# Patient Record
Sex: Male | Born: 1940
Health system: Southern US, Community
[De-identification: ages and names within clinical notes are randomized; demographics above are authoritative.]

## PROBLEM LIST (undated history)

## (undated) DIAGNOSIS — E039 Hypothyroidism, unspecified: Secondary | ICD-10-CM

## (undated) DIAGNOSIS — F319 Bipolar disorder, unspecified: Secondary | ICD-10-CM

## (undated) DIAGNOSIS — C801 Malignant (primary) neoplasm, unspecified: Secondary | ICD-10-CM

## (undated) DIAGNOSIS — G43909 Migraine, unspecified, not intractable, without status migrainosus: Secondary | ICD-10-CM

## (undated) DIAGNOSIS — I10 Essential (primary) hypertension: Secondary | ICD-10-CM

## (undated) DIAGNOSIS — R011 Cardiac murmur, unspecified: Secondary | ICD-10-CM

## (undated) DIAGNOSIS — L409 Psoriasis, unspecified: Secondary | ICD-10-CM

## (undated) DIAGNOSIS — M109 Gout, unspecified: Secondary | ICD-10-CM

## (undated) DIAGNOSIS — D693 Immune thrombocytopenic purpura: Secondary | ICD-10-CM

## (undated) HISTORY — DX: Cardiac murmur, unspecified: R01.1

## (undated) HISTORY — PX: CARDIAC SURGERY: SHX584

## (undated) HISTORY — DX: Essential (primary) hypertension: I10

## (undated) HISTORY — PX: CHOLECYSTECTOMY: SHX55

## (undated) HISTORY — DX: Psoriasis, unspecified: L40.9

## (undated) HISTORY — DX: Gout, unspecified: M10.9

## (undated) HISTORY — DX: Bipolar disorder, unspecified: F31.9

## (undated) HISTORY — DX: Immune thrombocytopenic purpura: D69.3

## (undated) HISTORY — PX: LITHOTRIPSY: SUR834

## (undated) HISTORY — DX: Migraine, unspecified, not intractable, without status migrainosus: G43.909

## (undated) HISTORY — DX: Hypothyroidism, unspecified: E03.9

## (undated) HISTORY — DX: Malignant (primary) neoplasm, unspecified: C80.1

---

## 1996-04-09 HISTORY — PX: MELANOMA EXCISION: SHX5266

## 1999-04-10 HISTORY — PX: HEMORRHOID SURGERY: SHX153

## 2006-06-10 ENCOUNTER — Ambulatory Visit: Payer: Self-pay | Admitting: Specialist

## 2007-09-12 ENCOUNTER — Ambulatory Visit: Payer: Self-pay | Admitting: General Surgery

## 2007-09-12 HISTORY — PX: COLONOSCOPY W/ POLYPECTOMY: SHX1380

## 2009-08-05 ENCOUNTER — Ambulatory Visit: Payer: Self-pay | Admitting: Gastroenterology

## 2010-05-18 ENCOUNTER — Ambulatory Visit: Payer: Self-pay | Admitting: Family Medicine

## 2012-09-16 ENCOUNTER — Ambulatory Visit: Payer: Self-pay | Admitting: General Surgery

## 2013-04-29 DIAGNOSIS — E039 Hypothyroidism, unspecified: Secondary | ICD-10-CM | POA: Diagnosis not present

## 2013-04-29 DIAGNOSIS — I10 Essential (primary) hypertension: Secondary | ICD-10-CM | POA: Diagnosis not present

## 2013-04-29 DIAGNOSIS — E785 Hyperlipidemia, unspecified: Secondary | ICD-10-CM | POA: Diagnosis not present

## 2013-06-29 DIAGNOSIS — E785 Hyperlipidemia, unspecified: Secondary | ICD-10-CM | POA: Diagnosis not present

## 2013-06-29 DIAGNOSIS — E039 Hypothyroidism, unspecified: Secondary | ICD-10-CM | POA: Diagnosis not present

## 2013-06-29 DIAGNOSIS — M109 Gout, unspecified: Secondary | ICD-10-CM | POA: Diagnosis not present

## 2013-06-29 DIAGNOSIS — I10 Essential (primary) hypertension: Secondary | ICD-10-CM | POA: Diagnosis not present

## 2013-07-24 DIAGNOSIS — I359 Nonrheumatic aortic valve disorder, unspecified: Secondary | ICD-10-CM | POA: Diagnosis not present

## 2013-07-28 DIAGNOSIS — I359 Nonrheumatic aortic valve disorder, unspecified: Secondary | ICD-10-CM | POA: Diagnosis not present

## 2013-12-10 DIAGNOSIS — E785 Hyperlipidemia, unspecified: Secondary | ICD-10-CM | POA: Diagnosis not present

## 2013-12-10 DIAGNOSIS — M109 Gout, unspecified: Secondary | ICD-10-CM | POA: Diagnosis not present

## 2013-12-10 DIAGNOSIS — I10 Essential (primary) hypertension: Secondary | ICD-10-CM | POA: Diagnosis not present

## 2013-12-10 DIAGNOSIS — E039 Hypothyroidism, unspecified: Secondary | ICD-10-CM | POA: Diagnosis not present

## 2014-01-05 DIAGNOSIS — G589 Mononeuropathy, unspecified: Secondary | ICD-10-CM | POA: Diagnosis not present

## 2014-01-05 DIAGNOSIS — E039 Hypothyroidism, unspecified: Secondary | ICD-10-CM | POA: Diagnosis not present

## 2014-01-05 DIAGNOSIS — I10 Essential (primary) hypertension: Secondary | ICD-10-CM | POA: Diagnosis not present

## 2014-01-05 DIAGNOSIS — M109 Gout, unspecified: Secondary | ICD-10-CM | POA: Diagnosis not present

## 2014-04-09 LAB — HM COLONOSCOPY

## 2014-07-15 DIAGNOSIS — I10 Essential (primary) hypertension: Secondary | ICD-10-CM | POA: Diagnosis not present

## 2014-07-15 DIAGNOSIS — E039 Hypothyroidism, unspecified: Secondary | ICD-10-CM | POA: Diagnosis not present

## 2014-07-15 DIAGNOSIS — M109 Gout, unspecified: Secondary | ICD-10-CM | POA: Diagnosis not present

## 2014-07-19 DIAGNOSIS — I10 Essential (primary) hypertension: Secondary | ICD-10-CM | POA: Diagnosis not present

## 2014-07-19 DIAGNOSIS — N189 Chronic kidney disease, unspecified: Secondary | ICD-10-CM | POA: Diagnosis not present

## 2014-07-19 DIAGNOSIS — N200 Calculus of kidney: Secondary | ICD-10-CM | POA: Diagnosis not present

## 2014-07-20 DIAGNOSIS — N200 Calculus of kidney: Secondary | ICD-10-CM | POA: Diagnosis not present

## 2014-07-26 DIAGNOSIS — M791 Myalgia: Secondary | ICD-10-CM | POA: Diagnosis not present

## 2014-07-26 DIAGNOSIS — E039 Hypothyroidism, unspecified: Secondary | ICD-10-CM | POA: Diagnosis not present

## 2014-07-26 DIAGNOSIS — N183 Chronic kidney disease, stage 3 (moderate): Secondary | ICD-10-CM | POA: Diagnosis not present

## 2014-07-30 DIAGNOSIS — N189 Chronic kidney disease, unspecified: Secondary | ICD-10-CM | POA: Diagnosis not present

## 2014-07-30 DIAGNOSIS — I10 Essential (primary) hypertension: Secondary | ICD-10-CM | POA: Diagnosis not present

## 2014-08-09 ENCOUNTER — Encounter: Payer: Self-pay | Admitting: *Deleted

## 2014-08-16 ENCOUNTER — Other Ambulatory Visit: Payer: Self-pay

## 2014-08-16 DIAGNOSIS — Z1211 Encounter for screening for malignant neoplasm of colon: Secondary | ICD-10-CM

## 2014-08-16 MED ORDER — POLYETHYLENE GLYCOL 3350 17 GM/SCOOP PO POWD: 1.0000 | Freq: Once | ORAL | Status: DC

## 2014-08-16 NOTE — Progress Notes (Signed)
Spoke with patient's wife, Madox Corkins, and reviewed all patient's medical history, allergies, and medications. Patient is scheduled for a Colonoscopy at [facility] on 08/31/14. He will be seen here in the office by Dr Bary Castilla on 08/30/14 at 9:30 am. Patient is aware to pre register with the hospital at least 2 days prior to colonoscopy. He will only take his Benazepril and Metoprolol the morning of. Miralax prescription has been sent into his pharmacy. Colonoscopy instructions have been mailed to the patient. Patient is aware of dates, time, and instructions.

## 2014-08-26 ENCOUNTER — Encounter: Payer: Self-pay | Admitting: General Surgery

## 2014-08-30 ENCOUNTER — Ambulatory Visit (INDEPENDENT_AMBULATORY_CARE_PROVIDER_SITE_OTHER): Payer: Medicare Other | Admitting: General Surgery

## 2014-08-30 ENCOUNTER — Other Ambulatory Visit: Payer: Self-pay | Admitting: General Surgery

## 2014-08-30 ENCOUNTER — Encounter: Payer: Self-pay | Admitting: General Surgery

## 2014-08-30 VITALS — BP 124/70 | HR 76 | Resp 12 | Ht 70.0 in | Wt 192.0 lb

## 2014-08-30 DIAGNOSIS — Z86010 Personal history of colonic polyps: Secondary | ICD-10-CM

## 2014-08-30 NOTE — H&P (Signed)
Patient ID: JOESEPH VERVILLE, male DOB: 12/08/1940, 74 y.o. MRN: [id number]  Chief Complaint  Patient presents with  . Other    Colonoscopy    HPI Dalton Roy is a 74 y.o. male here for follow up colonoscopy last one done in 2009. Patient states no new GI problems at this time. Bowels move daily. Patient has been working in [place], [place] since 2012.   HPI  Past Medical History  Diagnosis Date  . Hypertension   . Heart murmur   . Gout   . Migraines   . Thyroid disease     Past Surgical History  Procedure Laterality Date  . Melanoma excision  1998  . Hemorrhoid surgery  2001  . Lithotripsy    . Colonoscopy w/ polypectomy  09/12/2007    Tubular adenoma from the proximal transverse colon without atypia.    Family History  Problem Relation Age of Onset  . Colon cancer Brother   . Cancer Father     bladder cancer    Social History History  Substance Use Topics  . Smoking status: Former Smoker    Types: Cigarettes    Quit date: 04/09/1966  . Smokeless tobacco: Not on file  . Alcohol Use: Yes    No Known Allergies  Current Outpatient Prescriptions  Medication Sig Dispense Refill  . allopurinol (ZYLOPRIM) 300 MG tablet Take 300 mg by mouth daily.    Marland Kitchen ascorbic acid (VITAMIN C) 1000 MG tablet Take 1,000 mg by mouth daily.    Marland Kitchen aspirin 81 MG tablet Take 81 mg by mouth daily.    . benazepril (LOTENSIN) 40 MG tablet Take 40 mg by mouth 2 (two) times daily.    . cholecalciferol (VITAMIN D) 1000 UNITS tablet Take 1,000 Units by mouth daily.    . divalproex (DEPAKOTE) 500 MG DR tablet Take 500 mg by mouth 2 (two) times daily.     . ergotamine-caffeine (CAFERGOT) 1-100 MG per tablet Take 1 tablet by mouth every hour as needed for headache or migraine. Two tablets at onset of attack; then 1 tablet every 30 minutes as needed; maximum: 6 tablets per  attack; do not exceed 10 tablets/week    . levothyroxine (SYNTHROID, LEVOTHROID) 75 MCG tablet Take 32.5 mcg by mouth daily before breakfast.    . metoprolol succinate (TOPROL-XL) 50 MG 24 hr tablet Take 50 mg by mouth daily. Take with or immediately following a meal.    . Multiple Vitamin (MULTI VITAMIN DAILY PO) Take by mouth.    . Multiple Vitamins-Minerals (HEALTHY EYES PO) Take by mouth.    . Omega-3 Fatty Acids (FISH OIL) 1000 MG CAPS Take by mouth.    Marland Kitchen omeprazole (PRILOSEC) 20 MG capsule Take 20 mg by mouth 2 (two) times daily before a meal.    . polyethylene glycol powder (GLYCOLAX/MIRALAX) powder Take 255 g by mouth once. 255 g 0  . simvastatin (ZOCOR) 20 MG tablet Take 20 mg by mouth daily.    . verapamil (VERELAN PM) 240 MG 24 hr capsule Take 240 mg by mouth at bedtime.     No current facility-administered medications for this visit.    Review of Systems Review of Systems  Constitutional: Negative.  Respiratory: Negative.  Cardiovascular: Negative.  Gastrointestinal: Negative.    Blood pressure 124/70, pulse 76, resp. rate 12, height 5\' 10"  (1.778 m), weight 192 lb (87.091 kg).  Physical Exam Physical Exam  Constitutional: He is oriented to person, place, and time. He appears well-developed and  well-nourished.  Eyes: Pupils are equal, round, and reactive to light.  Cardiovascular: Normal rate.  Murmur heard. Pulmonary/Chest: Effort normal and breath sounds normal.  Neurological: He is alert and oriented to person, place, and time.  Skin: Skin is warm and dry.    Data Reviewed 2009 Colonoscopy and Pathology notes.  Assessment    Previous colonic polyp. No recent GI symptoms.    Plan    Colonoscopy with possible biopsy/polypectomy prn: Information regarding the procedure, including its potential risks and complications (including but not limited to perforation of the bowel, which may require emergency  surgery to repair, and bleeding) was verbally given to the patient. Educational information regarding lower instestinal endoscopy was given to the patient. Written instructions for how to complete the bowel prep using Miralax were provided. The importance of drinking ample fluids to avoid dehydration as a result of the prep emphasized.      Patient has been scheduled for a colonoscopy on 08-31-14 at [facility].  KLT:YVDPBA Golden Pop, MD

## 2014-08-30 NOTE — Progress Notes (Signed)
Patient ID: Dalton Roy, male   DOB: 1941-04-08, 74 y.o.   MRN: [id number]  Chief Complaint  Patient presents with  . Other    Colonoscopy    HPI Dalton Roy is a 74 y.o. male here for follow up colonoscopy last one done in 2009. Patient states no new GI problems at this time. Bowels move daily. Patient has been working in [place], [place] since 2012.   HPI  Past Medical History  Diagnosis Date  . Hypertension   . Heart murmur   . Gout   . Migraines   . Thyroid disease     Past Surgical History  Procedure Laterality Date  . Melanoma excision  1998  . Hemorrhoid surgery  2001  . Lithotripsy    . Colonoscopy w/ polypectomy  09/12/2007    Tubular adenoma from the proximal transverse colon without atypia.    Family History  Problem Relation Age of Onset  . Colon cancer Brother   . Cancer Father     bladder cancer    Social History History  Substance Use Topics  . Smoking status: Former Smoker    Types: Cigarettes    Quit date: 04/09/1966  . Smokeless tobacco: Not on file  . Alcohol Use: Yes    No Known Allergies  Current Outpatient Prescriptions  Medication Sig Dispense Refill  . allopurinol (ZYLOPRIM) 300 MG tablet Take 300 mg by mouth daily.    Marland Kitchen ascorbic acid (VITAMIN C) 1000 MG tablet Take 1,000 mg by mouth daily.    Marland Kitchen aspirin 81 MG tablet Take 81 mg by mouth daily.    . benazepril (LOTENSIN) 40 MG tablet Take 40 mg by mouth 2 (two) times daily.    . cholecalciferol (VITAMIN D) 1000 UNITS tablet Take 1,000 Units by mouth daily.    . divalproex (DEPAKOTE) 500 MG DR tablet Take 500 mg by mouth 2 (two) times daily.     . ergotamine-caffeine (CAFERGOT) 1-100 MG per tablet Take 1 tablet by mouth every hour as needed for headache or migraine. Two tablets at onset of attack; then 1 tablet every 30 minutes as needed; maximum: 6 tablets per attack; do not exceed 10 tablets/week    . levothyroxine (SYNTHROID, LEVOTHROID) 75 MCG tablet Take 32.5 mcg by mouth daily  before breakfast.    . metoprolol succinate (TOPROL-XL) 50 MG 24 hr tablet Take 50 mg by mouth daily. Take with or immediately following a meal.    . Multiple Vitamin (MULTI VITAMIN DAILY PO) Take by mouth.    . Multiple Vitamins-Minerals (HEALTHY EYES PO) Take by mouth.    . Omega-3 Fatty Acids (FISH OIL) 1000 MG CAPS Take by mouth.    Marland Kitchen omeprazole (PRILOSEC) 20 MG capsule Take 20 mg by mouth 2 (two) times daily before a meal.    . polyethylene glycol powder (GLYCOLAX/MIRALAX) powder Take 255 g by mouth once. 255 g 0  . simvastatin (ZOCOR) 20 MG tablet Take 20 mg by mouth daily.    . verapamil (VERELAN PM) 240 MG 24 hr capsule Take 240 mg by mouth at bedtime.     No current facility-administered medications for this visit.    Review of Systems Review of Systems  Constitutional: Negative.   Respiratory: Negative.   Cardiovascular: Negative.   Gastrointestinal: Negative.     Blood pressure 124/70, pulse 76, resp. rate 12, height $RemoveBe'5\' 10"'yMaAVUpbZ$  (1.778 m), weight 192 lb (87.091 kg).  Physical Exam Physical Exam  Constitutional: He is oriented to  person, place, and time. He appears well-developed and well-nourished.  Eyes: Pupils are equal, round, and reactive to light.  Cardiovascular: Normal rate.   Murmur heard. Pulmonary/Chest: Effort normal and breath sounds normal.  Neurological: He is alert and oriented to person, place, and time.  Skin: Skin is warm and dry.    Data Reviewed 2009 Colonoscopy and Pathology notes.  Assessment    Previous colonic polyp. No recent GI symptoms.    Plan    Colonoscopy with possible biopsy/polypectomy prn: Information regarding the procedure, including its potential risks and complications (including but not limited to perforation of the bowel, which may require emergency surgery to repair, and bleeding) was verbally given to the patient. Educational information regarding lower instestinal endoscopy was given to the patient. Written instructions for  how to complete the bowel prep using Miralax were provided. The importance of drinking ample fluids to avoid dehydration as a result of the prep emphasized.      Patient has been scheduled for a colonoscopy on 08-31-14 at [facility].  YQI:HKVQQV Golden Pop, MD  Robert Bellow 08/30/2014, 2:55 PM

## 2014-08-30 NOTE — Patient Instructions (Addendum)
Colonoscopy A colonoscopy is an exam to look at the entire large intestine (colon). This exam can help find problems such as tumors, polyps, inflammation, and areas of bleeding. The exam takes about 1 hour.  LET [facility] CARE PROVIDER KNOW ABOUT:   Any allergies you have.  All medicines you are taking, including vitamins, herbs, eye drops, creams, and over-the-counter medicines.  Previous problems you or members of your family have had with the use of anesthetics.  Any blood disorders you have.  Previous surgeries you have had.  Medical conditions you have. RISKS AND COMPLICATIONS  Generally, this is a safe procedure. However, as with any procedure, complications can occur. Possible complications include:  Bleeding.  Tearing or rupture of the colon wall.  Reaction to medicines given during the exam.  Infection (rare). BEFORE THE PROCEDURE   Ask your health care provider about changing or stopping your regular medicines.  You may be prescribed an oral bowel prep. This involves drinking a large amount of medicated liquid, starting the day before your procedure. The liquid will cause you to have multiple loose stools until your stool is almost clear or light green. This cleans out your colon in preparation for the procedure.  Do not eat or drink anything else once you have started the bowel prep, unless your health care provider tells you it is safe to do so.  Arrange for someone to drive you home after the procedure. PROCEDURE   You will be given medicine to help you relax (sedative).  You will lie on your side with your knees bent.  A long, flexible tube with a light and camera on the end (colonoscope) will be inserted through the rectum and into the colon. The camera sends video back to a computer screen as it moves through the colon. The colonoscope also releases carbon dioxide gas to inflate the colon. This helps your health care provider see the area better.  During  the exam, your health care provider may take a small tissue sample (biopsy) to be examined under a microscope if any abnormalities are found.  The exam is finished when the entire colon has been viewed. AFTER THE PROCEDURE   Do not drive for 24 hours after the exam.  You may have a small amount of blood in your stool.  You may pass moderate amounts of gas and have mild abdominal cramping or bloating. This is caused by the gas used to inflate your colon during the exam.  Ask when your test results will be ready and how you will get your results. Make sure you get your test results. Document Released: 03/23/2000 Document Revised: 01/14/2013 Document Reviewed: 12/01/2012 [facility] Patient Information 2015 [place], [place]. This information is not intended to replace advice given to you by your health care provider. Make sure you discuss any questions you have with your health care provider.  Patient has been scheduled for a colonoscopy on 08-31-14 at [facility].

## 2014-08-31 ENCOUNTER — Ambulatory Visit: Payer: Medicare Other | Admitting: Anesthesiology

## 2014-08-31 ENCOUNTER — Encounter
Admission: RE | Disposition: A | Discharge status: Home / Self Care | Payer: Self-pay | Source: Ambulatory Visit | Attending: General Surgery

## 2014-08-31 ENCOUNTER — Ambulatory Visit
Admission: RE | Admit: 2014-08-31 | Discharge: 2014-08-31 | Disposition: A | Discharge status: Home / Self Care | Payer: Medicare Other | Source: Ambulatory Visit | Attending: General Surgery | Admitting: General Surgery

## 2014-08-31 DIAGNOSIS — I10 Essential (primary) hypertension: Secondary | ICD-10-CM | POA: Insufficient documentation

## 2014-08-31 DIAGNOSIS — Z86010 Personal history of colonic polyps: Secondary | ICD-10-CM | POA: Diagnosis not present

## 2014-08-31 DIAGNOSIS — G43909 Migraine, unspecified, not intractable, without status migrainosus: Secondary | ICD-10-CM | POA: Insufficient documentation

## 2014-08-31 DIAGNOSIS — Z9889 Other specified postprocedural states: Secondary | ICD-10-CM | POA: Diagnosis not present

## 2014-08-31 DIAGNOSIS — Z7982 Long term (current) use of aspirin: Secondary | ICD-10-CM | POA: Insufficient documentation

## 2014-08-31 DIAGNOSIS — M109 Gout, unspecified: Secondary | ICD-10-CM | POA: Insufficient documentation

## 2014-08-31 DIAGNOSIS — Z800 Family history of malignant neoplasm of digestive organs: Secondary | ICD-10-CM | POA: Insufficient documentation

## 2014-08-31 DIAGNOSIS — E079 Disorder of thyroid, unspecified: Secondary | ICD-10-CM | POA: Insufficient documentation

## 2014-08-31 DIAGNOSIS — Z87891 Personal history of nicotine dependence: Secondary | ICD-10-CM | POA: Insufficient documentation

## 2014-08-31 DIAGNOSIS — Z1211 Encounter for screening for malignant neoplasm of colon: Secondary | ICD-10-CM | POA: Diagnosis not present

## 2014-08-31 DIAGNOSIS — Z79899 Other long term (current) drug therapy: Secondary | ICD-10-CM | POA: Insufficient documentation

## 2014-08-31 HISTORY — PX: COLONOSCOPY: SHX5424

## 2014-08-31 SURGERY — COLONOSCOPY
Anesthesia: General

## 2014-08-31 MED ORDER — MIDAZOLAM HCL 2 MG/2ML IJ SOLN
INTRAMUSCULAR | Status: DC | PRN
  Administered 2014-08-31: 1 mg via INTRAVENOUS

## 2014-08-31 MED ORDER — FENTANYL CITRATE (PF) 100 MCG/2ML IJ SOLN: INTRAMUSCULAR | Status: DC | PRN

## 2014-08-31 MED ORDER — PROPOFOL 10 MG/ML IV BOLUS
INTRAVENOUS | Status: DC | PRN
  Administered 2014-08-31: 80 mg via INTRAVENOUS

## 2014-08-31 MED ORDER — LIDOCAINE HCL (CARDIAC) 20 MG/ML IV SOLN
INTRAVENOUS | Status: DC | PRN
  Administered 2014-08-31: 60 mg via INTRAVENOUS

## 2014-08-31 MED ORDER — PROPOFOL INFUSION 10 MG/ML OPTIME
INTRAVENOUS | Status: DC | PRN
  Administered 2014-08-31: 160 ug/kg/min via INTRAVENOUS

## 2014-08-31 MED ORDER — SODIUM CHLORIDE 0.9 % IV SOLN
INTRAVENOUS | Status: DC
Start: 2014-08-31 — End: 2014-08-31
  Administered 2014-08-31: 1000 mL via INTRAVENOUS

## 2014-08-31 NOTE — Transfer of Care (Signed)
Immediate Anesthesia Transfer of Care Note  Patient: Dalton Roy  Procedure(s) Performed: Procedure(s): COLONOSCOPY (N/A)  Patient Location: PACU  Anesthesia Type:General  Level of Consciousness: sedated  Airway & Oxygen Therapy: Patient Spontanous Breathing and Patient connected to nasal cannula oxygen  Post-op Assessment: Report given to RN and Post -op Vital signs reviewed and stable  Post vital signs: Reviewed and stable  Last Vitals:  Filed Vitals:   08/31/14 1523  BP: 119/82  Pulse: 51  Temp: 97.0  Resp: 15    Complications: No apparent anesthesia complications

## 2014-08-31 NOTE — Anesthesia Preprocedure Evaluation (Addendum)
Anesthesia Evaluation  Patient identified by MRN, date of birth, ID band Patient awake    Reviewed: Allergy & Precautions, NPO status , Patient's Chart, lab work & pertinent test results  Airway Mallampati: II  TM Distance: >3 FB Neck ROM: Full    Dental   Pulmonary former smoker (quit 48 yrs),          Cardiovascular hypertension, Pt. on medications and Pt. on home beta blockers + Valvular Problems/Murmurs (no therapy)     Neuro/Psych  Headaches,    GI/Hepatic   Endo/Other  Hyperthyroidism   Renal/GU      Musculoskeletal   Abdominal   Peds  Hematology   Anesthesia Other Findings   Reproductive/Obstetrics                            Anesthesia Physical Anesthesia Plan  ASA: II  Anesthesia Plan: General   Post-op Pain Management:    Induction: Intravenous  Airway Management Planned: Nasal Cannula  Additional Equipment:   Intra-op Plan:   Post-operative Plan:   Informed Consent: I have reviewed the patients History and Physical, chart, labs and discussed the procedure including the risks, benefits and alternatives for the proposed anesthesia with the patient or authorized representative who has indicated his/her understanding and acceptance.     Plan Discussed with:   Anesthesia Plan Comments:         Anesthesia Quick Evaluation

## 2014-08-31 NOTE — H&P (Signed)
No change from yesterday's exam.. For colonoscopy today.

## 2014-08-31 NOTE — Op Note (Signed)
[facility] Patient Name: Dalton Roy Procedure Date: 08/31/2014 2:46 PM MRN: [id number] Account #: [id number] Date of Birth: [birth year] Admit Type: Outpatient Age: 74 Room: [facility] ENDO ROOM 1 Gender: Male Note Status: Finalized Procedure:         Colonoscopy Indications:       Screening for colorectal malignant neoplasm Providers:         Robert Bellow, MD Medicines:         Monitored Anesthesia Care Complications:     No immediate complications. Procedure:         Pre-Anesthesia Assessment:                    - Prior to the procedure, a History and Physical was                     performed, and patient medications, allergies and                     sensitivities were reviewed. The patient's tolerance of                     previous anesthesia was reviewed.                    - The risks and benefits of the procedure and the sedation                     options and risks were discussed with the patient. All                     questions were answered and informed consent was obtained.                    After obtaining informed consent, the colonoscope was                     passed under direct vision. Throughout the procedure, the                     patient's blood pressure, pulse, and oxygen saturations                     were monitored continuously. The Colonoscope was                     introduced through the anus and advanced to the the cecum,                     identified by the appendiceal orifice, ileocecal valve and                     palpation. The colonoscopy was performed without                     difficulty. The patient tolerated the procedure well. The                     quality of the bowel preparation was excellent. Findings:      The entire examined colon appeared normal on direct and retroflexion       views. Impression:        - The entire examined colon is normal on direct and  retroflexion  views.                    - No specimens collected. Recommendation:    - Repeat colonoscopy in 10 years for screening purposes. Procedure Code(s): --- Professional ---                    [phone], Colonoscopy, flexible; diagnostic, including                     collection of specimen(s) by brushing or washing, when                     performed (separate procedure) Diagnosis Code(s): --- Professional ---                    Z12.11, Encounter for screening for malignant neoplasm of                     colon CPT copyright 2014 American Medical Association. All rights reserved. The codes documented in this report are preliminary and upon coder review may  be revised to meet current compliance requirements. Robert Bellow, MD 08/31/2014 3:18:24 PM This report has been signed electronically. Number of Addenda: 0 Note Initiated On: 08/31/2014 2:46 PM Scope Withdrawal Time: 0 hours 6 minutes 0 seconds  Total Procedure Duration: 0 hours 11 minutes 48 seconds       [facility]

## 2014-08-31 NOTE — Anesthesia Postprocedure Evaluation (Signed)
  Anesthesia Post-op Note  Patient: Dalton Roy  Procedure(s) Performed: Procedure(s): COLONOSCOPY (N/A)  Anesthesia type:General  Patient location: PACU  Post pain: Pain level controlled  Post assessment: Post-op Vital signs reviewed, Patient's Cardiovascular Status Stable, Respiratory Function Stable, Patent Airway and No signs of Nausea or vomiting  Post vital signs: Reviewed and stable  Last Vitals:  Filed Vitals:   08/31/14 1600  BP: 147/86  Pulse: 50  Temp:   Resp: 18    Level of consciousness: awake, alert  and patient cooperative  Complications: No apparent anesthesia complications

## 2014-09-02 ENCOUNTER — Encounter: Payer: Self-pay | Admitting: General Surgery

## 2014-09-02 DIAGNOSIS — D485 Neoplasm of uncertain behavior of skin: Secondary | ICD-10-CM | POA: Diagnosis not present

## 2014-09-02 DIAGNOSIS — L2089 Other atopic dermatitis: Secondary | ICD-10-CM | POA: Diagnosis not present

## 2014-09-02 DIAGNOSIS — X32XXXA Exposure to sunlight, initial encounter: Secondary | ICD-10-CM | POA: Diagnosis not present

## 2014-09-02 DIAGNOSIS — Z85828 Personal history of other malignant neoplasm of skin: Secondary | ICD-10-CM | POA: Diagnosis not present

## 2014-09-02 DIAGNOSIS — L821 Other seborrheic keratosis: Secondary | ICD-10-CM | POA: Diagnosis not present

## 2014-09-02 DIAGNOSIS — L820 Inflamed seborrheic keratosis: Secondary | ICD-10-CM | POA: Diagnosis not present

## 2014-09-02 DIAGNOSIS — Z85820 Personal history of malignant melanoma of skin: Secondary | ICD-10-CM | POA: Diagnosis not present

## 2014-09-02 DIAGNOSIS — L570 Actinic keratosis: Secondary | ICD-10-CM | POA: Diagnosis not present

## 2014-09-24 DIAGNOSIS — I514 Myocarditis, unspecified: Secondary | ICD-10-CM | POA: Diagnosis not present

## 2014-09-24 DIAGNOSIS — K649 Unspecified hemorrhoids: Secondary | ICD-10-CM | POA: Diagnosis not present

## 2014-09-24 DIAGNOSIS — M109 Gout, unspecified: Secondary | ICD-10-CM | POA: Diagnosis not present

## 2014-09-24 DIAGNOSIS — D693 Immune thrombocytopenic purpura: Secondary | ICD-10-CM | POA: Diagnosis not present

## 2015-01-08 DIAGNOSIS — Z23 Encounter for immunization: Secondary | ICD-10-CM | POA: Diagnosis not present

## 2015-01-27 ENCOUNTER — Ambulatory Visit (INDEPENDENT_AMBULATORY_CARE_PROVIDER_SITE_OTHER): Payer: Medicare Other | Admitting: Primary Care

## 2015-01-27 ENCOUNTER — Encounter: Payer: Self-pay | Admitting: Primary Care

## 2015-01-27 VITALS — BP 128/88 | HR 74 | Temp 97.8°F | Ht 70.0 in | Wt 193.8 lb

## 2015-01-27 DIAGNOSIS — E039 Hypothyroidism, unspecified: Secondary | ICD-10-CM | POA: Diagnosis not present

## 2015-01-27 DIAGNOSIS — M109 Gout, unspecified: Secondary | ICD-10-CM | POA: Diagnosis not present

## 2015-01-27 DIAGNOSIS — I10 Essential (primary) hypertension: Secondary | ICD-10-CM | POA: Diagnosis not present

## 2015-01-27 DIAGNOSIS — R51 Headache: Secondary | ICD-10-CM | POA: Diagnosis not present

## 2015-01-27 DIAGNOSIS — R519 Headache, unspecified: Secondary | ICD-10-CM

## 2015-01-27 MED ORDER — METOPROLOL SUCCINATE ER 50 MG PO TB24: 50.0000 mg | ORAL_TABLET | Freq: Every day | ORAL | Status: DC

## 2015-01-27 NOTE — Progress Notes (Signed)
Subjective:    Patient ID: Dalton Roy, male    DOB: 1940-12-16, 74 y.o.   MRN: [id number]  HPI  Dalton Roy is a 75 year old male who presents today to establish care and discuss the problems mentioned below. Will obtain old records.  1) Essential Hypertension: Diagnosed 13-14 years. Currently managed on benazepril 40 mg and metoprolol succinate 50 mg. He checks his blood pressure at home and will get readings of 120's-130's/70-80's. Denies chest pain and shortness of breath.  BP Readings from Last 3 Encounters:  01/27/15 128/88  08/31/14 147/86  08/30/14 124/70     2) Hypothyroidism: Currently managed on levothyroxine 75 mcg. His last TSH was about 6 months ago and reports normal levels.   3) Migraines: Currently managed on ergotamine-caffeine 1-100 mg as needed and depakote 500 mg daily. He's currently taking tylenol as well for increased headaches. He has an appointment with an optometrist next week as he's noticed changes in his vision. Typically will get migraines about once every other week and will develop photophobia and phonophobia.   4) Gout: Diagnosed about 7 years. Currently managed on allopurinol 300 mg. Endorses normal uric acid 6 months ago. No recent flares.  Review of Systems  Constitutional: Negative for unexpected weight change.  HENT: Negative for rhinorrhea.   Respiratory: Negative for cough and shortness of breath.   Cardiovascular: Negative for chest pain.  Gastrointestinal: Negative for diarrhea and constipation.  Genitourinary: Negative for difficulty urinating.  Musculoskeletal: Negative for myalgias and arthralgias.       Chronic neck pain  Skin: Negative for rash.  Allergic/Immunologic: Positive for environmental allergies.  Neurological: Positive for dizziness and headaches.       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        Past Medical History    Diagnosis Date  . Hypertension   . Heart murmur   . Gout   . Migraines   . Thyroid disease   . Cancer ([place]) 20 years    skin  . Psoriasis     Social History   Social History  . Marital Status: Married    Spouse Name: N/A  . Number of Children: N/A  . Years of Education: N/A   Occupational History  . Not on file.   Social History Main Topics  . Smoking status: Former Smoker    Types: Cigarettes    Quit date: 04/09/1966  . Smokeless tobacco: Not on file  . Alcohol Use: 0.0 oz/week    0 Standard drinks or equivalent per week  . Drug Use: No  . Sexual Activity: Not on file   Other Topics Concern  . Not on file   Social History Narrative   Married.   [place] veteran.   Moved back to [place] from [place].        Past Surgical History  Procedure Laterality Date  . Melanoma excision  1998  . Hemorrhoid surgery  2001  . Lithotripsy    . Colonoscopy w/ polypectomy  09/12/2007    Tubular adenoma from the proximal transverse colon without atypia.  . Colonoscopy N/A 08/31/2014    Procedure: COLONOSCOPY;  Surgeon: Robert Bellow, MD;  Location: [facility] ENDOSCOPY;  Service: Endoscopy;  Laterality: N/A;    Family History  Problem Relation Age of Onset  . Colon cancer Brother     Half brother  . Cancer Father  bladder cancer    No Known Allergies  Current Outpatient Prescriptions on File Prior to Visit  Medication Sig Dispense Refill  . allopurinol (ZYLOPRIM) 300 MG tablet Take 300 mg by mouth daily.    . benazepril (LOTENSIN) 40 MG tablet Take 40 mg by mouth 2 (two) times daily.    . divalproex (DEPAKOTE) 500 MG DR tablet Take 500 mg by mouth 2 (two) times daily.     Marland Kitchen levothyroxine (SYNTHROID, LEVOTHROID) 75 MCG tablet Take 32.5 mcg by mouth daily before breakfast.     No current facility-administered medications on file prior to visit.    BP 128/88 mmHg  Pulse 74  Temp(Src) 97.8 F (36.6 C) (Oral)  Ht $R'5\' 10"'vN$  (1.778 m)  Wt 193 lb 12.8 oz (87.907 kg)   BMI 27.81 kg/m2  SpO2 96%    Objective:   Physical Exam  Constitutional: He is oriented to person, place, and time. He appears well-nourished.  Cardiovascular: Normal rate and regular rhythm.   Pulmonary/Chest: Effort normal and breath sounds normal.  Neurological: He is alert and oriented to person, place, and time.  Skin: Skin is warm and dry.  Psychiatric: He has a normal mood and affect.          Assessment & Plan:

## 2015-01-27 NOTE — Assessment & Plan Note (Signed)
Managed on Allopurinol 300 mg daily. No recent flares. Last uric acid level was normal per patient, 6 months ago. Continue same.

## 2015-01-27 NOTE — Assessment & Plan Note (Signed)
Longstanding history of. Currently managed on Depakote 500 mg and ergotamine-caffeine 1-100 mg PRN. Going for evaluation of increased headaches and some vision changes. Will continue to monitor.

## 2015-01-27 NOTE — Patient Instructions (Signed)
Follow up in 6 months for Medicare Wellness Visit.  It was a pleasure to meet you today! Please don't hesitate to call me with any questions. Welcome to [facility]!

## 2015-01-27 NOTE — Assessment & Plan Note (Signed)
Managed on levothyroxine 75 mcg. Last TSH normal per patient, 6 months ago. Will obtain old records.

## 2015-01-27 NOTE — Assessment & Plan Note (Signed)
Stable today in clinic today after recheck. Managed on Benazepril 40 mg and Toprol XL 50 mg from prior PCP. Long standing history of dizziness per patient, no change with Toprol XL. Will continue to monitor.

## 2015-01-27 NOTE — Progress Notes (Signed)
Pre visit review using our clinic review tool, if applicable. No additional management support is needed unless otherwise documented below in the visit note. 

## 2015-01-28 ENCOUNTER — Encounter: Payer: Self-pay | Admitting: Primary Care

## 2015-02-10 ENCOUNTER — Other Ambulatory Visit: Payer: Self-pay

## 2015-02-10 MED ORDER — BENAZEPRIL HCL 40 MG PO TABS: 40.0000 mg | ORAL_TABLET | Freq: Two times a day (BID) | ORAL | Status: DC

## 2015-02-10 NOTE — Telephone Encounter (Signed)
Pt left note requesting refill benazepril to [facility]. Per 01/27/15 office note; listed no change with Toprol XL and that med was refilled. Do you want pt to continue taking Benzaepril also.Please advise.pt will be out of med on 02/12/15.

## 2015-02-11 NOTE — Telephone Encounter (Signed)
Left v/m for pt per DPR that rx was sent to [place].

## 2015-02-17 DIAGNOSIS — Z961 Presence of intraocular lens: Secondary | ICD-10-CM | POA: Diagnosis not present

## 2015-02-17 DIAGNOSIS — H02834 Dermatochalasis of left upper eyelid: Secondary | ICD-10-CM | POA: Diagnosis not present

## 2015-02-17 DIAGNOSIS — G43B0 Ophthalmoplegic migraine, not intractable: Secondary | ICD-10-CM | POA: Diagnosis not present

## 2015-02-17 DIAGNOSIS — H02831 Dermatochalasis of right upper eyelid: Secondary | ICD-10-CM | POA: Diagnosis not present

## 2015-02-17 DIAGNOSIS — H40013 Open angle with borderline findings, low risk, bilateral: Secondary | ICD-10-CM | POA: Diagnosis not present

## 2015-03-10 ENCOUNTER — Other Ambulatory Visit: Payer: Self-pay

## 2015-03-10 MED ORDER — LEVOTHYROXINE SODIUM 75 MCG PO TABS: 32.5000 ug | ORAL_TABLET | Freq: Every day | ORAL | Status: DC

## 2015-03-10 NOTE — Telephone Encounter (Signed)
Pt left note requesting refill levothyroxine to [facility] [address]. Pt established care 01/27/15; last scanned T4 free done 07/26/14. Refilled per protocol and pt notified done.

## 2015-04-21 ENCOUNTER — Other Ambulatory Visit: Payer: Self-pay

## 2015-04-21 MED ORDER — DIVALPROEX SODIUM 500 MG PO DR TAB: 500.0000 mg | DELAYED_RELEASE_TABLET | Freq: Two times a day (BID) | ORAL | Status: DC

## 2015-04-21 MED ORDER — ERGOTAMINE-CAFFEINE 1-100 MG PO TABS: 2.0000 | ORAL_TABLET | ORAL | Status: DC | PRN

## 2015-04-21 MED ORDER — ALLOPURINOL 300 MG PO TABS: 300.0000 mg | ORAL_TABLET | Freq: Every day | ORAL | Status: DC

## 2015-04-21 NOTE — Addendum Note (Signed)
Addended by: Jacqualin Combes on: 04/21/2015 04:27 PM   Modules accepted: Orders

## 2015-04-21 NOTE — Telephone Encounter (Addendum)
Called and spoken to patient. Patient stated that he also had his primary provider prescribed his Depakote and patient was very determine to get that point across. He have not seen to Psychiatry in years. Informed Anda Kraft and ok per Anda Kraft to refill Depakote to [facility].

## 2015-04-21 NOTE — Telephone Encounter (Signed)
Pt left note requesting refill allopurinol (per 01/27/15 note pt was to continue; pt to have appt 07/2015; refilled per protocol). Pt also request Depakote to [facility] [address] And Cafergot to [employer].Pt established care 01/27/15; Allie Bossier NP has not filled these meds previously.Please advise. Pt needs refills by 04/22/2015.

## 2015-04-21 NOTE — Telephone Encounter (Signed)
Phone in the Cafergot to [employer].

## 2015-05-06 ENCOUNTER — Other Ambulatory Visit: Payer: Self-pay | Admitting: *Deleted

## 2015-05-06 MED ORDER — BENAZEPRIL HCL 40 MG PO TABS: 40.0000 mg | ORAL_TABLET | Freq: Two times a day (BID) | ORAL | Status: DC

## 2015-05-09 ENCOUNTER — Ambulatory Visit: Payer: Medicare Other | Admitting: Primary Care

## 2015-05-10 ENCOUNTER — Other Ambulatory Visit: Payer: Self-pay | Admitting: Primary Care

## 2015-05-10 ENCOUNTER — Ambulatory Visit (INDEPENDENT_AMBULATORY_CARE_PROVIDER_SITE_OTHER): Payer: Medicare Other | Admitting: Primary Care

## 2015-05-10 ENCOUNTER — Encounter: Payer: Self-pay | Admitting: Primary Care

## 2015-05-10 VITALS — BP 146/86 | HR 67 | Temp 98.0°F | Ht 70.0 in | Wt 209.8 lb

## 2015-05-10 DIAGNOSIS — M1990 Unspecified osteoarthritis, unspecified site: Secondary | ICD-10-CM | POA: Diagnosis not present

## 2015-05-10 NOTE — Assessment & Plan Note (Signed)
Present to neck. Also now with pain to left shoulder, stiffness. Pain mostly located to left scapular region with ROM. Once received cortisone injections. Will have him evaluated by Dr. Lorelei Pont for further evaluation and assess need for injections.
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
## 2015-05-10 NOTE — Patient Instructions (Signed)
Obtain a mouth guard to use during sleep as your jaw pain is likely caused by grinding your teeth at night. Please notify me if no improvement in discomfort and popping in 2-3 weeks.  Schedule an appointment with Dr. Lorelei Pont for evaluation of chronic shoulder and neck pain.   Please schedule a physical with me within the next 3 months. You may also schedule a lab only appointment 3-4 days prior. We will discuss your lab results in detail during your physical.  It was a pleasure to see you today!

## 2015-05-10 NOTE — Progress Notes (Signed)
Pre visit review using our clinic review tool, if applicable. No additional management support is needed unless otherwise documented below in the visit note. 

## 2015-05-16 ENCOUNTER — Encounter: Payer: Self-pay | Admitting: Family Medicine

## 2015-05-16 ENCOUNTER — Ambulatory Visit (INDEPENDENT_AMBULATORY_CARE_PROVIDER_SITE_OTHER): Payer: Medicare Other | Admitting: Family Medicine

## 2015-05-16 VITALS — BP 150/94 | HR 80 | Temp 97.6°F | Ht 70.0 in | Wt 203.2 lb

## 2015-05-16 DIAGNOSIS — G629 Polyneuropathy, unspecified: Secondary | ICD-10-CM | POA: Diagnosis not present

## 2015-05-16 DIAGNOSIS — M542 Cervicalgia: Secondary | ICD-10-CM | POA: Diagnosis not present

## 2015-05-16 MED ORDER — GABAPENTIN 300 MG PO CAPS: 300.0000 mg | ORAL_CAPSULE | Freq: Three times a day (TID) | ORAL | Status: DC

## 2015-05-16 NOTE — Patient Instructions (Signed)
Generic Gabapentin Titration Schedule  Generic Gabapentin (generic form of Neurontin) comes in 300 mg tablets or capsules.   You have to titrate your dose slowly to reduce side effects and reduce sedation / sleepiness.    Week               Breakfast  Lunch   Dinner One                 0   0   300 mg Two   '300mg'$    0   '300mg'$  Three  $Remo'300mg'ZGJpu$    '300mg'$    '300mg'$  Four   '300mg'$    '300mg'$    '600mg'$  (2 tabs) Five   '600mg'$  (2 tabs) $Remove'300mg'joJuWlg$    '600mg'$  (2 tabs) Six   '600mg'$  (2 tabs) $Remove'600mg'JLefLjy$  (2 tabs) $Remove'600mg'TWbjzZO$  (2 tabs) Seven  $Remo'600mg'nVtgD$  (2 tabs) $Remove'600mg'rDwiFxZ$  (2 tabs) $Remove'900mg'CqOhvwe$  (3 tabs) Eight   '900mg'$  (3 tabs) $Remove'600mg'aVQQEfe$  (2 tabs) $Remove'900mg'rJkuPeR$  (3 tabs)  If you have any problems at any time, drop back to the previous dosing schedule. Continue with this dose for 1 week, and then try to go up the next step again.

## 2015-05-16 NOTE — Progress Notes (Signed)
Pre visit review using our clinic review tool, if applicable. No additional management support is needed unless otherwise documented below in the visit note. 
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
## 2015-05-26 DIAGNOSIS — M542 Cervicalgia: Secondary | ICD-10-CM | POA: Diagnosis not present

## 2015-05-30 DIAGNOSIS — M542 Cervicalgia: Secondary | ICD-10-CM | POA: Diagnosis not present

## 2015-06-03 DIAGNOSIS — M542 Cervicalgia: Secondary | ICD-10-CM | POA: Diagnosis not present

## 2015-06-07 DIAGNOSIS — M542 Cervicalgia: Secondary | ICD-10-CM | POA: Diagnosis not present

## 2015-06-09 DIAGNOSIS — M542 Cervicalgia: Secondary | ICD-10-CM | POA: Diagnosis not present

## 2015-06-14 DIAGNOSIS — M542 Cervicalgia: Secondary | ICD-10-CM | POA: Diagnosis not present

## 2015-06-16 DIAGNOSIS — M542 Cervicalgia: Secondary | ICD-10-CM | POA: Diagnosis not present

## 2015-06-27 ENCOUNTER — Encounter: Payer: Self-pay | Admitting: Family Medicine

## 2015-06-27 ENCOUNTER — Ambulatory Visit (INDEPENDENT_AMBULATORY_CARE_PROVIDER_SITE_OTHER): Payer: Medicare Other | Admitting: Family Medicine

## 2015-06-27 VITALS — BP 128/90 | HR 73 | Temp 98.3°F | Ht 70.0 in | Wt 207.5 lb

## 2015-06-27 DIAGNOSIS — M542 Cervicalgia: Secondary | ICD-10-CM | POA: Diagnosis not present

## 2015-06-27 NOTE — Progress Notes (Signed)
Pre visit review using our clinic review tool, if applicable. No additional management support is needed unless otherwise documented below in the visit note. 
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
## 2015-07-22 ENCOUNTER — Other Ambulatory Visit: Payer: Self-pay | Admitting: Primary Care

## 2015-08-04 ENCOUNTER — Other Ambulatory Visit: Payer: Self-pay | Admitting: Primary Care

## 2015-08-04 DIAGNOSIS — E785 Hyperlipidemia, unspecified: Secondary | ICD-10-CM

## 2015-08-04 DIAGNOSIS — M109 Gout, unspecified: Secondary | ICD-10-CM

## 2015-08-04 DIAGNOSIS — I10 Essential (primary) hypertension: Secondary | ICD-10-CM

## 2015-08-04 DIAGNOSIS — E039 Hypothyroidism, unspecified: Secondary | ICD-10-CM

## 2015-08-11 ENCOUNTER — Other Ambulatory Visit (INDEPENDENT_AMBULATORY_CARE_PROVIDER_SITE_OTHER): Payer: Medicare Other

## 2015-08-11 DIAGNOSIS — E785 Hyperlipidemia, unspecified: Secondary | ICD-10-CM

## 2015-08-11 DIAGNOSIS — M109 Gout, unspecified: Secondary | ICD-10-CM | POA: Diagnosis not present

## 2015-08-11 DIAGNOSIS — E039 Hypothyroidism, unspecified: Secondary | ICD-10-CM | POA: Diagnosis not present

## 2015-08-11 DIAGNOSIS — I10 Essential (primary) hypertension: Secondary | ICD-10-CM

## 2015-08-11 LAB — TSH: TSH: 5 u[IU]/mL — ABNORMAL HIGH (ref 0.35–4.50)

## 2015-08-11 LAB — COMPREHENSIVE METABOLIC PANEL
ALK PHOS: 41 U/L (ref 39–117)
ALT: 20 U/L (ref 0–53)
AST: 27 U/L (ref 0–37)
Albumin: 4.1 g/dL (ref 3.5–5.2)
BILIRUBIN TOTAL: 0.7 mg/dL (ref 0.2–1.2)
BUN: 20 mg/dL (ref 6–23)
CALCIUM: 9.2 mg/dL (ref 8.4–10.5)
CO2: 28 mEq/L (ref 19–32)
Chloride: 105 mEq/L (ref 96–112)
Creatinine, Ser: 1.32 mg/dL (ref 0.40–1.50)
GFR: 56.36 mL/min — AB (ref >60.00)
Glucose, Bld: 88 mg/dL (ref 70–99)
Potassium: 4.7 mEq/L (ref 3.5–5.1)
Sodium: 139 mEq/L (ref 135–145)
TOTAL PROTEIN: 7 g/dL (ref 6.0–8.3)

## 2015-08-11 LAB — LIPID PANEL
CHOL/HDL RATIO: 5
CHOLESTEROL: 179 mg/dL (ref 0–200)
HDL: 34.4 mg/dL — AB (ref >39.00)
LDL CALC: 107 mg/dL — AB (ref 0–99)
NonHDL: 144.67
TRIGLYCERIDES: 189 mg/dL — AB (ref 0.0–149.0)
VLDL: 37.8 mg/dL (ref 0.0–40.0)

## 2015-08-11 LAB — URIC ACID: Uric Acid, Serum: 4.9 mg/dL (ref 4.0–7.8)

## 2015-08-15 ENCOUNTER — Other Ambulatory Visit: Payer: Self-pay | Admitting: Primary Care

## 2015-08-17 ENCOUNTER — Ambulatory Visit: Payer: Medicare Other | Admitting: Primary Care

## 2015-08-19 ENCOUNTER — Other Ambulatory Visit: Payer: Self-pay | Admitting: Primary Care

## 2015-08-24 ENCOUNTER — Ambulatory Visit (INDEPENDENT_AMBULATORY_CARE_PROVIDER_SITE_OTHER): Payer: Medicare Other | Admitting: Primary Care

## 2015-08-24 VITALS — BP 138/86 | HR 54 | Temp 98.2°F | Ht 70.0 in | Wt 202.4 lb

## 2015-08-24 DIAGNOSIS — Z0000 Encounter for general adult medical examination without abnormal findings: Secondary | ICD-10-CM | POA: Insufficient documentation

## 2015-08-24 DIAGNOSIS — Z860100 Personal history of colon polyps, unspecified: Secondary | ICD-10-CM

## 2015-08-24 DIAGNOSIS — M109 Gout, unspecified: Secondary | ICD-10-CM

## 2015-08-24 DIAGNOSIS — Z23 Encounter for immunization: Secondary | ICD-10-CM | POA: Diagnosis not present

## 2015-08-24 DIAGNOSIS — E039 Hypothyroidism, unspecified: Secondary | ICD-10-CM

## 2015-08-24 DIAGNOSIS — I10 Essential (primary) hypertension: Secondary | ICD-10-CM

## 2015-08-24 MED ORDER — ALLOPURINOL 300 MG PO TABS: ORAL_TABLET | ORAL | Status: DC

## 2015-08-24 MED ORDER — METOPROLOL SUCCINATE ER 50 MG PO TB24: ORAL_TABLET | ORAL | Status: DC

## 2015-08-24 MED ORDER — LEVOTHYROXINE SODIUM 75 MCG PO TABS: 32.5000 ug | ORAL_TABLET | Freq: Every day | ORAL | Status: DC

## 2015-08-24 MED ORDER — BENAZEPRIL HCL 40 MG PO TABS: 40.0000 mg | ORAL_TABLET | Freq: Two times a day (BID) | ORAL | Status: DC

## 2015-08-24 NOTE — Assessment & Plan Note (Signed)
Uric acid level stable.

## 2015-08-24 NOTE — Progress Notes (Signed)
Patient ID: Dalton Roy, male   DOB: [birth year], 76 y.o.   MRN: [id number]  HPI: Dalton Roy is a 75 year old male who presents today for his Annual Medicare Wellness Exam.  Past Medical History  Diagnosis Date  . Hypertension   . Heart murmur   . Gout   . Migraines   . Hypothyroidism   . Cancer ([place]) 20 years    skin  . Psoriasis   . ITP (idiopathic thrombocytopenic purpura)   . Bipolar 1 disorder ([place])     Current Outpatient Prescriptions  Medication Sig Dispense Refill  . allopurinol (ZYLOPRIM) 300 MG tablet TAKE 1 TABLET(300 MG) BY MOUTH DAILY 30 tablet 0  . benazepril (LOTENSIN) 40 MG tablet Take 1 tablet (40 mg total) by mouth 2 (two) times daily. 90 tablet 2  . divalproex (DEPAKOTE) 500 MG DR tablet TAKE 1 TABLET(500 MG) BY MOUTH TWICE DAILY 60 tablet 0  . ergotamine-caffeine (CAFERGOT) 1-100 MG tablet Take 1 tablet by mouth. at onset of attack    . levothyroxine (SYNTHROID, LEVOTHROID) 75 MCG tablet Take 0.5 tablets (37.5 mcg total) by mouth daily before breakfast. 45 tablet 1  . metoprolol succinate (TOPROL-XL) 50 MG 24 hr tablet TAKE 1 TABLET BY MOUTH EVERY DAY WITH OR IMMEDIATELY FOLLOWING A MEAL 30 tablet 0   No current facility-administered medications for this visit.    No Known Allergies  Family History  Problem Relation Age of Onset  . Colon cancer Brother     Half brother  . Cancer Father     bladder cancer    Social History   Social History  . Marital Status: Married    Spouse Name: N/A  . Number of Children: N/A  . Years of Education: N/A   Occupational History  . Not on file.   Social History Main Topics  . Smoking status: Former Smoker    Types: Cigarettes    Quit date: 04/09/1966  . Smokeless tobacco: Former Systems developer  . Alcohol Use: 0.0 oz/week    0 Standard drinks or equivalent per week  . Drug Use: No  . Sexual Activity: Not on file   Other Topics Concern  . Not on file   Social History Narrative   Married.   [place] veteran.    Moved back to [place] from [place].        Hospitiliaztions: None  Health Maintenance:    Flu: Completed in October 2016  Tetanus: Believes it's been within 4 years.   Pneumovax: Completed in January 2013  Prevnar: Never completed, due today.  Zostavax: Never completed, declines  Colonoscopy: Completed in 2016  Eye Doctor: Completed in November 2016, glaucoma  Dental Exam: Due this summer       Providers: Alma Friendly, PCP; Dr. Bary Castilla, GI; Dr. Evorn Gong, Dermatologist; Dr. Risa Grill, Urologist; Dr. Katy Fitch, Optometry; Dr. Terance Hart, Dentist; Dr. Paulla Dolly, Podiatry; Dr. Clovis Riley; Dental Surgeon; Dewaine Oats, Therapist   I have personally reviewed and have noted: 1. The patient's medical and social history 2. Their use of alcohol, tobacco or illicit drugs 3. Their current medications and supplements 4. The patient's functional ability including ADL's, fall risks, home safety risks and  hearing or visual impairment. 5. Diet and physical activities 6. Evidence for depression or mood disorder  Subjective:   Review of Systems:   Constitutional: Denies fever, malaise, fatigue, headache or abrupt weight changes.  HEENT: Denies eye pain, eye redness, ear pain, wax buildup, nasal congestion, bloody nose, or sore throat. Respiratory: Denies  difficulty breathing, shortness of breath, cough or sputum production.   Cardiovascular: Denies chest pain, chest tightness, palpitations or swelling in the hands or feet.  Gastrointestinal: Denies bloating, constipation, diarrhea or blood in the stool. he does endorse LLQ abdominal discomfort upon palpation only when lying.  GU: Denies urgency, frequency, pain with urination, burning sensation, blood in urine, odor or discharge. Musculoskeletal: Denies decrease in range of motion, difficulty with gait, muscle pain or joint pain and swelling.  Skin: Denies redness, rashes, lesions or ulcercations.  Neurological: Denies difficulty with memory, difficulty with  speech or problems with balance and coordination. He does have occasional dizziness with changes in position, dizziness improves with sinus medication.   No other specific complaints in a complete review of systems (except as listed in HPI above).  Objective:  PE:   Ht $R'5\' 10"'ls$  (1.778 m)  Wt 202 lb 6.4 oz (91.808 kg)  BMI 29.04 kg/m2 Wt Readings from Last 3 Encounters:  08/24/15 202 lb 6.4 oz (91.808 kg)  06/27/15 207 lb 8 oz (94.121 kg)  05/16/15 203 lb 4 oz (92.194 kg)    General: Appears their stated age, well developed, well nourished in NAD. Skin: Warm, dry and intact. No rashes, lesions or ulcerations noted. HEENT: Head: normal shape and size; Eyes: sclera white, no icterus, conjunctiva pink, PERRLA and EOMs intact; Ears: Tm's gray and intact, normal light reflex; Nose: mucosa pink and moist, septum midline; Throat/Mouth: Teeth present, mucosa pink and moist, no exudate, lesions or ulcerations noted.  Neck: Normal range of motion. Neck supple, trachea midline. No massses, lumps or thyromegaly present.  Cardiovascular: Normal rate and rhythm. S1,S2 noted.  No murmur, rubs or gallops noted. No JVD or BLE edema. No carotid bruits noted. Pulmonary/Chest: Normal effort and positive vesicular breath sounds. No respiratory distress. No wheezes, rales or ronchi noted.  Abdomen: Soft. Tender upon deep palpation to LLQ in supine position. Normal bowel sounds, no bruits noted. No distention or masses noted. Liver, spleen and kidneys non palpable. No evidence of hernia. Musculoskeletal: Normal range of motion. No signs of joint swelling. No difficulty with gait.  Neurological: Alert and oriented. Cranial nerves II-XII intact. Coordination normal. +DTRs bilaterally. Psychiatric: Mood and affect normal. Behavior is normal. Judgment and thought content normal.     BMET    Component Value Date/Time   NA 139 08/11/2015 0905   K 4.7 08/11/2015 0905   CL 105 08/11/2015 0905   CO2 28 08/11/2015  0905   GLUCOSE 88 08/11/2015 0905   BUN 20 08/11/2015 0905   CREATININE 1.32 08/11/2015 0905   CALCIUM 9.2 08/11/2015 0905    Lipid Panel     Component Value Date/Time   CHOL 179 08/11/2015 0905   TRIG 189.0* 08/11/2015 0905   HDL 34.40* 08/11/2015 0905   CHOLHDL 5 08/11/2015 0905   VLDL 37.8 08/11/2015 0905   LDLCALC 107* 08/11/2015 0905    CBC No results found for: WBC, RBC, HGB, HCT, PLT, MCV, MCH, MCHC, RDW, LYMPHSABS, MONOABS, EOSABS, BASOSABS  Hgb A1C No results found for: HGBA1C    Assessment and Plan:   Medicare Annual Wellness Visit:  Diet: He endorses a fair diet. Breakfast: Boost Lunch: Sandwiches, frozen dinners Dinner: Vegetables, fried fish, potatoes, pasta Snacks: None Desserts: Occasionally Beverages: Water, orange soda Physical activity: Active but does not currently exercise. Depression/mood screen: Recently under the care of a therapist and diagnosed with PTSD at the [facility] PHQ 9 score of 11.  Hearing:  Intact to whispered voice Visual acuity: Grossly normal, performs annual eye exam  ADLs: Capable Fall risk: None Home safety: Good Cognitive evaluation: Intact to orientation, naming, recall and repetition EOL planning: Adv directives, full code/ I agree  Preventative Medicine: Td, Pneumovax 23 UTD. Follows with Urology for PSA. Prevnar 13 provided today. Declines Zostavax, although high recommended. Colonoscopy in 2016 normal. Discussed the importance of a healthy diet and regular exercise in order for weight loss and to reduce risk of other medical diseases. He is currently following with a therapist through the [facility] [place] for PTSD which he finds helpful. He declines further work up for abdominal discomfort. Colonoscopy did not show evidence of diverticulitis. No alarm signs.    Next appointment: Follows with [facility] regularly for most of his care. Follow up in 1 year for repeat physical.

## 2015-08-24 NOTE — Assessment & Plan Note (Signed)
TSH slightly above goal, will have him take full 75 mcg tablet rather than half. Repeat TSH in 6 weeks.

## 2015-08-24 NOTE — Patient Instructions (Signed)
Increase your levothyroxine to 1 full tablet every day.   Schedule a lab only appointment in 6 weeks for re-evaluation.  Reduce consumption of starchy and fried foods in order to reduce triglycerides.  Start Fish Oil 1000 mg daily.  Follow up in 1 year for repeat physical or sooner if needed.  It was a pleasure to see you today!  Food Choices to Lower Your Triglycerides Triglycerides are a type of fat in your blood. High levels of triglycerides can increase the risk of heart disease and stroke. If your triglyceride levels are high, the foods you eat and your eating habits are very important. Choosing the right foods can help lower your triglycerides.  WHAT GENERAL GUIDELINES DO I NEED TO FOLLOW?  Lose weight if you are overweight.   Limit or avoid alcohol.   Fill one half of your plate with vegetables and green salads.   Limit fruit to two servings a day. Choose fruit instead of juice.   Make one fourth of your plate whole grains. Look for the word "whole" as the first word in the ingredient list.  Fill one fourth of your plate with lean protein foods.  Enjoy fatty fish (such as salmon, mackerel, sardines, and tuna) three times a week.   Choose healthy fats.   Limit foods high in starch and sugar.  Eat more home-cooked food and less restaurant, buffet, and fast food.  Limit fried foods.  Cook foods using methods other than frying.  Limit saturated fats.  Check ingredient lists to avoid foods with partially hydrogenated oils (trans fats) in them. WHAT FOODS CAN I EAT?  Grains Whole grains, such as whole wheat or whole grain breads, crackers, cereals, and pasta. Unsweetened oatmeal, bulgur, barley, quinoa, or brown rice. Corn or whole wheat flour tortillas.  Vegetables Fresh or frozen vegetables (raw, steamed, roasted, or grilled). Green salads. Fruits All fresh, canned (in natural juice), or frozen fruits. Meat and Other Protein Products Ground beef (85% or  leaner), grass-fed beef, or beef trimmed of fat. Skinless chicken or [place]. Ground chicken or [place]. Pork trimmed of fat. All fish and seafood. Eggs. Dried beans, peas, or lentils. Unsalted nuts or seeds. Unsalted canned or dry beans. Dairy Low-fat dairy products, such as skim or 1% milk, 2% or reduced-fat cheeses, low-fat ricotta or cottage cheese, or plain low-fat yogurt. Fats and Oils Tub margarines without trans fats. Light or reduced-fat mayonnaise and salad dressings. Avocado. Safflower, olive, or canola oils. Natural peanut or almond butter. The items listed above may not be a complete list of recommended foods or beverages. Contact your dietitian for more options. WHAT FOODS ARE NOT RECOMMENDED?  Grains White bread. White pasta. White rice. Cornbread. Bagels, pastries, and croissants. Crackers that contain trans fat. Vegetables White potatoes. Corn. Creamed or fried vegetables. Vegetables in a cheese sauce. Fruits Dried fruits. Canned fruit in light or heavy syrup. Fruit juice. Meat and Other Protein Products Fatty cuts of meat. Ribs, chicken wings, bacon, sausage, bologna, salami, chitterlings, fatback, hot dogs, bratwurst, and packaged luncheon meats. Dairy Whole or 2% milk, cream, half-and-half, and cream cheese. Whole-fat or sweetened yogurt. Full-fat cheeses. Nondairy creamers and whipped toppings. Processed cheese, cheese spreads, or cheese curds. Sweets and Desserts Corn syrup, sugars, honey, and molasses. Candy. Jam and jelly. Syrup. Sweetened cereals. Cookies, pies, cakes, donuts, muffins, and ice cream. Fats and Oils Butter, stick margarine, lard, shortening, ghee, or bacon fat. Coconut, palm kernel, or palm oils. Beverages Alcohol. Sweetened drinks (such as sodas,  lemonade, and fruit drinks or punches). The items listed above may not be a complete list of foods and beverages to avoid. Contact your dietitian for more information.   This information is not intended to  replace advice given to you by your health care provider. Make sure you discuss any questions you have with your health care provider.   Document Released: 01/12/2004 Document Revised: 04/16/2014 Document Reviewed: 01/28/2013 Elsevier Interactive Patient Education [employer].

## 2015-08-24 NOTE — Assessment & Plan Note (Signed)
Td, Pneumovax 23 UTD. Follows with Urology for PSA. Prevnar 13 provided today. Declines Zostavax, although high recommended. Colonoscopy in 2016 normal. Discussed the importance of a healthy diet and regular exercise in order for weight loss and to reduce risk of other medical diseases. He is currently following with a therapist through the [place] for PTSD which he finds helpful. He declines further work up for abdominal discomfort. Colonoscopy did not show evidence of diverticulitis. No alarm signs.   I have personally reviewed and have noted: 1. The patient's medical and social history 2. Their use of alcohol, tobacco or illicit drugs 3. Their current medications and supplements 4. The patient's functional ability including ADL's, fall risks, home safety risks and  hearing or visual impairment. 5. Diet and physical activities 6. Evidence for depression or mood disorder

## 2015-08-24 NOTE — Assessment & Plan Note (Signed)
Colonoscopy without evidence of diverticulitis, cancer, polyps in 2016.

## 2015-08-24 NOTE — Assessment & Plan Note (Signed)
Stable  Continue current regimen  

## 2015-08-24 NOTE — Addendum Note (Signed)
Addended by: Jacqualin Combes on: 08/24/2015 02:16 PM   Modules accepted: Miquel Dunn

## 2015-08-24 NOTE — Progress Notes (Signed)
Pre visit review using our clinic review tool, if applicable. No additional management support is needed unless otherwise documented below in the visit note. 

## 2015-09-07 ENCOUNTER — Ambulatory Visit (INDEPENDENT_AMBULATORY_CARE_PROVIDER_SITE_OTHER): Payer: Medicare Other | Admitting: Primary Care

## 2015-09-07 ENCOUNTER — Encounter: Payer: Self-pay | Admitting: Primary Care

## 2015-09-07 VITALS — BP 128/88 | HR 58 | Temp 98.2°F | Ht 70.0 in | Wt 196.4 lb

## 2015-09-07 DIAGNOSIS — S90822A Blister (nonthermal), left foot, initial encounter: Secondary | ICD-10-CM

## 2015-09-07 MED ORDER — CLOBETASOL PROPIONATE 0.05 % EX CREA: 1.0000 "application " | TOPICAL_CREAM | Freq: Two times a day (BID) | CUTANEOUS | Status: DC

## 2015-09-07 NOTE — Patient Instructions (Addendum)
Apply Clobetasol cream twice daily to affected area for 7 days.  Please notify me if the redness increases, no improvement in the blister, or if the blister re-occurs after it's healed.  It was a pleasure to see you today!   Bullous Pemphigoid Bullous pemphigoid is a skin disease that causes blisters to form. It ranges in severity and can last for a long time. The disease can come back months or years after it goes away.  CAUSES The cause of this condition is not known. Certain medicines and conditions, such as diabetes and multiple sclerosis, may be causes. Bullous pemphigoid is an autoimmune disease. This means that the body's own defense system (immune system) attacks the body. RISK FACTORS This condition is more likely to develop in people over the age of 26. SYMPTOMS This condition causes blisters to form on the skin. In mild cases, only a few small blisters form. In severe cases, many large blisters form in several areas of the body. The most common places blisters form are the groin, armpits, trunk, thighs, and forearms. About one third of people with this condition develop blisters in the mouth. The blisters may break open, forming ulcers.  Other symptoms of this condition include:  Redness.  Irritation.  Itching.  Bleeding gums.  Difficulty eating.  Cough.  Pain with swallowing.  Nosebleeds. In most people, the symptoms of bullous pemphigoid go away within 5 years. DIAGNOSIS This condition may be diagnosed with a physical exam and blood tests. A procedure in which a skin sample is taken for testing (skin biopsy) may be done to confirm the diagnosis. TREATMENT This condition may be managed with medicines, such as:  Antibiotic medicines.  Steroid medicines. These may be applied to the skin, taken by mouth, or given as injections.  Medicines that suppress the immune system. If the mouth or lips are affected, a change in diet may also be recommended. People with severe  symptoms may need to be treated at a hospital, where they may receive:  Care for their ulcers.  Medicines given through an IV tube.  Feedings given through an IV tube. This may be done if the mouth or lips are affected. HOME CARE INSTRUCTIONS  Take medicines only as directed by your health care provider.  Keep your skin clean.  Do not scratch, break, or drain your blisters. Doing so can cause them to become infected.  Follow your health care provider's instructions about bandage (dressing) changes and removal.  If your mouth or lips are affected:  Eat a diet made up of soft foods and liquids only.  Avoid drinking very hot liquids. SEEK MEDICAL CARE IF:  Your itching or pain is not helped by medicine.  You develop redness, swelling, or pain that extends beyond your blisters or ulcers.  There is pus coming from a blister or ulcer.  You have a fever.  You have confusion.  You feel unusually tired or weak. SEEK IMMEDIATE MEDICAL CARE IF:  Your pain becomes severe.  You cannot eat or drink because of blisters, ulcers, or pain in your lips or mouth.  You cannot care for yourself because of blisters, ulcers, or pain in your hands or in the soles of your feet.   This information is not intended to replace advice given to you by your health care provider. Make sure you discuss any questions you have with your health care provider.   Document Released: 01/21/2007 Document Revised: 08/10/2014 Document Reviewed: 03/22/2014 Elsevier Interactive Patient Education 2016  [employer].

## 2015-09-07 NOTE — Progress Notes (Signed)
Pre visit review using our clinic review tool, if applicable. No additional management support is needed unless otherwise documented below in the visit note. 

## 2015-09-07 NOTE — Progress Notes (Signed)
Subjective:    Patient ID: Dalton Roy, male    DOB: 02-24-1941, 75 y.o.   MRN: [id number]  HPI  Mr. Pruett is a 75 year old male who presents today with a chief complaint of blister. His blister is located to the left dorsal foot that has been present for the past 2-3 days. This blister has come up twice in the past month. He is wearing the same shoes and does not wear socks. Denies fevers. The blister was itchy initially, but has improved with OTC cortisone cream. He doesn't feel as though his shoes are rubbing his feet.   Review of Systems  Constitutional: Negative for fever and fatigue.  Skin:       Blister        Past Medical History  Diagnosis Date  . Hypertension   . Heart murmur   . Gout   . Migraines   . Hypothyroidism   . Cancer ([place]) 20 years    skin  . Psoriasis   . ITP (idiopathic thrombocytopenic purpura)   . Bipolar 1 disorder [facility])      Social History   Social History  . Marital Status: Married    Spouse Name: N/A  . Number of Children: N/A  . Years of Education: N/A   Occupational History  . Not on file.   Social History Main Topics  . Smoking status: Former Smoker    Types: Cigarettes    Quit date: 04/09/1966  . Smokeless tobacco: Former Systems developer  . Alcohol Use: 0.0 oz/week    0 Standard drinks or equivalent per week  . Drug Use: No  . Sexual Activity: Not on file   Other Topics Concern  . Not on file   Social History Narrative   Married.   [place] veteran.   Moved back to [place] from [place].        Past Surgical History  Procedure Laterality Date  . Melanoma excision  1998  . Hemorrhoid surgery  2001  . Lithotripsy    . Colonoscopy w/ polypectomy  09/12/2007    Tubular adenoma from the proximal transverse colon without atypia.  . Colonoscopy N/A 08/31/2014    Procedure: COLONOSCOPY;  Surgeon: Robert Bellow, MD;  Location: [facility] ENDOSCOPY;  Service: Endoscopy;  Laterality: N/A;    Family History  Problem Relation Age of Onset    . Colon cancer Brother     Half brother  . Cancer Father     bladder cancer    No Known Allergies  Current Outpatient Prescriptions on File Prior to Visit  Medication Sig Dispense Refill  . allopurinol (ZYLOPRIM) 300 MG tablet TAKE 1 TABLET(300 MG) BY MOUTH DAILY 90 tablet 1  . benazepril (LOTENSIN) 40 MG tablet Take 1 tablet (40 mg total) by mouth 2 (two) times daily. 90 tablet 1  . divalproex (DEPAKOTE) 500 MG DR tablet TAKE 1 TABLET(500 MG) BY MOUTH TWICE DAILY 60 tablet 0  . ergotamine-caffeine (CAFERGOT) 1-100 MG tablet Take 1 tablet by mouth. at onset of attack    . levothyroxine (SYNTHROID, LEVOTHROID) 75 MCG tablet Take 0.5 tablets (37.5 mcg total) by mouth daily before breakfast. (Patient taking differently: Take 75 mcg by mouth daily before breakfast. ) 135 tablet 1  . metoprolol succinate (TOPROL-XL) 50 MG 24 hr tablet TAKE 1 TABLET BY MOUTH EVERY DAY WITH OR IMMEDIATELY FOLLOWING A MEAL 90 tablet 1   No current facility-administered medications on file prior to visit.    BP 128/88  mmHg  Pulse 58  Temp(Src) 98.2 F (36.8 C) (Oral)  Ht $R'5\' 10"'xm$  (1.778 m)  Wt 196 lb 6.4 oz (89.086 kg)  BMI 28.18 kg/m2  SpO2 96%    Objective:   Physical Exam  Constitutional: He appears well-nourished.  Skin: Skin is warm and dry.  2 cm area of erythema. 1 cm blister on right side of erythema. Non tender. Intact. No s/s of acute infection.          Assessment & Plan:  Blister:  Present to left dorsal foot. Second occurrence in same spot. Exam today with obvious erythema and blister, somewhat representative of bullous pemphigoid. Non tender. If bullous pemphigoid, then appropriate treatment includes clobetasol 0.05% cream. Rx sent to pharmacy. Discussed proper shoe wear. He is to notify me if no improvement.

## 2015-09-26 ENCOUNTER — Other Ambulatory Visit: Payer: Self-pay | Admitting: Primary Care

## 2015-09-26 NOTE — Telephone Encounter (Signed)
Electronically refill request for   divalproex (DEPAKOTE) 500 MG DR tablet   TAKE 1 TABLET(500 MG) BY MOUTH TWICE DAILY  Dispense: 60 tablet   Refills: 0     Last prescribed on 08/19/2015. Last seen on 09/07/2015. Next lab appt on 10/05/2015.

## 2015-10-05 ENCOUNTER — Other Ambulatory Visit (INDEPENDENT_AMBULATORY_CARE_PROVIDER_SITE_OTHER): Payer: Medicare Other

## 2015-10-05 ENCOUNTER — Other Ambulatory Visit: Payer: Self-pay | Admitting: Primary Care

## 2015-10-05 DIAGNOSIS — E039 Hypothyroidism, unspecified: Secondary | ICD-10-CM

## 2015-10-05 LAB — TSH: TSH: 1.21 u[IU]/mL (ref 0.35–4.50)

## 2015-10-12 ENCOUNTER — Other Ambulatory Visit: Payer: Self-pay

## 2015-10-12 MED ORDER — LEVOTHYROXINE SODIUM 75 MCG PO TABS: 75.0000 ug | ORAL_TABLET | Freq: Every day | ORAL | Status: AC

## 2015-10-12 NOTE — Telephone Encounter (Signed)
Pt left v/m requesting refill levothyroxine 75 mcg taking one daily before breakfast; unable to reach pt and advised pt wife who voiced understanding. (DPR signed).

## 2016-01-11 DIAGNOSIS — Z23 Encounter for immunization: Secondary | ICD-10-CM | POA: Diagnosis not present

## 2016-02-26 ENCOUNTER — Other Ambulatory Visit: Payer: Self-pay | Admitting: Primary Care

## 2016-02-27 NOTE — Telephone Encounter (Signed)
Ok to refill? Electronically refill request for  divalproex (DEPAKOTE) 500 MG DR tablet  Last prescribed on 09/26/2015. Last seen on 09/07/2015.  Patient request 90 days supply

## 2016-03-06 ENCOUNTER — Other Ambulatory Visit: Payer: Self-pay | Admitting: Primary Care

## 2016-03-06 DIAGNOSIS — G43901 Migraine, unspecified, not intractable, with status migrainosus: Secondary | ICD-10-CM

## 2016-03-23 ENCOUNTER — Other Ambulatory Visit: Payer: Self-pay | Admitting: Primary Care

## 2016-03-28 ENCOUNTER — Other Ambulatory Visit: Payer: Self-pay | Admitting: Primary Care

## 2016-05-07 ENCOUNTER — Other Ambulatory Visit: Payer: Self-pay | Admitting: Primary Care

## 2016-06-25 ENCOUNTER — Other Ambulatory Visit: Payer: Self-pay | Admitting: Primary Care

## 2016-08-21 ENCOUNTER — Other Ambulatory Visit: Payer: Self-pay

## 2016-08-21 DIAGNOSIS — G43901 Migraine, unspecified, not intractable, with status migrainosus: Secondary | ICD-10-CM

## 2016-08-21 MED ORDER — ERGOTAMINE-CAFFEINE 1-100 MG PO TABS: ORAL_TABLET | ORAL | 0 refills | Status: DC

## 2016-08-21 NOTE — Telephone Encounter (Signed)
Pt [facility] Last refilled # 100 on 03/07/16; pt last annual wellness 08/24/15; no future appt scheduled. Gentry Fitz NP out of office.Please advise.

## 2016-08-21 NOTE — Telephone Encounter (Signed)
Will send in RX as requested. He needs to make an appt for an annual exam or follow up with PCP

## 2016-08-21 NOTE — Telephone Encounter (Signed)
Message left for patient to return my call.  

## 2016-08-22 ENCOUNTER — Encounter: Payer: Self-pay | Admitting: *Deleted

## 2016-08-22 NOTE — Telephone Encounter (Signed)
Message left for patient to return my call.  Also send a message through [place].

## 2016-09-21 ENCOUNTER — Ambulatory Visit (INDEPENDENT_AMBULATORY_CARE_PROVIDER_SITE_OTHER): Payer: Medicare Other | Admitting: Primary Care

## 2016-09-21 ENCOUNTER — Encounter: Payer: Self-pay | Admitting: Primary Care

## 2016-09-21 VITALS — BP 148/100 | HR 53 | Temp 99.2°F | Ht 70.0 in | Wt 201.4 lb

## 2016-09-21 DIAGNOSIS — J069 Acute upper respiratory infection, unspecified: Secondary | ICD-10-CM | POA: Diagnosis not present

## 2016-09-21 MED ORDER — HYDROCODONE-HOMATROPINE 5-1.5 MG/5ML PO SYRP: 5.0000 mL | ORAL_SOLUTION | Freq: Every evening | ORAL | 0 refills | Status: DC | PRN

## 2016-09-21 NOTE — Progress Notes (Signed)
Subjective:    Patient ID: Dalton Roy, male    DOB: [birth year], 76 y.o.   MRN: [id number]  HPI  Mr. Dalton Roy is a 76 year old male who presents today with a chief complaint of cough. He also reports fevers, nasal congestion, sore throat, palpitations. His cough is productive but he's not looked at his sputum color. His symptoms began about 5-6 days ago. He's been taking Tylenol and Delsym with some improvement. Overall he's slightly better   Review of Systems  Constitutional: Positive for chills, fatigue and fever.  HENT: Positive for congestion and sore throat. Negative for ear pain and sinus pressure.   Respiratory: Positive for cough. Negative for shortness of breath.   Cardiovascular: Negative for chest pain.       Past Medical History:  Diagnosis Date  . Bipolar 1 disorder ([place])   . Cancer ([place]) 20 years   skin  . Gout   . Heart murmur   . Hypertension   . Hypothyroidism   . ITP (idiopathic thrombocytopenic purpura)   . Migraines   . Psoriasis      Social History   Social History  . Marital status: Married    Spouse name: N/A  . Number of children: N/A  . Years of education: N/A   Occupational History  . Not on file.   Social History Main Topics  . Smoking status: Former Smoker    Types: Cigarettes    Quit date: 04/09/1966  . Smokeless tobacco: Former Systems developer  . Alcohol use 0.0 oz/week  . Drug use: No  . Sexual activity: Not on file   Other Topics Concern  . Not on file   Social History Narrative   Married.   [place] veteran.   Moved back to [place] from [place].        Past Surgical History:  Procedure Laterality Date  . COLONOSCOPY N/A 08/31/2014   Procedure: COLONOSCOPY;  Surgeon: Robert Bellow, MD;  Location: [facility] ENDOSCOPY;  Service: Endoscopy;  Laterality: N/A;  . COLONOSCOPY W/ POLYPECTOMY  09/12/2007   Tubular adenoma from the proximal transverse colon without atypia.  [facility] [place]  2001  . LITHOTRIPSY    . MELANOMA EXCISION  1998      Family History  Problem Relation Age of Onset  . Colon cancer Brother        Half brother  . Cancer Father        bladder cancer    No Known Allergies  Current Outpatient Prescriptions on File Prior to Visit  Medication Sig Dispense Refill  . allopurinol (ZYLOPRIM) 300 MG tablet TAKE 1 TABLET(300 MG) BY MOUTH DAILY 90 tablet 1  . benazepril (LOTENSIN) 40 MG tablet TAKE 1 TABLET(40 MG) BY MOUTH TWICE DAILY 60 tablet 5  . clobetasol cream (TEMOVATE) 3.53 % Apply 1 application topically 2 (two) times daily. 30 g 0  . divalproex (DEPAKOTE) 500 MG DR tablet TAKE 1 TABLET(500 MG) BY MOUTH TWICE DAILY 60 tablet 5  . divalproex (DEPAKOTE) 500 MG DR tablet TAKE 1 TABLET(500 MG) BY MOUTH TWICE DAILY 180 tablet 3  . ergotamine-caffeine (CAFERGOT) 1-100 MG tablet Take 2 capsules at onset then 1 cap. every 30 min as needed. total of 6 capsules per attack, nto to exceed 10 capsules per week. 100 tablet 0  . levothyroxine (SYNTHROID, LEVOTHROID) 75 MCG tablet Take 1 tablet (75 mcg total) by mouth daily before breakfast. 90 tablet 3  . metoprolol succinate (TOPROL-XL) 50 MG 24 hr  tablet TAKE 1 TABLET BY MOUTH EVERY DAY WITH OR IMMEDIATELY AFTER A MEAL 90 tablet 1   No current facility-administered medications on file prior to visit.     BP (!) 148/100   Pulse (!) 53   Temp 99.2 F (37.3 C) (Oral)   Ht $R'5\' 10"'En$  (1.778 m)   Wt 201 lb 6.4 oz (91.4 kg)   SpO2 98%   BMI 28.90 kg/m    Objective:   Physical Exam  Constitutional: He appears well-nourished. He does not appear ill.  HENT:  Right Ear: Tympanic membrane and ear canal normal.  Left Ear: Tympanic membrane and ear canal normal.  Nose: Mucosal edema present. Right sinus exhibits no maxillary sinus tenderness and no frontal sinus tenderness. Left sinus exhibits no maxillary sinus tenderness and no frontal sinus tenderness.  Mouth/Throat: Oropharynx is clear and moist.  Eyes: Conjunctivae are normal.  Neck: Neck supple.   Cardiovascular: Normal rate and regular rhythm.   Pulmonary/Chest: Effort normal and breath sounds normal. He has no wheezes. He has no rales.  Skin: Skin is warm and dry.          Assessment & Plan:  URI:  Cough, congestion, fatigue, low grade fevers x 5-6 days. Overall feeling better with OTC treatment, having trouble sleeping. Exam today unremarkable. Does not appear acutely ill, lungs clear, low grade fever. Suspect viral URI and will treat with concervative measures. Rx for Hycodan provided to use HS. Delsym DM for day cough and congestion. Continue tylenol for fevers. He will notify if symptoms do not improve and/or become worse Monday next week.  Sheral Flow, NP

## 2016-09-21 NOTE — Patient Instructions (Signed)
Your symptoms are representative of a viral illness which will resolve on its own over time. Our goal is to treat your symptoms in order to aid your body in the healing process and to make you more comfortable.   You may take the Hycodan cough suppressant at bedtime as needed for cough and rest. Caution this medication contains codeine and will make you feel drowsy.  Continue Delsym DM for daytime cough and congestion.  Continue tylenol as needed for fevers.  Please call me Monday or Tuesday next week if no better or become worse.  It was a pleasure to see you today!   Upper Respiratory Infection, Adult Most upper respiratory infections (URIs) are a viral infection of the air passages leading to the lungs. A URI affects the nose, throat, and upper air passages. The most common type of URI is nasopharyngitis and is typically referred to as "the common cold." URIs run their course and usually go away on their own. Most of the time, a URI does not require medical attention, but sometimes a bacterial infection in the upper airways can follow a viral infection. This is called a secondary infection. Sinus and middle ear infections are common types of secondary upper respiratory infections. Bacterial pneumonia can also complicate a URI. A URI can worsen asthma and chronic obstructive pulmonary disease (COPD). Sometimes, these complications can require emergency medical care and may be life threatening. What are the causes? Almost all URIs are caused by viruses. A virus is a type of germ and can spread from one person to another. What increases the risk? You may be at risk for a URI if:  You smoke.  You have chronic heart or lung disease.  You have a weakened defense (immune) system.  You are very young or very old.  You have nasal allergies or asthma.  You work in crowded or poorly ventilated areas.  You work in health care facilities or schools.  What are the signs or  symptoms? Symptoms typically develop 2-3 days after you come in contact with a cold virus. Most viral URIs last 7-10 days. However, viral URIs from the influenza virus (flu virus) can last 14-18 days and are typically more severe. Symptoms may include:  Runny or stuffy (congested) nose.  Sneezing.  Cough.  Sore throat.  Headache.  Fatigue.  Fever.  Loss of appetite.  Pain in your forehead, behind your eyes, and over your cheekbones (sinus pain).  Muscle aches.  How is this diagnosed? Your health care provider may diagnose a URI by:  Physical exam.  Tests to check that your symptoms are not due to another condition such as: ? Strep throat. ? Sinusitis. ? Pneumonia. ? Asthma.  How is this treated? A URI goes away on its own with time. It cannot be cured with medicines, but medicines may be prescribed or recommended to relieve symptoms. Medicines may help:  Reduce your fever.  Reduce your cough.  Relieve nasal congestion.  Follow these instructions at home:  Take medicines only as directed by your health care provider.  Gargle warm saltwater or take cough drops to comfort your throat as directed by your health care provider.  Use a warm mist humidifier or inhale steam from a shower to increase air moisture. This may make it easier to breathe.  Drink enough fluid to keep your urine clear or pale yellow.  Eat soups and other clear broths and maintain good nutrition.  Rest as needed.  Return to work when  your temperature has returned to normal or as your health care provider advises. You may need to stay home longer to avoid infecting others. You can also use a face mask and careful hand washing to prevent spread of the virus.  Increase the usage of your inhaler if you have asthma.  Do not use any tobacco products, including cigarettes, chewing tobacco, or electronic cigarettes. If you need help quitting, ask your health care provider. How is this  prevented? The best way to protect yourself from getting a cold is to practice good hygiene.  Avoid oral or hand contact with people with cold symptoms.  Wash your hands often if contact occurs.  There is no clear evidence that vitamin C, vitamin E, echinacea, or exercise reduces the chance of developing a cold. However, it is always recommended to get plenty of rest, exercise, and practice good nutrition. Contact a health care provider if:  You are getting worse rather than better.  Your symptoms are not controlled by medicine.  You have chills.  You have worsening shortness of breath.  You have brown or red mucus.  You have yellow or brown nasal discharge.  You have pain in your face, especially when you bend forward.  You have a fever.  You have swollen neck glands.  You have pain while swallowing.  You have white areas in the back of your throat. Get help right away if:  You have severe or persistent: ? Headache. ? Ear pain. ? Sinus pain. ? Chest pain.  You have chronic lung disease and any of the following: ? Wheezing. ? Prolonged cough. ? Coughing up blood. ? A change in your usual mucus.  You have a stiff neck.  You have changes in your: ? Vision. ? Hearing. ? Thinking. ? Mood. This information is not intended to replace advice given to you by your health care provider. Make sure you discuss any questions you have with your health care provider. Document Released: 09/19/2000 Document Revised: 11/27/2015 Document Reviewed: 07/01/2013 Elsevier Interactive Patient Education  2017 [employer].

## 2016-09-24 ENCOUNTER — Telehealth: Payer: Self-pay | Admitting: Primary Care

## 2016-09-24 ENCOUNTER — Other Ambulatory Visit: Payer: Self-pay | Admitting: Primary Care

## 2016-09-24 DIAGNOSIS — J069 Acute upper respiratory infection, unspecified: Secondary | ICD-10-CM

## 2016-09-24 MED ORDER — AZITHROMYCIN 250 MG PO TABS: ORAL_TABLET | ORAL | 0 refills | Status: DC

## 2016-09-24 NOTE — Telephone Encounter (Signed)
Patient Name: Dalton Roy  DOB: 04-12-1940    Initial Comment Husband is coughing up brown with tinges of blood    Nurse Assessment  Nurse: Verlin Fester RN, Stanton Kidney Date/Time (Eastern Time): 09/24/2016 9:37:41 AM  Confirm and document reason for call. If symptomatic, describe symptoms. ---Wife states her husband was seen on Friday and did not know what color the sputum is. Today he is coughing up brown with blood in it and he is unable to take the cough syrup  Does the patient have any new or worsening symptoms? ---Yes  Will a triage be completed? ---Yes  Related visit to physician within the last 2 weeks? ---Yes  Does the PT have any chronic conditions? (i.e. diabetes, asthma, etc.) ---Yes  List chronic conditions. ---"HTN, thyroid, tremors,  Is this a behavioral health or substance abuse call? ---No     Guidelines    Guideline Title Affirmed Question Affirmed Notes  Coughing Up Blood [1] More than one episode of coughing up blood AND [2] < 1 tablespoon (15 ml) of pure red blood    Final Disposition User   See PCP When Office is Open (within 3 days) Verlin Fester, RN, Stanton Kidney    Comments  Office scheduled appt for patient for 08/25/2016 $RemoveBefor'@[id number]$ :45 am  Wife states they were supposed to let Dr. Carlis Abbott know how he was today   Disagree/Comply: Comply

## 2016-09-24 NOTE — Telephone Encounter (Signed)
Message left for patient to return my call.  

## 2016-09-24 NOTE — Telephone Encounter (Signed)
Please notify patient that he does not need to come in again tomorrow. I sent a prescription for Azithromycin antibiotics to his pharmacy.Take 2 tablets by mouth today, then 1 tablet daily for 4 additional days. I'm sorry he couldn't take the Hycodan cough syrup, did he have a reaction? Would he like Tessalon Pearls for cough?

## 2016-09-24 NOTE — Telephone Encounter (Signed)
Pt has appt on 09/25/16 at 11:45 with Allie Bossier NP.

## 2016-09-25 ENCOUNTER — Ambulatory Visit: Payer: Medicare Other | Admitting: Primary Care

## 2016-09-25 MED ORDER — AZITHROMYCIN 250 MG PO TABS: ORAL_TABLET | ORAL | 0 refills | Status: DC

## 2016-09-25 MED ORDER — BENZONATATE 200 MG PO CAPS: 200.0000 mg | ORAL_CAPSULE | Freq: Three times a day (TID) | ORAL | 0 refills | Status: DC | PRN

## 2016-09-25 NOTE — Telephone Encounter (Signed)
Pt and his wife called, please return call  Thanks

## 2016-09-25 NOTE — Telephone Encounter (Signed)
Spoken and notified patient's spouse of Kate's comments. Patient's spouse verbalized understanding.  Resent the azithromycin and benzonatate to [facility]. Appointment for 619/018 had been cancel. They will update [place] if no improvement.

## 2016-11-05 ENCOUNTER — Other Ambulatory Visit: Payer: Self-pay

## 2016-11-13 ENCOUNTER — Telehealth: Payer: Self-pay | Admitting: Primary Care

## 2016-11-13 NOTE — Telephone Encounter (Signed)
Left pt message asking to call Ebony Hail back directly at [phone] to schedule AWV + labs with Katha Cabal and CPE with PCP.  *NOTE* Never had AWV before

## 2016-12-19 NOTE — Telephone Encounter (Signed)
Left pt message asking to call Ebony Hail back directly at [phone] to schedule AWV + labs with Katha Cabal and CPE with PCP.  *NOTE* Never had AWV before

## 2018-11-10 ENCOUNTER — Other Ambulatory Visit: Payer: Self-pay

## 2019-05-21 ENCOUNTER — Other Ambulatory Visit: Payer: Self-pay

## 2019-05-21 ENCOUNTER — Inpatient Hospital Stay (HOSPITAL_COMMUNITY)
Admission: EM | Admit: 2019-05-21 | Discharge: 2019-05-23 | DRG: 204 | Disposition: A | Discharge status: Home / Self Care | Payer: No Typology Code available for payment source | Attending: Internal Medicine | Admitting: Internal Medicine

## 2019-05-21 DIAGNOSIS — R0609 Other forms of dyspnea: Principal | ICD-10-CM | POA: Diagnosis present

## 2019-05-21 DIAGNOSIS — I10 Essential (primary) hypertension: Secondary | ICD-10-CM | POA: Diagnosis present

## 2019-05-21 DIAGNOSIS — F319 Bipolar disorder, unspecified: Secondary | ICD-10-CM | POA: Diagnosis present

## 2019-05-21 DIAGNOSIS — M109 Gout, unspecified: Secondary | ICD-10-CM | POA: Diagnosis present

## 2019-05-21 DIAGNOSIS — R42 Dizziness and giddiness: Secondary | ICD-10-CM | POA: Diagnosis not present

## 2019-05-21 DIAGNOSIS — D693 Immune thrombocytopenic purpura: Secondary | ICD-10-CM | POA: Diagnosis present

## 2019-05-21 DIAGNOSIS — Z86010 Personal history of colonic polyps: Secondary | ICD-10-CM

## 2019-05-21 DIAGNOSIS — Z7982 Long term (current) use of aspirin: Secondary | ICD-10-CM

## 2019-05-21 DIAGNOSIS — Z8673 Personal history of transient ischemic attack (TIA), and cerebral infarction without residual deficits: Secondary | ICD-10-CM

## 2019-05-21 DIAGNOSIS — I129 Hypertensive chronic kidney disease with stage 1 through stage 4 chronic kidney disease, or unspecified chronic kidney disease: Secondary | ICD-10-CM | POA: Diagnosis present

## 2019-05-21 DIAGNOSIS — Z79890 Hormone replacement therapy (postmenopausal): Secondary | ICD-10-CM

## 2019-05-21 DIAGNOSIS — R040 Epistaxis: Secondary | ICD-10-CM | POA: Diagnosis present

## 2019-05-21 DIAGNOSIS — R0600 Dyspnea, unspecified: Secondary | ICD-10-CM | POA: Diagnosis present

## 2019-05-21 DIAGNOSIS — Z79899 Other long term (current) drug therapy: Secondary | ICD-10-CM

## 2019-05-21 DIAGNOSIS — R55 Syncope and collapse: Secondary | ICD-10-CM | POA: Diagnosis present

## 2019-05-21 DIAGNOSIS — Z85828 Personal history of other malignant neoplasm of skin: Secondary | ICD-10-CM

## 2019-05-21 DIAGNOSIS — N183 Chronic kidney disease, stage 3 unspecified: Secondary | ICD-10-CM | POA: Diagnosis present

## 2019-05-21 DIAGNOSIS — E039 Hypothyroidism, unspecified: Secondary | ICD-10-CM | POA: Diagnosis present

## 2019-05-21 DIAGNOSIS — I083 Combined rheumatic disorders of mitral, aortic and tricuspid valves: Secondary | ICD-10-CM | POA: Diagnosis present

## 2019-05-21 DIAGNOSIS — Z20822 Contact with and (suspected) exposure to covid-19: Secondary | ICD-10-CM | POA: Diagnosis present

## 2019-05-21 DIAGNOSIS — D631 Anemia in chronic kidney disease: Secondary | ICD-10-CM | POA: Diagnosis present

## 2019-05-21 DIAGNOSIS — Z87891 Personal history of nicotine dependence: Secondary | ICD-10-CM

## 2019-05-21 DIAGNOSIS — R0602 Shortness of breath: Secondary | ICD-10-CM

## 2019-05-21 LAB — ABO/RH: ABO/RH(D): A POS

## 2019-05-21 LAB — BASIC METABOLIC PANEL
Anion gap: 9 (ref 5–15)
BUN: 18 mg/dL (ref 8–23)
CO2: 23 mmol/L (ref 22–32)
Calcium: 8.5 mg/dL — ABNORMAL LOW (ref 8.9–10.3)
Chloride: 105 mmol/L (ref 98–111)
Creatinine, Ser: 1.66 mg/dL — ABNORMAL HIGH (ref 0.61–1.24)
GFR calc Af Amer: 45 mL/min — ABNORMAL LOW (ref >60)
GFR calc non Af Amer: 39 mL/min — ABNORMAL LOW (ref >60)
Glucose, Bld: 96 mg/dL (ref 70–99)
Potassium: 4.6 mmol/L (ref 3.5–5.1)
Sodium: 137 mmol/L (ref 135–145)

## 2019-05-21 LAB — CBC
HCT: 34.9 % — ABNORMAL LOW (ref 39.0–52.0)
Hemoglobin: 11.7 g/dL — ABNORMAL LOW (ref 13.0–17.0)
MCH: 33 pg (ref 26.0–34.0)
MCHC: 33.5 g/dL (ref 30.0–36.0)
MCV: 98.3 fL (ref 80.0–100.0)
Platelets: 175 10*3/uL (ref 150–400)
RBC: 3.55 MIL/uL — ABNORMAL LOW (ref 4.22–5.81)
RDW: 13.7 % (ref 11.5–15.5)
WBC: 7.6 10*3/uL (ref 4.0–10.5)
nRBC: 0 % (ref 0.0–0.2)

## 2019-05-21 LAB — TSH: TSH: 1.801 u[IU]/mL (ref 0.350–4.500)

## 2019-05-21 LAB — TYPE AND SCREEN
ABO/RH(D): A POS
Antibody Screen: NEGATIVE

## 2019-05-21 LAB — CBG MONITORING, ED: Glucose-Capillary: 79 mg/dL (ref 70–99)

## 2019-05-21 MED ORDER — LACTATED RINGERS IV BOLUS
500.0000 mL | Freq: Once | INTRAVENOUS | Status: AC
  Administered 2019-05-21: 500 mL via INTRAVENOUS

## 2019-05-21 MED ORDER — SODIUM CHLORIDE 0.9% FLUSH: 3.0000 mL | Freq: Once | INTRAVENOUS | Status: DC

## 2019-05-21 NOTE — ED Notes (Signed)
Pt. stated he felt dizzy and had a hard time standing. Respiration increased to 35, HR 101 but Stats remained 99% room air. Pt. Said he did not feel comfortable walking.

## 2019-05-21 NOTE — Discharge Instructions (Addendum)
   Shortness of Breath, Adult Shortness of breath is when a person has trouble breathing enough air or when a person feels like she or he is having trouble breathing in enough air. Shortness of breath could be a sign of a medical problem. Follow these instructions at home:   Pay attention to any changes in your symptoms.  Do not use any products that contain nicotine or tobacco, such as cigarettes, e-cigarettes, and chewing tobacco.  Do not smoke. Smoking is a common cause of shortness of breath. If you need help quitting, ask your health care provider.  Avoid things that can irritate your airways, such as: ? Mold. ? Dust. ? Air pollution. ? Chemical fumes. ? Things that can cause allergy symptoms (allergens), if you have allergies.  Keep your living space clean and free of mold and dust.  Rest as needed. Slowly return to your usual activities.  Take over-the-counter and prescription medicines only as told by your health care provider. This includes oxygen therapy and inhaled medicines.  Keep all follow-up visits as told by your health care provider. This is important. Contact a health care provider if:  Your condition does not improve as soon as expected.  You have a hard time doing your normal activities, even after you rest.  You have new symptoms. Get help right away if:  Your shortness of breath gets worse.  You have shortness of breath when you are resting.  You feel light-headed or you faint.  You have a cough that is not controlled with medicines.  You cough up blood.  You have pain with breathing.  You have pain in your chest, arms, shoulders, or abdomen.  You have a fever.  You cannot walk up stairs or exercise the way that you normally do. These symptoms may represent a serious problem that is an emergency. Do not wait to see if the symptoms will go away. Get medical help right away. Call your local emergency services (911 in the [place] Do not drive  yourself to the hospital. Summary  Shortness of breath is when a person has trouble breathing enough air. It can be a sign of a medical problem.  Avoid things that irritate your lungs, such as smoking, pollution, mold, and dust.  Pay attention to changes in your symptoms and contact your health care provider if you have a hard time completing daily activities because of shortness of breath. This information is not intended to replace advice given to you by your health care provider. Make sure you discuss any questions you have with your health care provider. Document Revised: 08/26/2017 Document Reviewed: 08/26/2017 Elsevier Patient Education  2020 [employer]. Call your Primary Care provider tomorrow to follow up on your lightheadedness.  We recommend urgent follow up of your heart murmur with an Echocardiogram.

## 2019-05-21 NOTE — ED Triage Notes (Signed)
Pt arrived via [facility]. From home. Pt. Had near syncopal episode. Witnessed by family who helped to sit down. Pt. Denies loc and denies pain. Pt complains of SOB during the incident but feels fine now. Pt. States that he has had 4 nose bleeds over the last 4 days and significant blood loss, Pt estimates 1 to 2 pints.

## 2019-05-21 NOTE — ED Provider Notes (Signed)
[facility] EMERGENCY DEPARTMENT Provider Note  CSN: [id number] Arrival date & time: 05/21/19  2034     History Chief Complaint  Patient presents with  . Dizziness  . Shortness of Breath    Dalton Roy is a 79 y.o. male.  The history is provided by the patient and the EMS personnel.  The patient is a 79 year old male with past medical history of gout, hypothyroidism, hypertension, bipolar disorder who presents to the ED for near syncopal episode and shortness of breath.  Patient reports he has been having frequent episodes of epistaxis over the last few days, reports "a pint" of blood loss today, however his epistaxis has now resolved.  Patient reports several hours after his episode of epistaxis he had a near syncopal episode while trying to stand up associated with shortness of breath but no chest pain.  The patient reports a history of a heart murmur but is unable to provide any further details, he denies any history of coronary artery disease.  Symptoms are moderate, symptoms are improved, symptoms are constant.  No palpitations, no recent leg swelling, immobilization, surgeries, no history of DVT/PE.     Past Medical History:  Diagnosis Date  . Bipolar 1 disorder ([place])   . Cancer ([place]) 20 years   skin  . Gout   . Heart murmur   . Hypertension   . Hypothyroidism   . ITP (idiopathic thrombocytopenic purpura)   . Migraines   . Psoriasis     Patient Active Problem List   Diagnosis Date Noted  . Near syncope 05/22/2019  . Medicare annual wellness visit, subsequent 08/24/2015  . Arthritis 05/10/2015  . Essential hypertension 01/27/2015  . Gout 01/27/2015  . Frequent headaches 01/27/2015  . Hypothyroidism 01/27/2015  . History of colonic polyps 08/30/2014    Past Surgical History:  Procedure Laterality Date  . COLONOSCOPY N/A 08/31/2014   Procedure: COLONOSCOPY;  Surgeon: Robert Bellow, MD;  Location: [facility] ENDOSCOPY;  Service: Endoscopy;   Laterality: N/A;  . COLONOSCOPY W/ POLYPECTOMY  09/12/2007   Tubular adenoma from the proximal transverse colon without atypia.  Marland Kitchen [place]  2001  . LITHOTRIPSY    . MELANOMA EXCISION  1998       Family History  Problem Relation Age of Onset  . Colon cancer Brother        Half brother  . Cancer Father        bladder cancer    Social History   Tobacco Use  . Smoking status: Former Smoker    Types: Cigarettes    Quit date: 04/09/1966    Years since quitting: 53.1  . Smokeless tobacco: Former Network engineer Use Topics  . Alcohol use: Yes    Alcohol/week: 0.0 standard drinks  . Drug use: No    Home Medications Prior to Admission medications   Medication Sig Start Date End Date Taking? Authorizing Provider  allopurinol (ZYLOPRIM) 300 MG tablet TAKE 1 TABLET(300 MG) BY MOUTH DAILY 09/24/16   Pleas Koch, NP  azithromycin (ZITHROMAX) 250 MG tablet Take 2 tablets by mouth today, then 1 tablet daily for 4 additional days. 09/25/16   Pleas Koch, NP  benazepril (LOTENSIN) 40 MG tablet TAKE 1 TABLET(40 MG) BY MOUTH TWICE DAILY 06/25/16   Pleas Koch, NP  benzonatate (TESSALON) 200 MG capsule Take 1 capsule (200 mg total) by mouth 3 (three) times daily as needed for cough. 09/25/16   Pleas Koch, NP  clobetasol cream (TEMOVATE) 0.05 % Apply 1 application topically 2 (two) times daily. 09/07/15   Doreene Nest, NP  divalproex (DEPAKOTE) 500 MG DR tablet TAKE 1 TABLET(500 MG) BY MOUTH TWICE DAILY 09/26/15   Doreene Nest, NP  divalproex (DEPAKOTE) 500 MG DR tablet TAKE 1 TABLET(500 MG) BY MOUTH TWICE DAILY 02/27/16   Doreene Nest, NP  ergotamine-caffeine (CAFERGOT) 1-100 MG tablet Take 2 capsules at onset then 1 cap. every 30 min as needed. total of 6 capsules per attack, nto to exceed 10 capsules per week. 08/21/16   Lorre Munroe, NP  levothyroxine (SYNTHROID, LEVOTHROID) 75 MCG tablet Take 1 tablet (75 mcg total) by mouth daily before  breakfast. 10/12/15   Doreene Nest, NP  metoprolol succinate (TOPROL-XL) 50 MG 24 hr tablet TAKE 1 TABLET BY MOUTH EVERY DAY WITH OR IMMEDIATELY AFTER A MEAL 03/28/16   Doreene Nest, NP    Allergies    Patient has no known allergies.  Review of Systems   Review of Systems  Constitutional: Negative for chills and fever.  Respiratory: Positive for shortness of breath. Negative for cough.   Cardiovascular: Negative for chest pain, palpitations and leg swelling.  Gastrointestinal: Negative for abdominal pain, blood in stool, diarrhea, nausea and vomiting.  Neurological: Positive for light-headedness. Negative for syncope.  Hematological: Does not bruise/bleed easily.  All other systems reviewed and are negative.   Physical Exam Updated Vital Signs BP 114/77   Pulse 94   Temp 97.8 F (36.6 C) (Oral)   Resp 20   Ht 5\' 11"  (1.803 m)   Wt 93.9 kg   SpO2 99%   BMI 28.87 kg/m   Physical Exam Vitals and nursing note reviewed.  Constitutional:      Appearance: He is well-developed. He is obese.  HENT:     Head: Normocephalic and atraumatic.  Eyes:     Conjunctiva/sclera: Conjunctivae normal.  Cardiovascular:     Rate and Rhythm: Normal rate and regular rhythm.     Pulses: Normal pulses.     Heart sounds: Murmur present.  Pulmonary:     Effort: Pulmonary effort is normal. No respiratory distress.     Breath sounds: Normal breath sounds.  Abdominal:     Palpations: Abdomen is soft.     Tenderness: There is no abdominal tenderness.  Musculoskeletal:     Cervical back: Neck supple.     Right lower leg: No edema.     Left lower leg: No edema.  Skin:    General: Skin is warm and dry.  Neurological:     Mental Status: He is alert.     Cranial Nerves: No cranial nerve deficit.     Motor: No weakness.  Psychiatric:        Mood and Affect: Mood normal.        Behavior: Behavior normal.     ED Results / Procedures / Treatments   Labs (all labs ordered are listed,  but only abnormal results are displayed) Labs Reviewed  BASIC METABOLIC PANEL - Abnormal; Notable for the following components:      Result Value   Creatinine, Ser 1.66 (*)    Calcium 8.5 (*)    GFR calc non Af Amer 39 (*)    GFR calc Af Amer 45 (*)    All other components within normal limits  CBC - Abnormal; Notable for the following components:   RBC 3.55 (*)    Hemoglobin 11.7 (*)  HCT 34.9 (*)    All other components within normal limits  RESPIRATORY PANEL BY RT PCR (FLU A&B, COVID)  TSH  URINALYSIS, ROUTINE W REFLEX MICROSCOPIC  MAGNESIUM  BRAIN NATRIURETIC PEPTIDE  CBC WITH DIFFERENTIAL/PLATELET  CBG MONITORING, ED  CBG MONITORING, ED  TYPE AND SCREEN  ABO/RH  TROPONIN I (HIGH SENSITIVITY)    EKG EKG Interpretation  Date/Time:  Thursday May 21 2019 23:50:39 EST Ventricular Rate:  80 PR Interval:    QRS Duration: 87 QT Interval:  395 QTC Calculation: 456 R Axis:   -9 Text Interpretation: Sinus rhythm Prolonged PR interval Confirmed by Madalyn Rob [phone]) on 05/21/2019 11:58:27 PM   Radiology No results found.  Procedures Procedures (including critical care time)  Medications Ordered in ED Medications  sodium chloride flush (NS) 0.9 % injection 3 mL (has no administration in time range)  lactated ringers bolus 500 mL (0 mLs Intravenous Stopped 05/21/19 2248)   ED Course  I have reviewed the triage vital signs and the nursing notes.  Pertinent labs & imaging results that were available during my care of the patient were reviewed by me and considered in my medical decision making (see chart for details).    MDM Rules/Calculators/A&P                       Patient is a 79 year old male with past medical history of hypothyroidism, bipolar disorder, gout, hypertension who presents to the ED for near syncopal episode.  Patient reports that he has been having frequent episodes of epistaxis over the last few days, and had a near syncopal episode  several hours after an episode of epistaxis today.  The episode was associated with shortness of breath but no chest pain.    Mild anemia with hemoglobin 11.7, creatinine elevated 1.66 but no recent previous levels available for comparison, TSH normal.  The patient has a harsh systolic murmur in the right upper sternal border concerning for possible aortic stenosis.  Differential includes symptomatic aortic stenosis, orthostatic hypotension, vasovagal syncope, arrhythmia.  Patient initially requested to go home and follow-up outpatient as his labs are reassuring and he was feeling better after some fluids.  Attempted to ambulate the patient and patient became tachypneic and lightheaded.  In the setting of harsh systolic murmur and near syncope and symptomatic orthostasis will admit to medicine for further work-up.   Final Clinical Impression(s) / ED Diagnoses Final diagnoses:  Lightheadedness    Rx / DC Orders ED Discharge Orders    None       Timothey Dahlstrom, [place], MD 05/22/19 6213    Lucrezia Starch, MD 05/22/19 1615

## 2019-05-21 NOTE — ED Notes (Addendum)
CBG Results of 79 reported to Wille Glaser, Therapist, sports.

## 2019-05-22 ENCOUNTER — Observation Stay (HOSPITAL_COMMUNITY): Payer: No Typology Code available for payment source

## 2019-05-22 ENCOUNTER — Observation Stay (HOSPITAL_BASED_OUTPATIENT_CLINIC_OR_DEPARTMENT_OTHER): Payer: No Typology Code available for payment source

## 2019-05-22 ENCOUNTER — Encounter (HOSPITAL_COMMUNITY): Payer: Self-pay | Admitting: Internal Medicine

## 2019-05-22 DIAGNOSIS — R040 Epistaxis: Secondary | ICD-10-CM

## 2019-05-22 DIAGNOSIS — I340 Nonrheumatic mitral (valve) insufficiency: Secondary | ICD-10-CM

## 2019-05-22 DIAGNOSIS — R55 Syncope and collapse: Secondary | ICD-10-CM | POA: Diagnosis not present

## 2019-05-22 DIAGNOSIS — I350 Nonrheumatic aortic (valve) stenosis: Secondary | ICD-10-CM | POA: Diagnosis not present

## 2019-05-22 DIAGNOSIS — I10 Essential (primary) hypertension: Secondary | ICD-10-CM | POA: Diagnosis not present

## 2019-05-22 DIAGNOSIS — I351 Nonrheumatic aortic (valve) insufficiency: Secondary | ICD-10-CM

## 2019-05-22 DIAGNOSIS — R0600 Dyspnea, unspecified: Secondary | ICD-10-CM | POA: Diagnosis not present

## 2019-05-22 DIAGNOSIS — R0989 Other specified symptoms and signs involving the circulatory and respiratory systems: Secondary | ICD-10-CM

## 2019-05-22 LAB — CBC WITH DIFFERENTIAL/PLATELET
Abs Immature Granulocytes: 0.02 10*3/uL (ref 0.00–0.07)
Abs Immature Granulocytes: 0.02 10*3/uL (ref 0.00–0.07)
Basophils Absolute: 0 10*3/uL (ref 0.0–0.1)
Basophils Absolute: 0 10*3/uL (ref 0.0–0.1)
Basophils Relative: 0 %
Basophils Relative: 0 %
Eosinophils Absolute: 0 10*3/uL (ref 0.0–0.5)
Eosinophils Absolute: 0.1 10*3/uL (ref 0.0–0.5)
Eosinophils Relative: 0 %
Eosinophils Relative: 1 %
HCT: 34.9 % — ABNORMAL LOW (ref 39.0–52.0)
HCT: 30.4 % — ABNORMAL LOW (ref 39.0–52.0)
Hemoglobin: 11.6 g/dL — ABNORMAL LOW (ref 13.0–17.0)
Hemoglobin: 10.1 g/dL — ABNORMAL LOW (ref 13.0–17.0)
Immature Granulocytes: 0 %
Immature Granulocytes: 0 %
Lymphocytes Relative: 19 %
Lymphocytes Relative: 22 %
Lymphs Abs: 1.4 10*3/uL (ref 0.7–4.0)
Lymphs Abs: 1.4 10*3/uL (ref 0.7–4.0)
MCH: 32.8 pg (ref 26.0–34.0)
MCH: 32.5 pg (ref 26.0–34.0)
MCHC: 33.2 g/dL (ref 30.0–36.0)
MCHC: 33.2 g/dL (ref 30.0–36.0)
MCV: 98.6 fL (ref 80.0–100.0)
MCV: 97.7 fL (ref 80.0–100.0)
Monocytes Absolute: 0.4 10*3/uL (ref 0.1–1.0)
Monocytes Absolute: 0.5 10*3/uL (ref 0.1–1.0)
Monocytes Relative: 5 %
Monocytes Relative: 9 %
Neutro Abs: 5.4 10*3/uL (ref 1.7–7.7)
Neutro Abs: 4.2 10*3/uL (ref 1.7–7.7)
Neutrophils Relative %: 76 %
Neutrophils Relative %: 68 %
Platelets: 190 10*3/uL (ref 150–400)
Platelets: 179 10*3/uL (ref 150–400)
RBC: 3.54 MIL/uL — ABNORMAL LOW (ref 4.22–5.81)
RBC: 3.11 MIL/uL — ABNORMAL LOW (ref 4.22–5.81)
RDW: 13.7 % (ref 11.5–15.5)
RDW: 13.6 % (ref 11.5–15.5)
WBC: 7.3 10*3/uL (ref 4.0–10.5)
WBC: 6.1 10*3/uL (ref 4.0–10.5)
nRBC: 0 % (ref 0.0–0.2)
nRBC: 0 % (ref 0.0–0.2)

## 2019-05-22 LAB — COMPREHENSIVE METABOLIC PANEL
ALT: 31 U/L (ref 0–44)
AST: 31 U/L (ref 15–41)
Albumin: 3.2 g/dL — ABNORMAL LOW (ref 3.5–5.0)
Alkaline Phosphatase: 65 U/L (ref 38–126)
Anion gap: 8 (ref 5–15)
BUN: 28 mg/dL — ABNORMAL HIGH (ref 8–23)
CO2: 24 mmol/L (ref 22–32)
Calcium: 8.6 mg/dL — ABNORMAL LOW (ref 8.9–10.3)
Chloride: 106 mmol/L (ref 98–111)
Creatinine, Ser: 1.88 mg/dL — ABNORMAL HIGH (ref 0.61–1.24)
GFR calc Af Amer: 39 mL/min — ABNORMAL LOW (ref >60)
GFR calc non Af Amer: 34 mL/min — ABNORMAL LOW (ref >60)
Glucose, Bld: 113 mg/dL — ABNORMAL HIGH (ref 70–99)
Potassium: 5.7 mmol/L — ABNORMAL HIGH (ref 3.5–5.1)
Sodium: 138 mmol/L (ref 135–145)
Total Bilirubin: 0.8 mg/dL (ref 0.3–1.2)
Total Protein: 5.8 g/dL — ABNORMAL LOW (ref 6.5–8.1)

## 2019-05-22 LAB — GLUCOSE, CAPILLARY
Glucose-Capillary: 91 mg/dL (ref 70–99)
Glucose-Capillary: 74 mg/dL (ref 70–99)
Glucose-Capillary: 80 mg/dL (ref 70–99)
Glucose-Capillary: 132 mg/dL — ABNORMAL HIGH (ref 70–99)

## 2019-05-22 LAB — URINALYSIS, ROUTINE W REFLEX MICROSCOPIC
Bilirubin Urine: NEGATIVE
Glucose, UA: NEGATIVE mg/dL
Hgb urine dipstick: NEGATIVE
Ketones, ur: NEGATIVE mg/dL
Leukocytes,Ua: NEGATIVE
Nitrite: NEGATIVE
Protein, ur: NEGATIVE mg/dL
Specific Gravity, Urine: 1.015 (ref 1.005–1.030)
pH: 5 (ref 5.0–8.0)

## 2019-05-22 LAB — ECHOCARDIOGRAM COMPLETE
Height: 67 in
Weight: 3331.2 oz

## 2019-05-22 LAB — RESPIRATORY PANEL BY RT PCR (FLU A&B, COVID)
Influenza A by PCR: NEGATIVE
Influenza B by PCR: NEGATIVE
SARS Coronavirus 2 by RT PCR: NEGATIVE

## 2019-05-22 LAB — D-DIMER, QUANTITATIVE: D-Dimer, Quant: <0.27 ug/mL-FEU (ref 0.00–0.50)

## 2019-05-22 LAB — BRAIN NATRIURETIC PEPTIDE: B Natriuretic Peptide: 60.1 pg/mL (ref 0.0–100.0)

## 2019-05-22 LAB — TROPONIN I (HIGH SENSITIVITY)
Troponin I (High Sensitivity): 66 ng/L — ABNORMAL HIGH (ref <18)
Troponin I (High Sensitivity): 59 ng/L — ABNORMAL HIGH (ref <18)

## 2019-05-22 MED ORDER — VERAPAMIL HCL ER 120 MG PO TBCR
120.0000 mg | EXTENDED_RELEASE_TABLET | Freq: Every day | ORAL | Status: DC
  Administered 2019-05-22: 120 mg via ORAL
  Filled 2019-05-22 (×2): qty 1

## 2019-05-22 MED ORDER — ALLOPURINOL 300 MG PO TABS
300.0000 mg | ORAL_TABLET | Freq: Every day | ORAL | Status: DC
  Administered 2019-05-22: 300 mg via ORAL
  Filled 2019-05-22 (×2): qty 1

## 2019-05-22 MED ORDER — DULOXETINE HCL 60 MG PO CPEP
60.0000 mg | ORAL_CAPSULE | Freq: Two times a day (BID) | ORAL | Status: DC
  Administered 2019-05-22 – 2019-05-23 (×3): 60 mg via ORAL
  Filled 2019-05-22 (×4): qty 1

## 2019-05-22 MED ORDER — ATORVASTATIN CALCIUM 10 MG PO TABS
20.0000 mg | ORAL_TABLET | Freq: Every day | ORAL | Status: DC
  Administered 2019-05-22: 20 mg via ORAL
  Filled 2019-05-22: qty 2

## 2019-05-22 MED ORDER — OXYMETAZOLINE HCL 0.05 % NA SOLN
1.0000 | Freq: Once | NASAL | Status: AC
  Administered 2019-05-22: 1 via NASAL
  Filled 2019-05-22: qty 30

## 2019-05-22 MED ORDER — ACETAMINOPHEN 650 MG RE SUPP: 650.0000 mg | Freq: Four times a day (QID) | RECTAL | Status: DC | PRN

## 2019-05-22 MED ORDER — ACETAMINOPHEN 325 MG PO TABS
650.0000 mg | ORAL_TABLET | Freq: Four times a day (QID) | ORAL | Status: DC | PRN
  Administered 2019-05-22 (×2): 650 mg via ORAL
  Filled 2019-05-22 (×2): qty 2

## 2019-05-22 MED ORDER — GABAPENTIN 400 MG PO CAPS
400.0000 mg | ORAL_CAPSULE | Freq: Two times a day (BID) | ORAL | Status: DC
  Administered 2019-05-22 – 2019-05-23 (×3): 400 mg via ORAL
  Filled 2019-05-22 (×3): qty 1

## 2019-05-22 MED ORDER — LEVOTHYROXINE SODIUM 75 MCG PO TABS
75.0000 ug | ORAL_TABLET | Freq: Every day | ORAL | Status: DC
  Administered 2019-05-22 – 2019-05-23 (×2): 75 ug via ORAL
  Filled 2019-05-22 (×2): qty 1

## 2019-05-22 MED ORDER — PANTOPRAZOLE SODIUM 40 MG PO TBEC
40.0000 mg | DELAYED_RELEASE_TABLET | Freq: Every day | ORAL | Status: DC
  Administered 2019-05-22 – 2019-05-23 (×2): 40 mg via ORAL
  Filled 2019-05-22 (×2): qty 1

## 2019-05-22 NOTE — Progress Notes (Signed)
  Echocardiogram 2D Echocardiogram has been performed.  Dalton Roy 05/22/2019, 10:16 AM

## 2019-05-22 NOTE — Progress Notes (Signed)
Rounded on patient applied SCD hose and stepped out for vascular US.

## 2019-05-22 NOTE — H&P (Signed)
History and Physical    Dalton Roy FXT:[id number] DOB: Mar 29, 1941 DOA: 05/21/2019  PCP: [facility] [place]  Patient coming from: Home.  Chief Complaint: Shortness of breath and epistaxis.  HPI: Dalton Roy is a 79 y.o. male with history of hypertension, stroke, gout, hypothyroidism, ITP, depression presents to the ER because of sudden onset of shortness of breath.  Patient states over the last 2 weeks he has been having epistaxis off and on which became more severe in the last 2 days.  He may have lost at least a pint of blood as per the patient.  Yesterday he became more short of breath with minimal exertion with feeling of that he is going to pass out when he tries to exert.  Has not had any chest pain productive cough fever chills.  No new medications.  Has not had any nausea vomiting or diarrhea.  Symptoms only on exertion.  ED Course: In the ER patient was afebrile not hypoxic EKG shows normal sinus rhythm and had some epistaxis on the right naris which was self-limited.  Covid test was negative.  Chest x-ray is pending.  On exam patient does have a systolic heart murmur.  Labs reveal creatinine 1.6 increased from baseline of 1.3 hemoglobin 1.7 platelets are normal high sensitive troponin was 66 and 59.  BNP 60.1.  Patient admitted for further management of near syncope episode with exertional shortness of breath.  Review of Systems: As per HPI, rest all negative.   Past Medical History:  Diagnosis Date  . Bipolar 1 disorder ([place])   . Cancer ([place]) 20 years   skin  . Gout   . Heart murmur   . Hypertension   . Hypothyroidism   . ITP (idiopathic thrombocytopenic purpura)   . Migraines   . Psoriasis     Past Surgical History:  Procedure Laterality Date  . COLONOSCOPY N/A 08/31/2014   Procedure: COLONOSCOPY;  Surgeon: Robert Bellow, MD;  Location: [facility] ENDOSCOPY;  Service: Endoscopy;  Laterality: N/A;  . COLONOSCOPY W/ POLYPECTOMY  09/12/2007   Tubular adenoma  from the proximal transverse colon without atypia.  Marland Kitchen [place]  2001  . LITHOTRIPSY    . MELANOMA EXCISION  1998     reports that he quit smoking about 53 years ago. His smoking use included cigarettes. He has quit using smokeless tobacco. He reports current alcohol use. He reports that he does not use drugs.  No Known Allergies  Family History  Problem Relation Age of Onset  . Colon cancer Brother        Half brother  . Cancer Father        bladder cancer    Prior to Admission medications   Medication Sig Start Date End Date Taking? Authorizing Provider  allopurinol (ZYLOPRIM) 300 MG tablet TAKE 1 TABLET(300 MG) BY MOUTH DAILY Patient taking differently: Take 300 mg by mouth daily. TAKE 1 TABLET(300 MG) BY MOUTH DAILY 09/24/16  Yes Pleas Koch, NP  ammonium lactate (LAC-HYDRIN) 12 % lotion Apply 1 application topically as needed for dry skin.   Yes [provider]  aspirin 325 MG EC tablet Take 325 mg by mouth daily.   Yes [provider]  atorvastatin (LIPITOR) 40 MG tablet Take 20 mg by mouth at bedtime.   Yes [provider]  DULoxetine (CYMBALTA) 60 MG capsule Take 60 mg by mouth 2 (two) times daily.   Yes [provider]  fluocinonide ointment (LIDEX) 0.05 %  Apply 1 application topically daily as needed (dry skin).   Yes [provider]  gabapentin (NEURONTIN) 400 MG capsule Take 400 mg by mouth 2 (two) times daily.   Yes [provider]  hydrophilic ointment Apply 1 application topically as needed for dry skin.   Yes [provider]  levothyroxine (SYNTHROID, LEVOTHROID) 75 MCG tablet Take 1 tablet (75 mcg total) by mouth daily before breakfast. 10/12/15  Yes Pleas Koch, NP  omeprazole (PRILOSEC) 20 MG capsule Take 20 mg by mouth daily.   Yes [provider]  Salicylic Acid, Acne, (SALICYLIC ACID EX) Apply 1 application topically at bedtime.   Yes [provider]  sildenafil  (VIAGRA) 50 MG tablet Take 25 mg by mouth daily as needed for erectile dysfunction.   Yes [provider]  SUMAtriptan (IMITREX) 100 MG tablet Take 50 mg by mouth every 2 (two) hours as needed for migraine (Pt takes 1 every night at bedtime). May repeat in 2 hours if headache persists or recurs.    Yes [provider]  verapamil (VERELAN PM) 120 MG 24 hr capsule Take 120 mg by mouth at bedtime.   Yes [provider]    Physical Exam: Constitutional: Moderately built and nourished. Vitals:   05/21/19 2330 05/21/19 2345 05/22/19 0000 05/22/19 0215  BP: 134/73 (!) 144/84 114/77 (!) 143/95  Pulse: 80 81 94 96  Resp: $Remo'20 14 20 19  'xwwjo$ Temp:      TempSrc:      SpO2: 99% 98% 99% 100%  Weight:      Height:       Eyes: Anicteric no pallor. ENMT: At the time of my exam no active epistaxis. Neck: No JVD appreciated no mass felt. Respiratory: No rhonchi or crepitations. Cardiovascular: S1-S2 heard. Abdomen: Soft nontender bowel sounds present. Musculoskeletal: No edema. Skin: No rash. Neurologic: Alert awake oriented to time place and person.  Moves all extremities. Psychiatric: Appears normal with normal affect.   Labs on Admission: I have personally reviewed following labs and imaging studies  CBC: Recent Labs  Lab 05/21/19 2059 05/22/19 0002  WBC 7.6 7.3  NEUTROABS  --  5.4  HGB 11.7* 11.6*  HCT 34.9* 34.9*  MCV 98.3 98.6  PLT 175 798   Basic Metabolic Panel: Recent Labs  Lab 05/21/19 2059  NA 137  K 4.6  CL 105  CO2 23  GLUCOSE 96  BUN 18  CREATININE 1.66*  CALCIUM 8.5*   GFR: Estimated Creatinine Clearance: 43.6 mL/min (A) (by C-G formula based on SCr of 1.66 mg/dL (H)). Liver Function Tests: No results for input(s): AST, ALT, ALKPHOS, BILITOT, PROT, ALBUMIN in the last 168 hours. No results for input(s): LIPASE, AMYLASE in the last 168 hours. No results for input(s): AMMONIA in the last 168 hours. Coagulation Profile: No results for  input(s): INR, PROTIME in the last 168 hours. Cardiac Enzymes: No results for input(s): CKTOTAL, CKMB, CKMBINDEX, TROPONINI in the last 168 hours. BNP (last 3 results) No results for input(s): PROBNP in the last 8760 hours. HbA1C: No results for input(s): HGBA1C in the last 72 hours. CBG: Recent Labs  Lab 05/21/19 2034  GLUCAP 79   Lipid Profile: No results for input(s): CHOL, HDL, LDLCALC, TRIG, CHOLHDL, LDLDIRECT in the last 72 hours. Thyroid Function Tests: Recent Labs    05/21/19 2120  TSH 1.801   Anemia Panel: No results for input(s): VITAMINB12, FOLATE, FERRITIN, TIBC, IRON, RETICCTPCT in the last 72 hours. Urine analysis:  Component Value Date/Time   COLORURINE YELLOW 05/22/2019 0040   APPEARANCEUR HAZY (A) 05/22/2019 0040   LABSPEC 1.015 05/22/2019 0040   PHURINE 5.0 05/22/2019 0040   GLUCOSEU NEGATIVE 05/22/2019 0040   HGBUR NEGATIVE 05/22/2019 0040   BILIRUBINUR NEGATIVE 05/22/2019 0040   KETONESUR NEGATIVE 05/22/2019 0040   PROTEINUR NEGATIVE 05/22/2019 0040   NITRITE NEGATIVE 05/22/2019 0040   LEUKOCYTESUR NEGATIVE 05/22/2019 0040   Sepsis Labs: $RemoveBefo'@[email or web address]'[id number]$ (procalcitonin:4,lacticidven:4) ) Recent Results (from the past 240 hour(s))  Respiratory Panel by RT PCR (Flu A&B, Covid) - Nasopharyngeal Swab     Status: None   Collection Time: 05/22/19 12:36 AM   Specimen: Nasopharyngeal Swab  Result Value Ref Range Status   SARS Coronavirus 2 by RT PCR NEGATIVE NEGATIVE Final    Comment: (NOTE) SARS-CoV-2 target nucleic acids are NOT DETECTED. The SARS-CoV-2 RNA is generally detectable in upper respiratoy specimens during the acute phase of infection. The lowest concentration of SARS-CoV-2 viral copies this assay can detect is 131 copies/mL. A negative result does not preclude SARS-Cov-2 infection and should not be used as the sole basis for treatment or other patient management decisions. A negative result may occur with  improper specimen  collection/handling, submission of specimen other than nasopharyngeal swab, presence of viral mutation(s) within the areas targeted by this assay, and inadequate number of viral copies (<131 copies/mL). A negative result must be combined with clinical observations, patient history, and epidemiological information. The expected result is Negative. Fact Sheet for Patients:  PinkCheek.be Fact Sheet for Healthcare Providers:  GravelBags.it This test is not yet ap proved or cleared by the [place] FDA and  has been authorized for detection and/or diagnosis of SARS-CoV-2 by FDA under an Emergency Use Authorization (EUA). This EUA will remain  in effect (meaning this test can be used) for the duration of the COVID-19 declaration under Section 564(b)(1) of the Act, 21 U.S.C. section 360bbb-3(b)(1), unless the authorization is terminated or revoked sooner.    Influenza A by PCR NEGATIVE NEGATIVE Final   Influenza B by PCR NEGATIVE NEGATIVE Final    Comment: (NOTE) The Xpert Xpress SARS-CoV-2/FLU/RSV assay is intended as an aid in  the diagnosis of influenza from Nasopharyngeal swab specimens and  should not be used as a sole basis for treatment. Nasal washings and  aspirates are unacceptable for Xpert Xpress SARS-CoV-2/FLU/RSV  testing. Fact Sheet for Patients: PinkCheek.be Fact Sheet for Healthcare Providers: GravelBags.it This test is not yet approved or cleared by the [place] FDA and  has been authorized for detection and/or diagnosis of SARS-CoV-2 by  FDA under an Emergency Use Authorization (EUA). This EUA will remain  in effect (meaning this test can be used) for the duration of the  Covid-19 declaration under Section 564(b)(1) of the Act, 21  U.S.C. section 360bbb-3(b)(1), unless the authorization is  terminated or revoked. Performed at [facility] [place] [address] [place], [place] [address]      Radiological Exams on Admission: No results found.  EKG: Independently reviewed.  Normal sinus rhythm.  Assessment/Plan Principal Problem:   Near syncope Active Problems:   Essential hypertension   Hypothyroidism    1. Exertional shortness of breath with near syncopal episodes -a 2D echo done and 2015 shows EF of 55 to 60% with concentric left ventricular hypertrophy with no features of obstruction.  Given the murmur and the symptoms will check 2D echo.  Get cardiology evaluation.  Chest x-ray is pending.  Check D-dimer. 2. Epistaxis presently  self-limited and closely follow CBC and will hold aspirin.  Patient is agreeable to holding aspirin. 3. History of stroke presently holding aspirin after discussing with patient due to epistaxis.  Continue statins. 4. Hypothyroidism Synthroid. 5. Hypertension on verapamil. 6. History of gout on allopurinol. 7. Chronic and disease stage III with mildly elevated creatinine from baseline.  Follow metabolic panel closely. 8. History of depression on Cymbalta.   DVT prophylaxis: SCDs.  Avoiding anticoagulation due to epistaxis. Code Status: Full code. Family Communication: Discussed with patient. Disposition Plan: Home. Consults called: Cardiology. Admission status: Observation.   Rise Patience MD [facility] Pager [phone].  If 7PM-7AM, please contact night-coverage [email or web address] Password [facility]  05/22/2019, 3:17 AM

## 2019-05-22 NOTE — Progress Notes (Signed)
Patient placed in observation after midnight but care began prior to midnight.  This of breath and a nosebleed.  Echo pending as patient has a murmur.  Cardiology consultation pending as well.  Chest x-ray unrevealing as well as D-dimer which is negative.  Her nosebleed continue Afrin and monitor.  If not improved by this afternoon may need nasal/[place] and ENT consultation. Eulogio Bear

## 2019-05-22 NOTE — ED Notes (Addendum)
Pt's nose still bleeding, administered additional dose of Afrin. MD Hal Hope paged/notified. Suction at bedside for pt use to clear blood from mouth.

## 2019-05-22 NOTE — Plan of Care (Signed)

## 2019-05-22 NOTE — TOC Initial Note (Signed)
Transition of Care [facility]) - Initial/Assessment Note    Patient Details  Name: Dalton Roy MRN: [id number] Date of Birth: June 01, 1940  Transition of Care [facility]) CM/SW Contact:    Sherrilyn Rist Advanced Care Supervisor Phone Number: [phone] 05/22/2019, 10:54 AM  Clinical Narrative:                 CM talked to patient at the bedside; Lives at home with spouse; patient goes to the [facility]; PCP is Dr Spero Curb; receives his medication through the [facility] pharmacy mail order; DME - cane; independent of ADL; [facility] [phone]; opt 1; fax # [phone]; please write on the script that pt needs his medicine ASAP; medication will be expressed mailed to the patient within 24-48 hrs per [facility] with the [facility] pharmacy.)  Expected Discharge Plan: Home/Self Care Barriers to Discharge: No Barriers Identified   Patient Goals and CMS Choice Patient states their goals for this hospitalization and ongoing recovery are:: to get out of here back home   Choice offered to / list presented to : NA  Expected Discharge Plan and Services Expected Discharge Plan: Home/Self Care   Discharge Planning Services: CM Consult   Living arrangements for the past 2 months: Single Family Home                                      Prior Living Arrangements/Services Living arrangements for the past 2 months: Single Family Home Lives with:: Spouse Patient language and need for interpreter reviewed:: No Do you feel safe going back to the place where you live?: Yes      Need for Family Participation in Patient Care: No (Comment) Care giver support system in place?: Yes (comment) Current home services: DME Criminal Activity/Legal Involvement Pertinent to Current Situation/Hospitalization: No - Comment as needed  Activities of Daily Living Home Assistive Devices/Equipment: Grab bars in shower, Shower chair with back ADL Screening (condition at time of admission) Patient's cognitive ability  adequate to safely complete daily activities?: Yes Is the patient deaf or have difficulty hearing?: No Does the patient have difficulty seeing, even when wearing glasses/contacts?: No Does the patient have difficulty concentrating, remembering, or making decisions?: No Patient able to express need for assistance with ADLs?: Yes Does the patient have difficulty dressing or bathing?: Yes Independently performs ADLs?: Yes (appropriate for developmental age) Does the patient have difficulty walking or climbing stairs?: Yes Weakness of Legs: Both Weakness of Arms/Hands: None  Permission Sought/Granted Permission sought to share information with : Case Manager Permission granted to share information with : Yes, Verbal Permission Granted     Permission granted to share info w AGENCY: Educational psychologist granted to share info w Relationship: spouse - Paediatric nurse     Emotional Assessment Appearance:: Appears older than stated age Attitude/Demeanor/Rapport: Apprehensive Affect (typically observed): Calm Orientation: : Oriented to Self, Oriented to Place, Oriented to  Time, Oriented to Situation, Fluctuating Orientation (Suspected and/or reported Sundowners) Alcohol / Substance Use: Not Applicable Psych Involvement: No (comment)  Admission diagnosis:  Lightheadedness [R42] Near syncope [R55] Patient Active Problem List   Diagnosis Date Noted  . Near syncope 05/22/2019  . Medicare annual wellness visit, subsequent 08/24/2015  . Arthritis 05/10/2015  . Essential hypertension 01/27/2015  . Gout 01/27/2015  . Frequent headaches 01/27/2015  . Hypothyroidism 01/27/2015  . History of colonic polyps 08/30/2014   PCP:  [facility] [place]  [facility]:   [place], [place] - [address] [place] [address] Phone: [phone] Fax: [facility] [id number] [place], [place] [address] Phone: [phone] Fax: [phone]     Social Determinants of Health (SDOH) Interventions    Readmission Risk Interventions No flowsheet data found.

## 2019-05-22 NOTE — ED Notes (Signed)
MD Hal Hope returned call. MD Wickline came to check on pt, was advised that bleed under control. MD Wickline advised this RN that if bleed reoccurs, administer lidocaine/epi via gauze in nose and to call him. Will continue to monitor pt & notify MD Wickline of need for further intervention.

## 2019-05-22 NOTE — Progress Notes (Signed)
Carotid artery duplex completed. Refer to "CV Proc" under chart review to view preliminary results.  05/22/2019 4:39 PM Kelby Aline., MHA, RVT, RDCS, RDMS

## 2019-05-22 NOTE — ED Notes (Signed)
RN Joe stated he performed both orthastatics and ambulated pt

## 2019-05-22 NOTE — ED Notes (Signed)
Pt arrived to rm9 with nose bleeding after COVID swab. This RN utilized standing epistaxis order & obtained Afrin spray. This RN then paged MD Hal Hope to obtain permission to administer Afrin spray. MD Hal Hope advised this RN to administer spray per order. Spray administered, pt holding pressure at bridge of nose, suction at bedside. Will continue to monitor pt

## 2019-05-22 NOTE — Progress Notes (Addendum)
Paged Dr Hal Hope due to pt's ongoing nose bleed. Advised to keep afrin soaked gauze in nose for 1-2 hrs as long as not interfering with breathing. Pt not having respiratory distress at this time. Sitting up on side of bed with suction at side.  Half-3/4 palm sized clot noted when gauze was changed.

## 2019-05-22 NOTE — ED Notes (Signed)
Attempted lab collection, pt requesting labs be pulled from his IV, RN made aware.

## 2019-05-22 NOTE — Consult Note (Addendum)
Cardiology Consultation:   Patient ID: TORREN MAFFEO MRN: [id number]; DOB: Jun 21, 1940  Admit date: 05/21/2019 Date of Consult: 05/22/2019  Primary Care Provider: [facility] Primary Cardiologist: Dr. Allyson Sabal   Patient Profile:   Dalton Roy is a 79 y.o. male with a hx of HTN, CKD III, mild MR, hypothyroidism, gout, ITP, stroke and depression who is being seen today for the evaluation of DOE  at the request of Dr. Toniann Fail.   No prior cardiac hx. Mild MR by echo in 2015.  History of Present Illness:   Dalton Roy presented for evaluation of 1 to 2 weeks history of right nosebleed.  Bleeding occurred suddenly multiple times per day and last for 30 to 45 minutes.  Yesterday he had a recurrent bleed at home while sitting on a recliner.  He stood up and trying to go bathroom and suddenly felt dizzy and severe shortness of breath.  No loss of consciousness.  Denies associated chest pain or palpitation.  Symptoms resolved within 5 minutes.  Due to recurrent nosebleeds he came to ER for further admission.  He was given Afrin spray for epistasis.  BNP 60. Hs-troponin 66>>59>>81 K 4.6>>5.7 Scr 1.66>>1.88 Hgb 11.7>>11.6>>10.1 Respiratory panel negative  CXR without active disease Normal platelets  At baseline he walks 30 to 45 minutes, most of the days without chest pain or shortness of breath.  No family history of CAD.  No history of orthopnea, PND, syncope, lower extremity edema or melena.  Heart Pathway Score:     Past Medical History:  Diagnosis Date   Bipolar 1 disorder (HCC)    Cancer (HCC) 20 years   skin   Gout    Heart murmur    Hypertension    Hypothyroidism    ITP (idiopathic thrombocytopenic purpura)    Migraines    Psoriasis     Past Surgical History:  Procedure Laterality Date   COLONOSCOPY N/A 08/31/2014   Procedure: COLONOSCOPY;  Surgeon: Earline Mayotte, MD;  Location: [facility] ENDOSCOPY;  Service: Endoscopy;  Laterality: N/A;   COLONOSCOPY W/  POLYPECTOMY  09/12/2007   Tubular adenoma from the proximal transverse colon without atypia.   HEMORRHOID SURGERY  2001   LITHOTRIPSY     MELANOMA EXCISION  1998     Inpatient Medications: Scheduled Meds:  allopurinol  300 mg Oral Daily   atorvastatin  20 mg Oral QHS   DULoxetine  60 mg Oral BID   gabapentin  400 mg Oral BID   levothyroxine  75 mcg Oral QAC breakfast   pantoprazole  40 mg Oral Daily   sodium chloride flush  3 mL Intravenous Once   verapamil  120 mg Oral QHS   Continuous Infusions:   PRN Meds: acetaminophen **OR** acetaminophen  Allergies:   No Known Allergies  Social History:   Social History   Socioeconomic History   Marital status: Married    Spouse name: Not on file   Number of children: Not on file   Years of education: Not on file   Highest education level: Not on file  Occupational History   Not on file  Tobacco Use   Smoking status: Former Smoker    Types: Cigarettes    Quit date: 04/09/1966    Years since quitting: 53.1   Smokeless tobacco: Former Neurosurgeon  Substance and Sexual Activity   Alcohol use: Yes    Alcohol/week: 0.0 standard drinks   Drug use: No   Sexual activity: Not on file  Other Topics Concern   Not on file  Social History Narrative   Married.   [place] veteran.   Moved back to [place] from [place].    Social Determinants of Health   Financial Resource Strain:    Difficulty of Paying Living Expenses: Not on file  Food Insecurity:    Worried About Charity fundraiser in the Last Year: Not on file   [employer] of Food in the Last Year: Not on file  Transportation Needs:    Lack of Transportation (Medical): Not on file   Lack of Transportation (Non-Medical): Not on file  Physical Activity:    Days of Exercise per Week: Not on file   Minutes of Exercise per Session: Not on file  Stress:    Feeling of Stress : Not on file  Social Connections:    Frequency of Communication with Friends and Family: Not on file   Frequency of  Social Gatherings with Friends and Family: Not on file   Attends Religious Services: Not on file   Active Member of Clubs or Organizations: Not on file   Attends Archivist Meetings: Not on file   Marital Status: Not on file  Intimate Partner Violence:    Fear of Current or Ex-Partner: Not on file   Emotionally Abused: Not on file   Physically Abused: Not on file   Sexually Abused: Not on file    Family History:   Family History  Problem Relation Age of Onset   Colon cancer Brother        1 brother   Cancer Father        bladder cancer     ROS:  Please see the history of present illness.  All other ROS reviewed and negative.     Physical Exam/Data:   Vitals:   05/22/19 0415 05/22/19 0442 05/22/19 0444 05/22/19 0740  BP: 127/76  123/83 133/88  Pulse: 78  90 83  Resp: $Remo'17  18 15  'Ujvpk$ Temp:   98.2 F (36.8 C) 98.8 F (37.1 C)  TempSrc:   Oral Oral  SpO2: 97%  100% 96%  Weight:  94.4 kg    Height:  $Remove'5\' 7"'XuobpBG$  (1.702 m)      Intake/Output Summary (Last 24 hours) at 05/22/2019 1212 Last data filed at 05/22/2019 0738 Gross per 24 hour  Intake 480 ml  Output --  Net 480 ml   Last 3 Weights 05/22/2019 05/21/2019 09/21/2016  Weight (lbs) 208 lb 3.2 oz 207 lb 201 lb 6.4 oz  Weight (kg) 94.439 kg 93.895 kg 91.354 kg     Body mass index is 32.61 kg/m.  General:  Well nourished, well developed, in no acute distress HEENT: normal Lymph: no adenopathy Neck: no JVD Endocrine:  No thryomegaly Vascular: No carotid bruits; FA pulses 2+ bilaterally without bruits  Cardiac:  normal S1, S2; RRR; 3/6 diastolic and systolic murmur Lungs:  clear to auscultation bilaterally, no wheezing, rhonchi or rales  Abd: soft, nontender, no hepatomegaly  Ext: no edema Musculoskeletal:  No deformities, BUE and BLE strength normal and equal Skin: warm and dry  Neuro:  CNs 2-12 intact, no focal abnormalities noted Psych:  Normal affect   EKG:  The EKG was personally reviewed and  demonstrates:  SR at rate of 80 bpm Telemetry:  Telemetry was personally reviewed and demonstrates: Sinus rhythm at rate of 80s  Relevant CV Studies:  Reviewed echocardiogram from 2015 Pending echocardiogram this admission  Laboratory Data:  High Sensitivity  Troponin:   Recent Labs  Lab 05/22/19 0002 05/22/19 0222 05/22/19 0706  TROPONINIHS 66* 59* 81*     Chemistry Recent Labs  Lab 05/21/19 2059 05/22/19 0706  NA 137 138  K 4.6 5.7*  CL 105 106  CO2 23 24  GLUCOSE 96 113*  BUN 18 28*  CREATININE 1.66* 1.88*  CALCIUM 8.5* 8.6*  GFRNONAA 39* 34*  GFRAA 45* 39*  ANIONGAP 9 8    Recent Labs  Lab 05/22/19 0706  PROT 5.8*  ALBUMIN 3.2*  AST 31  ALT 31  ALKPHOS 65  BILITOT 0.8   Hematology Recent Labs  Lab 05/21/19 2059 05/22/19 0002 05/22/19 0706  WBC 7.6 7.3 6.1  RBC 3.55* 3.54* 3.11*  HGB 11.7* 11.6* 10.1*  HCT 34.9* 34.9* 30.4*  MCV 98.3 98.6 97.7  MCH 33.0 32.8 32.5  MCHC 33.5 33.2 33.2  RDW 13.7 13.7 13.6  PLT 175 190 179   BNP Recent Labs  Lab 05/22/19 0003  BNP 60.1    DDimer  Recent Labs  Lab 05/22/19 0706  DDIMER <0.27    Radiology/Studies:  DG Chest Port 1 View  Result Date: 05/22/2019 CLINICAL DATA:  Shortness of breath EXAM: PORTABLE CHEST 1 VIEW COMPARISON:  None. FINDINGS: Normal heart size and mild aortic tortuosity. There is no edema, consolidation, effusion, or pneumothorax. Calcified granuloma appearance over the right mid chest. IMPRESSION: No evidence of active disease. Electronically Signed   By: Monte Fantasia M.D.   On: 05/22/2019 06:01    Assessment and Plan:   Shortness of breath/elevated troponin -His episode yesterday consistent with likely vasovagal or panic attack etiology.  He had no associated palpitation or chest pain.  His shortness of breath resolved within 5 minutes.  - - High-sensitivity troponin 66>>59>>81.  No ischemic EKG changes.  Normal D-dimer and BNP. No clear explanation of elevated  enzymes. -At baseline patient is very active and did not have any symptoms concerning for angina or CHF.Check orthostatic vitals.  -Will not plan any inpatient ischemic evaluation unless severely abnormal echocardiogram  2.  mitral regurgitation -Echocardiogram in 2015 showed mild MR -Pending echocardiogram this admission  3.  Epistasis/anemia -Hemoglobin downtrending. -Consider ENT eval  4. HTN - BP stable on home Varapamil  5.  Carotid bruit -We will get carotid Doppler - could be radiation of mumur  Disposition: Echo done pending reading and pending carotid Doppler while admitted.  Dr. Eliseo Squires will evaluate patient today in detail mind ENT evaluation inpatient versus outpatient.  Will tentatively go ahead and order stress test tomorrow morning.  If he is discharged today please send a message to cardmaster to schedule outpatient stress test.  For questions or updates, please contact [place] Please consult [email or web address] for contact info under     SignedLeanor Kail, PA  05/22/2019 12:12 PM  Agree with note by Robbie Lis PA-C  Agree with note by Robbie Lis.  We are asked to see this delightful 79 year old gentleman with a history of hypertension, CKD who has had some dyspnea on exertion and epistaxis.  He denies chest pain.  He smoked remotely.  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  His troponins are relatively low and flat and his EKG shows no acute changes.  On exam he is got an apical systolic murmur as well as an outflow tract murmur and carotid bruits which may be transmitted from his heart.  A 2D echo is pending.  Recommend carotid  Dopplers and pharmacologic Myoview stress test to risk stratify him.  Lorretta Harp, M.D., [place], [facility], [place] [address] [place], [place]  [address]  [phone] 05/22/2019 2:25 PM

## 2019-05-22 NOTE — ED Notes (Signed)
Pt resting comfortably

## 2019-05-23 ENCOUNTER — Observation Stay (HOSPITAL_COMMUNITY): Payer: No Typology Code available for payment source

## 2019-05-23 DIAGNOSIS — R55 Syncope and collapse: Secondary | ICD-10-CM | POA: Diagnosis present

## 2019-05-23 DIAGNOSIS — Z7982 Long term (current) use of aspirin: Secondary | ICD-10-CM | POA: Diagnosis not present

## 2019-05-23 DIAGNOSIS — Z85828 Personal history of other malignant neoplasm of skin: Secondary | ICD-10-CM | POA: Diagnosis not present

## 2019-05-23 DIAGNOSIS — I083 Combined rheumatic disorders of mitral, aortic and tricuspid valves: Secondary | ICD-10-CM | POA: Diagnosis present

## 2019-05-23 DIAGNOSIS — Z20822 Contact with and (suspected) exposure to covid-19: Secondary | ICD-10-CM | POA: Diagnosis present

## 2019-05-23 DIAGNOSIS — R42 Dizziness and giddiness: Secondary | ICD-10-CM | POA: Diagnosis present

## 2019-05-23 DIAGNOSIS — R0600 Dyspnea, unspecified: Secondary | ICD-10-CM | POA: Diagnosis present

## 2019-05-23 DIAGNOSIS — Z86010 Personal history of colonic polyps: Secondary | ICD-10-CM | POA: Diagnosis not present

## 2019-05-23 DIAGNOSIS — F319 Bipolar disorder, unspecified: Secondary | ICD-10-CM | POA: Diagnosis present

## 2019-05-23 DIAGNOSIS — Z87891 Personal history of nicotine dependence: Secondary | ICD-10-CM | POA: Diagnosis not present

## 2019-05-23 DIAGNOSIS — D631 Anemia in chronic kidney disease: Secondary | ICD-10-CM | POA: Diagnosis present

## 2019-05-23 DIAGNOSIS — Z79899 Other long term (current) drug therapy: Secondary | ICD-10-CM | POA: Diagnosis not present

## 2019-05-23 DIAGNOSIS — M109 Gout, unspecified: Secondary | ICD-10-CM | POA: Diagnosis present

## 2019-05-23 DIAGNOSIS — R0609 Other forms of dyspnea: Secondary | ICD-10-CM | POA: Diagnosis present

## 2019-05-23 DIAGNOSIS — R079 Chest pain, unspecified: Secondary | ICD-10-CM

## 2019-05-23 DIAGNOSIS — D693 Immune thrombocytopenic purpura: Secondary | ICD-10-CM | POA: Diagnosis present

## 2019-05-23 DIAGNOSIS — Z79890 Hormone replacement therapy (postmenopausal): Secondary | ICD-10-CM | POA: Diagnosis not present

## 2019-05-23 DIAGNOSIS — R0602 Shortness of breath: Secondary | ICD-10-CM | POA: Diagnosis present

## 2019-05-23 DIAGNOSIS — I129 Hypertensive chronic kidney disease with stage 1 through stage 4 chronic kidney disease, or unspecified chronic kidney disease: Secondary | ICD-10-CM | POA: Diagnosis present

## 2019-05-23 DIAGNOSIS — R040 Epistaxis: Secondary | ICD-10-CM | POA: Diagnosis present

## 2019-05-23 DIAGNOSIS — N183 Chronic kidney disease, stage 3 unspecified: Secondary | ICD-10-CM | POA: Diagnosis present

## 2019-05-23 DIAGNOSIS — E039 Hypothyroidism, unspecified: Secondary | ICD-10-CM | POA: Diagnosis present

## 2019-05-23 DIAGNOSIS — Z8673 Personal history of transient ischemic attack (TIA), and cerebral infarction without residual deficits: Secondary | ICD-10-CM | POA: Diagnosis not present

## 2019-05-23 LAB — CBC
HCT: 27.3 % — ABNORMAL LOW (ref 39.0–52.0)
Hemoglobin: 9.2 g/dL — ABNORMAL LOW (ref 13.0–17.0)
MCH: 33.1 pg (ref 26.0–34.0)
MCHC: 33.7 g/dL (ref 30.0–36.0)
MCV: 98.2 fL (ref 80.0–100.0)
Platelets: 167 10*3/uL (ref 150–400)
RBC: 2.78 MIL/uL — ABNORMAL LOW (ref 4.22–5.81)
RDW: 13.8 % (ref 11.5–15.5)
WBC: 7 10*3/uL (ref 4.0–10.5)
nRBC: 0 % (ref 0.0–0.2)

## 2019-05-23 LAB — NM MYOCAR MULTI W/SPECT W/WALL MOTION / EF
Estimated workload: 1 METS
Exercise duration (min): 0 min
Exercise duration (sec): 0 s
MPHR: 143 {beats}/min
Peak HR: 87 {beats}/min
Percent HR: 60 %
Rest HR: 70 {beats}/min

## 2019-05-23 LAB — BASIC METABOLIC PANEL
Anion gap: 8 (ref 5–15)
BUN: 27 mg/dL — ABNORMAL HIGH (ref 8–23)
CO2: 26 mmol/L (ref 22–32)
Calcium: 8.7 mg/dL — ABNORMAL LOW (ref 8.9–10.3)
Chloride: 104 mmol/L (ref 98–111)
Creatinine, Ser: 1.65 mg/dL — ABNORMAL HIGH (ref 0.61–1.24)
GFR calc Af Amer: 46 mL/min — ABNORMAL LOW (ref >60)
GFR calc non Af Amer: 39 mL/min — ABNORMAL LOW (ref >60)
Glucose, Bld: 96 mg/dL (ref 70–99)
Potassium: 4.7 mmol/L (ref 3.5–5.1)
Sodium: 138 mmol/L (ref 135–145)

## 2019-05-23 LAB — MAGNESIUM: Magnesium: 2.2 mg/dL (ref 1.7–2.4)

## 2019-05-23 LAB — GLUCOSE, CAPILLARY: Glucose-Capillary: 93 mg/dL (ref 70–99)

## 2019-05-23 LAB — HEMOGLOBIN AND HEMATOCRIT, BLOOD
HCT: 29.3 % — ABNORMAL LOW (ref 39.0–52.0)
Hemoglobin: 9.7 g/dL — ABNORMAL LOW (ref 13.0–17.0)

## 2019-05-23 LAB — TROPONIN I (HIGH SENSITIVITY): Troponin I (High Sensitivity): 81 ng/L — ABNORMAL HIGH (ref <18)

## 2019-05-23 MED ORDER — TECHNETIUM TC 99M TETROFOSMIN IV KIT
31.5000 | PACK | Freq: Once | INTRAVENOUS | Status: AC | PRN
  Administered 2019-05-23: 31.5 via INTRAVENOUS

## 2019-05-23 MED ORDER — REGADENOSON 0.4 MG/5ML IV SOLN
0.4000 mg | Freq: Once | INTRAVENOUS | Status: AC
  Filled 2019-05-23: qty 5

## 2019-05-23 MED ORDER — TECHNETIUM TC 99M TETROFOSMIN IV KIT
10.5800 | PACK | Freq: Once | INTRAVENOUS | Status: AC | PRN
  Administered 2019-05-23: 10.58 via INTRAVENOUS

## 2019-05-23 MED ORDER — REGADENOSON 0.4 MG/5ML IV SOLN
INTRAVENOUS | Status: AC
  Administered 2019-05-23: 0.4 mg via INTRAVENOUS
  Filled 2019-05-23: qty 5

## 2019-05-23 NOTE — Progress Notes (Signed)
   Patient presented for a nuclear stress test today. No immediate complications. Stress imaging is pending at this time.   Preliminary EKG findings may be listed in the chart, but the stress test result will not be finalized until perfusion imaging is complete.  Darreld Mclean, PA-C 05/23/2019 9:41 AM

## 2019-05-23 NOTE — Progress Notes (Addendum)
Progress Note  Patient Name: Dalton Roy Date of Encounter: 05/23/2019  Primary Cardiologist: No primary care provider on file.   Subjective   Saw patient down in nuclear medicine during stress test. No shortness of breath at rest. No chest pain. No palpitations. Lightheadedness/dizziness have improved.   Inpatient Medications    Scheduled Meds:  allopurinol  300 mg Oral Daily   atorvastatin  20 mg Oral QHS   DULoxetine  60 mg Oral BID   gabapentin  400 mg Oral BID   levothyroxine  75 mcg Oral QAC breakfast   pantoprazole  40 mg Oral Daily   sodium chloride flush  3 mL Intravenous Once   verapamil  120 mg Oral QHS   Continuous Infusions:   PRN Meds: acetaminophen **OR** acetaminophen   Vital Signs    Vitals:   05/23/19 0904 05/23/19 0929 05/23/19 0931 05/23/19 0933  BP: (!) 142/81 (!) 144/82 128/76 129/76  Pulse:      Resp:      Temp:      TempSrc:      SpO2:      Weight:      Height:        Intake/Output Summary (Last 24 hours) at 05/23/2019 0944 Last data filed at 05/23/2019 0700 Gross per 24 hour  Intake 600 ml  Output 1925 ml  Net -1325 ml   Last 3 Weights 05/23/2019 05/22/2019 05/21/2019  Weight (lbs) 208 lb 1.6 oz 208 lb 3.2 oz 207 lb  Weight (kg) 94.394 kg 94.439 kg 93.895 kg      Telemetry    Unable to review telemetry down in nuclear medicine but in normal sinus rhythm throughout stress test. - Personally Reviewed  ECG    No new ECG tracing today. - Personally Reviewed  Physical Exam   GEN: No acute distress.   Neck: Supple. Cardiac: RRR. III/VI systolic murmur noted. Respiratory: Clear to auscultation bilaterally. No wheezes, rhonchi, or rales. GI: Soft, non-tender, non-distended. MS: No edema. No deformity. Skin: Warm and dry. Neuro:  No focal deficits. Psych: Normal affect. Responds appropriately.  Labs    High Sensitivity Troponin:   Recent Labs  Lab 05/22/19 0002 05/22/19 0222 05/22/19 0706  TROPONINIHS 66* 59* 81*        Chemistry Recent Labs  Lab 05/21/19 2059 05/22/19 0706 05/23/19 0538  NA 137 138 138  K 4.6 5.7* 4.7  CL 105 106 104  CO2 $Re'23 24 26  'otE$ GLUCOSE 96 113* 96  BUN 18 28* 27*  CREATININE 1.66* 1.88* 1.65*  CALCIUM 8.5* 8.6* 8.7*  PROT  --  5.8*  --   ALBUMIN  --  3.2*  --   AST  --  31  --   ALT  --  31  --   ALKPHOS  --  65  --   BILITOT  --  0.8  --   GFRNONAA 39* 34* 39*  GFRAA 45* 39* 46*  ANIONGAP $RemoveB'9 8 8     'xcYdqEBZ$ Hematology Recent Labs  Lab 05/22/19 0002 05/22/19 0706 05/23/19 0538  WBC 7.3 6.1 7.0  RBC 3.54* 3.11* 2.78*  HGB 11.6* 10.1* 9.2*  HCT 34.9* 30.4* 27.3*  MCV 98.6 97.7 98.2  MCH 32.8 32.5 33.1  MCHC 33.2 33.2 33.7  RDW 13.7 13.6 13.8  PLT 190 179 167    BNP Recent Labs  Lab 05/22/19 0003  BNP 60.1     DDimer  Recent Labs  Lab 05/22/19 0706  DDIMER <0.27  Radiology    DG Chest Port 1 View  Result Date: 05/22/2019 CLINICAL DATA:  Shortness of breath EXAM: PORTABLE CHEST 1 VIEW COMPARISON:  None. FINDINGS: Normal heart size and mild aortic tortuosity. There is no edema, consolidation, effusion, or pneumothorax. Calcified granuloma appearance over the right mid chest. IMPRESSION: No evidence of active disease. Electronically Signed   By: Monte Fantasia M.D.   On: 05/22/2019 06:01   ECHOCARDIOGRAM COMPLETE  Result Date: 05/22/2019    ECHOCARDIOGRAM REPORT   Patient Name:   Dalton Roy Date of Exam: 05/22/2019 Medical Rec #:  [id number]      Height:       67.0 in Accession #:    [id number]     Weight:       208.2 lb Date of Birth:  October 15, 1940      BSA:          2.06 m Patient Age:    79 years       BP:           138/80 mmHg Patient Gender: M              HR:           76 bpm. 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 Chronic kidney disease; Risk                 Factors:Hypertension.   Sonographer:    Johny Chess Referring Phys: [place]  1. Left ventricular ejection fraction, by estimation, is 60 to 65%. The left ventricle has normal function. The left ventricle has no regional wall motion abnormalities. Left ventricular diastolic parameters were normal.  2. Right ventricular systolic function is normal. The right ventricular size is normal. There is mildly elevated pulmonary artery systolic pressure.  3. The mitral valve is degenerative. Mild mitral valve regurgitation. No evidence of mitral stenosis.  4. The aortic valve is tricuspid. Aortic valve regurgitation is moderate. Moderate aortic valve stenosis.  5. Distal aortic root measured off axis at 4.5 cm . aortic dilatation noted. 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 FINDINGS  Left Ventricle: Left ventricular ejection fraction, by estimation, is 60 to 65%. The left ventricle has normal function. The left ventricle has no regional wall motion abnormalities. The left ventricular internal cavity size was normal in size. There is  no left ventricular hypertrophy. Left ventricular diastolic parameters were normal. Right Ventricle: The right ventricular size is normal. No increase in right ventricular wall thickness. Right ventricular systolic function is normal. There is mildly elevated pulmonary artery systolic pressure. The tricuspid regurgitant velocity is 2.82  m/s, and with an assumed right atrial pressure of 3 mmHg, the estimated right ventricular systolic pressure is 71.6 mmHg. Left Atrium: Left atrial size was normal in size. Right Atrium: Right atrial size was normal in size. Pericardium: There is no evidence of pericardial effusion. Mitral Valve: The mitral valve is degenerative in appearance. There is mild thickening of the mitral valve leaflet(s). There is mild calcification of the mitral valve leaflet(s). Normal mobility of the mitral valve leaflets. Mild mitral  valve regurgitation. No evidence of mitral valve stenosis. Tricuspid Valve: The tricuspid valve is normal in structure.  Tricuspid valve regurgitation is mild . No evidence of tricuspid stenosis. Aortic Valve: The aortic valve is tricuspid. . There is moderate thickening and moderate calcification of the aortic valve. Aortic valve regurgitation is moderate. Moderate aortic stenosis is present. There is moderate thickening of the aortic valve. There is moderate calcification of the aortic valve. Aortic valve mean gradient measures 25.0 mmHg. Aortic valve peak gradient measures 44.6 mmHg. Aortic valve area, by VTI measures 1.28 cm. Pulmonic Valve: The pulmonic valve was normal in structure. Pulmonic valve regurgitation is trivial. No evidence of pulmonic stenosis. Aorta: Distal aortic root measured off axis at 4.5 cm. The aortic root is normal in size and structure and aortic dilatation noted. 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 IAS/Shunts: No atrial level shunt detected by color flow Doppler.  LEFT VENTRICLE PLAX 2D LVIDd:         4.90 cm  Diastology LVIDs:         3.20 cm  LV e' lateral:   8.49 cm/s LV PW:         1.10 cm  LV E/e' lateral: 7.6 LV IVS:        1.10 cm  LV e' medial:    5.55 cm/s LVOT diam:     1.80 cm  LV E/e' medial:  11.7 LV SV:         80.03 ml LV SV Index:   33.49 LVOT Area:     2.54 cm  RIGHT VENTRICLE RV S prime:     14.70 cm/s TAPSE (M-mode): 2.3 cm LEFT ATRIUM             Index       RIGHT ATRIUM           Index LA diam:        3.60 cm 1.75 cm/m  RA Area:     13.00 cm LA Vol (A2C):   69.7 ml 33.88 ml/m RA Volume:   24.80 ml  12.06 ml/m LA Vol (A4C):   54.0 ml 26.25 ml/m LA Biplane Vol: 62.7 ml 30.48 ml/m  AORTIC VALVE AV Area (Vmax):    1.24 cm AV Area (Vmean):   1.18 cm AV Area (VTI):     1.28 cm AV Vmax:           334.00 cm/s AV Vmean:          235.000 cm/s AV VTI:            0.624 m AV Peak Grad:      44.6 mmHg AV Mean Grad:      25.0 mmHg LVOT  Vmax:         162.50 cm/s LVOT Vmean:        109.000 cm/s LVOT VTI:          0.314 m LVOT/AV VTI ratio: 0.50  AORTA Ao Root diam: 3.60 cm MITRAL VALVE               TRICUSPID VALVE MV Area (PHT): 3.31 cm    TR Peak grad:   31.8 mmHg MV Decel Time: 229 msec    TR Vmax:        282.00 cm/s MV E velocity: 64.90 cm/s MV A velocity: 86.90 cm/s  SHUNTS MV E/A ratio:  0.75        Systemic VTI:  0.31 m  Systemic Diam: 1.80 cm Jenkins Rouge MD Electronically signed by Jenkins Rouge MD Signature Date/Time: 05/22/2019/4:41:01 PM    Final    VAS US CAROTID  Result Date: 05/22/2019 Carotid Arterial Duplex Study Indications:  Bilateral bruits. Risk Factors: Hypertension. Performing Technologist: Maudry Mayhew MHA, RDMS, RVT, RDCS  Examination Guidelines: A complete evaluation includes B-mode imaging, spectral Doppler, color Doppler, and power Doppler as needed of all accessible portions of each vessel. Bilateral testing is considered an integral part of a complete examination. Limited examinations for reoccurring indications may be performed as noted.  Right Carotid Findings: +----------+--------+--------+--------+------------------+--------+           PSV cm/sEDV cm/sStenosisPlaque DescriptionComments +----------+--------+--------+--------+------------------+--------+ CCA Prox  63      13                                         +----------+--------+--------+--------+------------------+--------+ CCA Distal58      15                                         +----------+--------+--------+--------+------------------+--------+ ICA Prox  68      21                                         +----------+--------+--------+--------+------------------+--------+ ICA Distal97      34                                         +----------+--------+--------+--------+------------------+--------+ ECA       50      10                                          +----------+--------+--------+--------+------------------+--------+ +----------+--------+-------+----------------+-------------------+           PSV cm/sEDV cmsDescribe        Arm Pressure (mmHG) +----------+--------+-------+----------------+-------------------+ [id number]             Multiphasic, WNL                    +----------+--------+-------+----------------+-------------------+ +---------+--------+--+--------+--+---------+ VertebralPSV cm/s66EDV cm/s26Antegrade +---------+--------+--+--------+--+---------+  Left Carotid Findings: +----------+--------+-------+--------+--------------------------------+--------+           PSV cm/sEDV    StenosisPlaque Description              Comments                   cm/s                                                    +----------+--------+-------+--------+--------------------------------+--------+ CCA Prox  83      28                                                      +----------+--------+-------+--------+--------------------------------+--------+  CCA Distal89      28                                                      +----------+--------+-------+--------+--------------------------------+--------+ ICA Prox  71      17             smooth, heterogenous and                                                  calcific                                 +----------+--------+-------+--------+--------------------------------+--------+ ICA Distal115     46             smooth and heterogenous                  +----------+--------+-------+--------+--------------------------------+--------+ ECA       103     23                                                      +----------+--------+-------+--------+--------------------------------+--------+ +----------+--------+--------+--------------+-------------------+           PSV cm/sEDV cm/sDescribe      Arm Pressure (mmHG)  +----------+--------+--------+--------------+-------------------+ Subclavian                Not identified                    +----------+--------+--------+--------------+-------------------+ +---------+--------+--+--------+--+---------+ VertebralPSV cm/s57EDV cm/s15Antegrade +---------+--------+--+--------+--+---------+   Summary: Right Carotid: Velocities in the right ICA are consistent with a 1-39% stenosis. Left Carotid: Velocities in the left ICA are consistent with a 1-39% stenosis. Vertebrals:  Bilateral vertebral arteries demonstrate antegrade flow. Subclavians: Left subclavian artery was not visualized. Normal flow hemodynamics              were seen in the right subclavian artery. *See table(s) above for measurements and observations.  Electronically signed by Monica Martinez MD on 05/22/2019 at 6:16:07 PM.    Final     Cardiac Studies   Echocardiogram 05/22/2019: Impressions:  1. Left ventricular ejection fraction, by estimation, is 60 to 65%. The  left ventricle has normal function. The left ventricle has no regional  wall motion abnormalities. Left ventricular diastolic parameters were  normal.   2. Right ventricular systolic function is normal. The right ventricular  size is normal. There is mildly elevated pulmonary artery systolic  pressure.   3. The mitral valve is degenerative. Mild mitral valve regurgitation. No  evidence of mitral stenosis.   4. The aortic valve is tricuspid. Aortic valve regurgitation is moderate.  Moderate aortic valve stenosis.   5. Distal aortic root measured off axis at 4.5 cm . aortic dilatation  noted. There is mild to moderate dilatation of the aortic root measuring  42 mm. _______________  Carotid Ultrasounds 05/22/2019: Summary:  Right Carotid: Velocities in the right ICA are consistent with a 1-39%  stenosis.   Left Carotid: Velocities in  the left ICA are consistent with a 1-39%  stenosis.   Vertebrals:  Bilateral vertebral  arteries demonstrate antegrade flow.  Subclavians: Left subclavian artery was not visualized. Normal flow  hemodynamics were seen in the right subclavian artery.   Patient Profile     79 y.o. male with a history of hypertension, mild mitral regurgitation on Echo in 2015, stroke, hypothyroidism, gout, ITP, CKD stage III, and depression who is being seen for evaluation of dyspnea on exertion.   Assessment & Plan    Dyspnea on Exertion - High-sensitivity troponin minimally elevated at 66 >> 59 >> 81.  - EKG shows mild T wave inversion in lead aVL. - D-dimer and BNP normal.  - Echo showed LVEF of 60-65% with normal wall motion and diastolic parameters. Moderate aortic stenosis, moderate aortic insufficiency, and mild mitral regurgitation also noted. 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  - Patient underwent Lexiscan Myoview today. Tolerated study well. Results pending.  Aortic Stenosis/Insufficiency - Noted to have moderate AS/AI on Echo this admission with dilated aortic root. - Will discuss with MD about whether any additional imaging is needed as outpatient.  Hypertension - BP well controlled. - Continue Verapamil.   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 - Continue statin. Will hold off on adding Aspirin right now given epistasis and dropping hemoglobin.  Epistasis/Anemia - Management per primary team.   For questions or updates, please contact [place] Please consult [email or web address] for contact info under        Signed, Darreld Mclean, PA-C  05/23/2019, 9:44 AM    Nuclear study pending Exam with AS/AR murmur will need right and left heart cath if myovue shows evidence of CAD If not would f/u with echo in 6 months as outpatient   Jenkins Rouge MD [facility]

## 2019-05-23 NOTE — Progress Notes (Signed)
Nuclear study is normal EF 56% No ischemia or infarct Will arrange outpatient f/u with Dr Gwenlyn Found For his AS/AR and echo in 6 months   Jenkins Rouge MD [facility]

## 2019-05-23 NOTE — Discharge Summary (Signed)
Physician Discharge Summary  Dalton Roy:[id number] DOB: 04/26/1940 DOA: 05/21/2019  PCP: [facility]  Admit date: 05/21/2019 Discharge date: 05/23/2019  Admitted From: Home Discharge disposition: Home   Recommendations for Outpatient Follow-Up:   1. Outpatient ENT referral if continues to have nosebleed issues 2. CBC 1 week  3. Low risk stress test but will need outpatient follow-up for his AS/AR with an echo in 6 months   Discharge Diagnosis:   Principal Problem:   Near syncope Active Problems:   Essential hypertension   Hypothyroidism   Syncope   Dyspnea    Discharge Condition: Improved.  Diet recommendation: Low sodium, heart healthy.   Wound care: None.  Code status: Full.   History of Present Illness:   Dalton Roy is a 79 y.o. male with history of hypertension, stroke, gout, hypothyroidism, ITP, depression presents to the ER because of sudden onset of shortness of breath.  Patient states over the last 2 weeks he has been having epistaxis off and on which became more severe in the last 2 days.  He may have lost at least a pint of blood as per the patient.  Yesterday he became more short of breath with minimal exertion with feeling of that he is going to pass out when he tries to exert.  Has not had any chest pain productive cough fever chills.  No new medications.  Has not had any nausea vomiting or diarrhea.  Symptoms only on exertion.  ED Course: In the ER patient was afebrile not hypoxic EKG shows normal sinus rhythm and had some epistaxis on the right naris which was self-limited.  Covid test was negative.  Chest x-ray is pending.  On exam patient does have a systolic heart murmur.  Labs reveal creatinine 1.6 increased from baseline of 1.3 hemoglobin 1.7 platelets are normal high sensitive troponin was 66 and 59.  BNP 60.1.  Patient admitted for further management of near syncope episode with exertional shortness of  breath.   Hospital Course by Problem:   Dyspnea on Exertion -Resolved - D-dimer and BNP normal.  - Echo showed LVEF of 60-65% with normal wall motion and diastolic parameters. Moderate aortic stenosis, moderate aortic insufficiency, and mild mitral regurgitation also noted. 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  -Low risk stress test  Aortic Stenosis/Insufficiency -Will need to repeat echo in 6 months and outpatient cardiology follow-up  Hypertension - BP well controlled. - Continue Verapamil.   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 - Continue statin. Will hold off on adding Aspirin right now given epistasis and dropping hemoglobin.  Epistasis/Anemia -Seems resolved CBC stable Outpatient follow-up with ENT -Resume aspirin once nosebleed resolved fully at next office visit    Medical Consultants:   Cardiology  Discharge Exam:   Vitals:   05/23/19 0933 05/23/19 1133  BP: 129/76 (!) 159/90  Pulse:  91  Resp:  20  Temp:  98.2 F (36.8 C)  SpO2:     Vitals:   05/23/19 0929 05/23/19 0931 05/23/19 0933 05/23/19 1133  BP: (!) 144/82 128/76 129/76 (!) 159/90  Pulse:    91  Resp:    20  Temp:    98.2 F (36.8 C)  TempSrc:    Oral  SpO2:      Weight:      Height:        General exam: Appears calm and comfortable.   The results of significant diagnostics  from this hospitalization (including imaging, microbiology, ancillary and laboratory) are listed below for reference.     Procedures and Diagnostic Studies:   DG Chest Port 1 View  Result Date: 05/22/2019 CLINICAL DATA:  Shortness of breath EXAM: PORTABLE CHEST 1 VIEW COMPARISON:  None. FINDINGS: Normal heart size and mild aortic tortuosity. There is no edema, consolidation, effusion, or pneumothorax. Calcified granuloma appearance over the right mid chest. IMPRESSION: No evidence of active disease. Electronically Signed   By: Monte Fantasia M.D.   On: 05/22/2019 06:01    ECHOCARDIOGRAM COMPLETE  Result Date: 05/22/2019    ECHOCARDIOGRAM REPORT   Patient Name:   Dalton Roy Date of Exam: 05/22/2019 Medical Rec #:  [id number]      Height:       67.0 in Accession #:    [id number]     Weight:       208.2 lb Date of Birth:  12-May-1940      BSA:          2.06 m Patient Age:    48 years       BP:           138/80 mmHg Patient Gender: M              HR:           76 bpm. 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 Chronic kidney disease; Risk                 Factors:Hypertension.  Sonographer:    Johny Chess Referring Phys: [facility]  1. Left ventricular ejection fraction, by estimation, is 60 to 65%. The left ventricle has normal function. The left ventricle has no regional wall motion abnormalities. Left ventricular diastolic parameters were normal.  2. Right ventricular systolic function is normal. The right ventricular size is normal. There is mildly elevated pulmonary artery systolic pressure.  3. The mitral valve is degenerative. Mild mitral valve regurgitation. No evidence of mitral stenosis.  4. The aortic valve is tricuspid. Aortic valve regurgitation is moderate. Moderate aortic valve stenosis.  5. Distal aortic root measured off axis at 4.5 cm . aortic dilatation noted. 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 FINDINGS  Left Ventricle: Left ventricular ejection fraction, by estimation, is 60 to 65%. The left ventricle has normal function. The left ventricle has no regional wall motion abnormalities. The left ventricular internal cavity size was normal in size. There is  no left ventricular hypertrophy. Left ventricular diastolic parameters were normal. Right Ventricle: The right ventricular size is normal. No increase in right ventricular wall  thickness. Right ventricular systolic function is normal. There is mildly elevated pulmonary artery systolic pressure. The tricuspid regurgitant velocity is 2.82  m/s, and with an assumed right atrial pressure of 3 mmHg, the estimated right ventricular systolic pressure is 50.5 mmHg. Left Atrium: Left atrial size was normal in size. Right Atrium: Right atrial size was normal in size. Pericardium: There is no evidence of pericardial effusion. Mitral Valve: The mitral valve is degenerative in appearance. There is mild thickening of the mitral valve leaflet(s). There is mild calcification of the mitral valve leaflet(s). Normal mobility of the mitral valve  leaflets. Mild mitral valve regurgitation. No evidence of mitral valve stenosis. Tricuspid Valve: The tricuspid valve is normal in structure. Tricuspid valve regurgitation is mild . No evidence of tricuspid stenosis. Aortic Valve: The aortic valve is tricuspid. . There is moderate thickening and moderate calcification of the aortic valve. Aortic valve regurgitation is moderate. Moderate aortic stenosis is present. There is moderate thickening of the aortic valve. There is moderate calcification of the aortic valve. Aortic valve mean gradient measures 25.0 mmHg. Aortic valve peak gradient measures 44.6 mmHg. Aortic valve area, by VTI measures 1.28 cm. Pulmonic Valve: The pulmonic valve was normal in structure. Pulmonic valve regurgitation is trivial. No evidence of pulmonic stenosis. Aorta: Distal aortic root measured off axis at 4.5 cm. The aortic root is normal in size and structure and aortic dilatation noted. 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 IAS/Shunts: No atrial level shunt detected by color flow Doppler.  LEFT VENTRICLE PLAX 2D LVIDd:         4.90 cm  Diastology LVIDs:         3.20 cm  LV e' lateral:   8.49 cm/s LV PW:         1.10 cm  LV E/e' lateral: 7.6 LV IVS:        1.10 cm  LV e' medial:    5.55 cm/s LVOT diam:      1.80 cm  LV E/e' medial:  11.7 LV SV:         80.03 ml LV SV Index:   33.49 LVOT Area:     2.54 cm  RIGHT VENTRICLE RV S prime:     14.70 cm/s TAPSE (M-mode): 2.3 cm LEFT ATRIUM             Index       RIGHT ATRIUM           Index LA diam:        3.60 cm 1.75 cm/m  RA Area:     13.00 cm LA Vol (A2C):   69.7 ml 33.88 ml/m RA Volume:   24.80 ml  12.06 ml/m LA Vol (A4C):   54.0 ml 26.25 ml/m LA Biplane Vol: 62.7 ml 30.48 ml/m  AORTIC VALVE AV Area (Vmax):    1.24 cm AV Area (Vmean):   1.18 cm AV Area (VTI):     1.28 cm AV Vmax:           334.00 cm/s AV Vmean:          235.000 cm/s AV VTI:            0.624 m AV Peak Grad:      44.6 mmHg AV Mean Grad:      25.0 mmHg LVOT Vmax:         162.50 cm/s LVOT Vmean:        109.000 cm/s LVOT VTI:          0.314 m LVOT/AV VTI ratio: 0.50  AORTA Ao Root diam: 3.60 cm MITRAL VALVE               TRICUSPID VALVE MV Area (PHT): 3.31 cm    TR Peak grad:   31.8 mmHg MV Decel Time: 229 msec    TR Vmax:        282.00 cm/s MV E velocity: 64.90 cm/s MV A velocity: 86.90 cm/s  SHUNTS MV E/A ratio:  0.75        Systemic VTI:  0.31  m                            Systemic Diam: 1.80 cm Jenkins Rouge MD Electronically signed by Jenkins Rouge MD Signature Date/Time: 05/22/2019/4:41:01 PM    Final    VAS US CAROTID  Result Date: 05/22/2019 Carotid Arterial Duplex Study Indications:  Bilateral bruits. Risk Factors: Hypertension. Performing Technologist: Maudry Mayhew MHA, RDMS, RVT, RDCS  Examination Guidelines: A complete evaluation includes B-mode imaging, spectral Doppler, color Doppler, and power Doppler as needed of all accessible portions of each vessel. Bilateral testing is considered an integral part of a complete examination. Limited examinations for reoccurring indications may be performed as noted.  Right Carotid Findings: +----------+--------+--------+--------+------------------+--------+           PSV cm/sEDV cm/sStenosisPlaque DescriptionComments  +----------+--------+--------+--------+------------------+--------+ CCA Prox  63      13                                         +----------+--------+--------+--------+------------------+--------+ CCA Distal58      15                                         +----------+--------+--------+--------+------------------+--------+ ICA Prox  68      21                                         +----------+--------+--------+--------+------------------+--------+ ICA Distal97      34                                         +----------+--------+--------+--------+------------------+--------+ ECA       50      10                                         +----------+--------+--------+--------+------------------+--------+ +----------+--------+-------+----------------+-------------------+           PSV cm/sEDV cmsDescribe        Arm Pressure (mmHG) +----------+--------+-------+----------------+-------------------+ [id number]             Multiphasic, WNL                    +----------+--------+-------+----------------+-------------------+ +---------+--------+--+--------+--+---------+ VertebralPSV cm/s66EDV cm/s26Antegrade +---------+--------+--+--------+--+---------+  Left Carotid Findings: +----------+--------+-------+--------+--------------------------------+--------+           PSV cm/sEDV    StenosisPlaque Description              Comments                   cm/s                                                    +----------+--------+-------+--------+--------------------------------+--------+ CCA Prox  83      28                                                      +----------+--------+-------+--------+--------------------------------+--------+  CCA Distal89      28                                                      +----------+--------+-------+--------+--------------------------------+--------+ ICA Prox  71      17             smooth,  heterogenous and                                                  calcific                                 +----------+--------+-------+--------+--------------------------------+--------+ ICA Distal115     46             smooth and heterogenous                  +----------+--------+-------+--------+--------------------------------+--------+ ECA       103     23                                                      +----------+--------+-------+--------+--------------------------------+--------+ +----------+--------+--------+--------------+-------------------+           PSV cm/sEDV cm/sDescribe      Arm Pressure (mmHG) +----------+--------+--------+--------------+-------------------+ Subclavian                Not identified                    +----------+--------+--------+--------------+-------------------+ +---------+--------+--+--------+--+---------+ VertebralPSV cm/s57EDV cm/s15Antegrade +---------+--------+--+--------+--+---------+   Summary: Right Carotid: Velocities in the right ICA are consistent with a 1-39% stenosis. Left Carotid: Velocities in the left ICA are consistent with a 1-39% stenosis. Vertebrals:  Bilateral vertebral arteries demonstrate antegrade flow. Subclavians: Left subclavian artery was not visualized. Normal flow hemodynamics              were seen in the right subclavian artery. *See table(s) above for measurements and observations.  Electronically signed by Sherald Hess MD on 05/22/2019 at 6:16:07 PM.    Final      Labs:   Basic Metabolic Panel: Recent Labs  Lab 05/21/19 2059 05/21/19 2059 05/22/19 0706 05/23/19 0538 05/23/19 1315  NA 137  --  138 138  --   K 4.6   < > 5.7* 4.7  --   CL 105  --  106 104  --   CO2 23  --  24 26  --   GLUCOSE 96  --  113* 96  --   BUN 18  --  28* 27*  --   CREATININE 1.66*  --  1.88* 1.65*  --   CALCIUM 8.5*  --  8.6* 8.7*  --   MG  --   --   --   --  2.2   < > = values in this interval not  displayed.   GFR Estimated Creatinine Clearance: 41 mL/min (A) (by C-G formula based on SCr of 1.65 mg/dL (H)). Liver Function Tests: Recent Labs  Lab 05/22/19 0706  AST 31  ALT 31  ALKPHOS 65  BILITOT 0.8  PROT 5.8*  ALBUMIN 3.2*   No results for input(s): LIPASE, AMYLASE in the last 168 hours. No results for input(s): AMMONIA in the last 168 hours. Coagulation profile No results for input(s): INR, PROTIME in the last 168 hours.  CBC: Recent Labs  Lab 05/21/19 2059 05/22/19 0002 05/22/19 0706 05/23/19 0538 05/23/19 1315  WBC 7.6 7.3 6.1 7.0  --   NEUTROABS  --  5.4 4.2  --   --   HGB 11.7* 11.6* 10.1* 9.2* 9.7*  HCT 34.9* 34.9* 30.4* 27.3* 29.3*  MCV 98.3 98.6 97.7 98.2  --   PLT 175 190 179 167  --    Cardiac Enzymes: No results for input(s): CKTOTAL, CKMB, CKMBINDEX, TROPONINI in the last 168 hours. BNP: Invalid input(s): POCBNP CBG: Recent Labs  Lab 05/22/19 0611 05/22/19 1109 05/22/19 1633 05/22/19 2101 05/23/19 0607  GLUCAP 91 74 80 132* 93   D-Dimer Recent Labs    05/22/19 0706  DDIMER <0.27   Hgb A1c No results for input(s): HGBA1C in the last 72 hours. Lipid Profile No results for input(s): CHOL, HDL, LDLCALC, TRIG, CHOLHDL, LDLDIRECT in the last 72 hours. Thyroid function studies Recent Labs    05/21/19 2120  TSH 1.801   Anemia work up No results for input(s): VITAMINB12, FOLATE, FERRITIN, TIBC, IRON, RETICCTPCT in the last 72 hours. Microbiology Recent Results (from the past 240 hour(s))  Respiratory Panel by RT PCR (Flu A&B, Covid) - Nasopharyngeal Swab     Status: None   Collection Time: 05/22/19 12:36 AM   Specimen: Nasopharyngeal Swab  Result Value Ref Range Status   SARS Coronavirus 2 by RT PCR NEGATIVE NEGATIVE Final    Comment: (NOTE) SARS-CoV-2 target nucleic acids are NOT DETECTED. The SARS-CoV-2 RNA is generally detectable in upper respiratoy specimens during the acute phase of infection. The lowest concentration of  SARS-CoV-2 viral copies this assay can detect is 131 copies/mL. A negative result does not preclude SARS-Cov-2 infection and should not be used as the sole basis for treatment or other patient management decisions. A negative result may occur with  improper specimen collection/handling, submission of specimen other than nasopharyngeal swab, presence of viral mutation(s) within the areas targeted by this assay, and inadequate number of viral copies (<131 copies/mL). A negative result must be combined with clinical observations, patient history, and epidemiological information. The expected result is Negative. Fact Sheet for Patients:  PinkCheek.be Fact Sheet for Healthcare Providers:  GravelBags.it This test is not yet ap proved or cleared by the [place] FDA and  has been authorized for detection and/or diagnosis of SARS-CoV-2 by FDA under an Emergency Use Authorization (EUA). This EUA will remain  in effect (meaning this test can be used) for the duration of the COVID-19 declaration under Section 564(b)(1) of the Act, 21 U.S.C. section 360bbb-3(b)(1), unless the authorization is terminated or revoked sooner.    Influenza A by PCR NEGATIVE NEGATIVE Final   Influenza B by PCR NEGATIVE NEGATIVE Final    Comment: (NOTE) The Xpert Xpress SARS-CoV-2/FLU/RSV assay is intended as an aid in  the diagnosis of influenza from Nasopharyngeal swab specimens and  should not be used as a sole basis for treatment. Nasal washings and  aspirates are unacceptable for Xpert Xpress SARS-CoV-2/FLU/RSV  testing. Fact Sheet for Patients: PinkCheek.be Fact Sheet for Healthcare Providers: GravelBags.it This test is not yet approved or cleared by the [place]  FDA and  has been authorized for detection and/or diagnosis of SARS-CoV-2 by  FDA under an Emergency Use Authorization (EUA).  This EUA will remain  in effect (meaning this test can be used) for the duration of the  Covid-19 declaration under Section 564(b)(1) of the Act, 21  U.S.C. section 360bbb-3(b)(1), unless the authorization is  terminated or revoked. Performed at [facility], [place] [address] [place], [place] [address]      Discharge Instructions:   Discharge Instructions    Diet - low sodium heart healthy   Complete by: As directed    Increase activity slowly   Complete by: As directed      Allergies as of 05/23/2019   No Known Allergies     Medication List    STOP taking these medications   aspirin 325 MG EC tablet     TAKE these medications   allopurinol 300 MG tablet Commonly known as: ZYLOPRIM TAKE 1 TABLET(300 MG) BY MOUTH DAILY What changed: See the new instructions.   ammonium lactate 12 % lotion Commonly known as: LAC-HYDRIN Apply 1 application topically as needed for dry skin.   atorvastatin 40 MG tablet Commonly known as: LIPITOR Take 20 mg by mouth at bedtime.   DULoxetine 60 MG capsule Commonly known as: CYMBALTA Take 60 mg by mouth 2 (two) times daily.   fluocinonide ointment 0.05 % Commonly known as: LIDEX Apply 1 application topically daily as needed (dry skin).   gabapentin 400 MG capsule Commonly known as: NEURONTIN Take 400 mg by mouth 2 (two) times daily.   hydrophilic ointment Apply 1 application topically as needed for dry skin.   levothyroxine 75 MCG tablet Commonly known as: SYNTHROID Take 1 tablet (75 mcg total) by mouth daily before breakfast.   omeprazole 20 MG capsule Commonly known as: PRILOSEC Take 20 mg by mouth daily.   SALICYLIC ACID EX Apply 1 application topically at bedtime.   sildenafil 50 MG tablet Commonly known as: VIAGRA Take 25 mg by mouth daily as needed for erectile dysfunction.   SUMAtriptan 100 MG tablet Commonly known as: IMITREX Take 50 mg by mouth every 2 (two) hours as needed for migraine (Pt takes 1  every night at bedtime). May repeat in 2 hours if headache persists or recurs.   verapamil 120 MG 24 hr capsule Commonly known as: VERELAN PM Take 120 mg by mouth at bedtime.      Follow-up [facility]. Call in 1 day.   Specialty: General Practice Why: for follow up Contact information: [address] [place] [address] [phone]            Time coordinating discharge: 35 min  Signed:  Geradine Girt DO  [facility] 05/23/2019, 2:04 PM

## 2019-05-29 ENCOUNTER — Other Ambulatory Visit: Payer: Self-pay

## 2019-05-29 ENCOUNTER — Encounter: Payer: Self-pay | Admitting: Emergency Medicine

## 2019-05-29 ENCOUNTER — Observation Stay
Admission: EM | Admit: 2019-05-29 | Discharge: 2019-05-30 | Disposition: A | Discharge status: Home / Self Care | Payer: No Typology Code available for payment source | Attending: Family Medicine | Admitting: Family Medicine

## 2019-05-29 DIAGNOSIS — I129 Hypertensive chronic kidney disease with stage 1 through stage 4 chronic kidney disease, or unspecified chronic kidney disease: Secondary | ICD-10-CM | POA: Insufficient documentation

## 2019-05-29 DIAGNOSIS — L409 Psoriasis, unspecified: Secondary | ICD-10-CM | POA: Diagnosis not present

## 2019-05-29 DIAGNOSIS — N183 Chronic kidney disease, stage 3 unspecified: Secondary | ICD-10-CM | POA: Insufficient documentation

## 2019-05-29 DIAGNOSIS — R040 Epistaxis: Secondary | ICD-10-CM | POA: Diagnosis present

## 2019-05-29 DIAGNOSIS — D62 Acute posthemorrhagic anemia: Secondary | ICD-10-CM | POA: Diagnosis present

## 2019-05-29 DIAGNOSIS — D500 Iron deficiency anemia secondary to blood loss (chronic): Secondary | ICD-10-CM

## 2019-05-29 DIAGNOSIS — R55 Syncope and collapse: Secondary | ICD-10-CM | POA: Diagnosis not present

## 2019-05-29 DIAGNOSIS — Z20822 Contact with and (suspected) exposure to covid-19: Secondary | ICD-10-CM | POA: Diagnosis not present

## 2019-05-29 DIAGNOSIS — Z7982 Long term (current) use of aspirin: Secondary | ICD-10-CM | POA: Insufficient documentation

## 2019-05-29 DIAGNOSIS — Z79899 Other long term (current) drug therapy: Secondary | ICD-10-CM | POA: Insufficient documentation

## 2019-05-29 DIAGNOSIS — Z86010 Personal history of colonic polyps: Secondary | ICD-10-CM | POA: Diagnosis not present

## 2019-05-29 DIAGNOSIS — Z79890 Hormone replacement therapy (postmenopausal): Secondary | ICD-10-CM | POA: Insufficient documentation

## 2019-05-29 DIAGNOSIS — E039 Hypothyroidism, unspecified: Secondary | ICD-10-CM | POA: Diagnosis not present

## 2019-05-29 DIAGNOSIS — I083 Combined rheumatic disorders of mitral, aortic and tricuspid valves: Secondary | ICD-10-CM | POA: Diagnosis not present

## 2019-05-29 DIAGNOSIS — I10 Essential (primary) hypertension: Secondary | ICD-10-CM | POA: Diagnosis present

## 2019-05-29 DIAGNOSIS — M109 Gout, unspecified: Secondary | ICD-10-CM | POA: Insufficient documentation

## 2019-05-29 DIAGNOSIS — Z85820 Personal history of malignant melanoma of skin: Secondary | ICD-10-CM | POA: Diagnosis not present

## 2019-05-29 DIAGNOSIS — F319 Bipolar disorder, unspecified: Secondary | ICD-10-CM | POA: Insufficient documentation

## 2019-05-29 LAB — CBC WITH DIFFERENTIAL/PLATELET
Abs Immature Granulocytes: 0.02 10*3/uL (ref 0.00–0.07)
Basophils Absolute: 0 10*3/uL (ref 0.0–0.1)
Basophils Relative: 0 %
Eosinophils Absolute: 0.2 10*3/uL (ref 0.0–0.5)
Eosinophils Relative: 3 %
HCT: 19.4 % — ABNORMAL LOW (ref 39.0–52.0)
Hemoglobin: 6.2 g/dL — ABNORMAL LOW (ref 13.0–17.0)
Immature Granulocytes: 0 %
Lymphocytes Relative: 16 %
Lymphs Abs: 0.8 10*3/uL (ref 0.7–4.0)
MCH: 32.1 pg (ref 26.0–34.0)
MCHC: 32 g/dL (ref 30.0–36.0)
MCV: 100.5 fL — ABNORMAL HIGH (ref 80.0–100.0)
Monocytes Absolute: 0.5 10*3/uL (ref 0.1–1.0)
Monocytes Relative: 10 %
Neutro Abs: 3.8 10*3/uL (ref 1.7–7.7)
Neutrophils Relative %: 71 %
Platelets: 191 10*3/uL (ref 150–400)
RBC: 1.93 MIL/uL — ABNORMAL LOW (ref 4.22–5.81)
RDW: 13.8 % (ref 11.5–15.5)
WBC: 5.4 10*3/uL (ref 4.0–10.5)
nRBC: 0 % (ref 0.0–0.2)

## 2019-05-29 LAB — BASIC METABOLIC PANEL
Anion gap: 11 (ref 5–15)
BUN: 24 mg/dL — ABNORMAL HIGH (ref 8–23)
CO2: 22 mmol/L (ref 22–32)
Calcium: 8 mg/dL — ABNORMAL LOW (ref 8.9–10.3)
Chloride: 101 mmol/L (ref 98–111)
Creatinine, Ser: 1.59 mg/dL — ABNORMAL HIGH (ref 0.61–1.24)
GFR calc Af Amer: 48 mL/min — ABNORMAL LOW (ref >60)
GFR calc non Af Amer: 41 mL/min — ABNORMAL LOW (ref >60)
Glucose, Bld: 122 mg/dL — ABNORMAL HIGH (ref 70–99)
Potassium: 4.3 mmol/L (ref 3.5–5.1)
Sodium: 134 mmol/L — ABNORMAL LOW (ref 135–145)

## 2019-05-29 LAB — PREPARE RBC (CROSSMATCH)

## 2019-05-29 LAB — ABO/RH: ABO/RH(D): A POS

## 2019-05-29 LAB — PROTIME-INR
INR: 1.1 (ref 0.8–1.2)
Prothrombin Time: 13.7 seconds (ref 11.4–15.2)

## 2019-05-29 LAB — TROPONIN I (HIGH SENSITIVITY)
Troponin I (High Sensitivity): 18 ng/L — ABNORMAL HIGH (ref <18)
Troponin I (High Sensitivity): 44 ng/L — ABNORMAL HIGH (ref <18)

## 2019-05-29 LAB — LACTIC ACID, PLASMA
Lactic Acid, Venous: 1.5 mmol/L (ref 0.5–1.9)
Lactic Acid, Venous: 1.3 mmol/L (ref 0.5–1.9)

## 2019-05-29 MED ORDER — OXYMETAZOLINE HCL 0.05 % NA SOLN
2.0000 | Freq: Once | NASAL | Status: AC
  Administered 2019-05-29: 2 via NASAL
  Filled 2019-05-29: qty 30

## 2019-05-29 MED ORDER — SODIUM CHLORIDE 0.9 % IV SOLN
10.0000 mL/h | Freq: Once | INTRAVENOUS | Status: AC
  Administered 2019-05-29: 10 mL/h via INTRAVENOUS

## 2019-05-29 MED ORDER — SODIUM CHLORIDE 0.9 % IV SOLN: 250.0000 mL | INTRAVENOUS | Status: DC | PRN

## 2019-05-29 MED ORDER — SODIUM CHLORIDE 0.9% FLUSH
3.0000 mL | Freq: Two times a day (BID) | INTRAVENOUS | Status: DC
  Administered 2019-05-29: 3 mL via INTRAVENOUS

## 2019-05-29 MED ORDER — ALUM & MAG HYDROXIDE-SIMETH 200-200-20 MG/5ML PO SUSP: 30.0000 mL | Freq: Four times a day (QID) | ORAL | Status: DC | PRN

## 2019-05-29 MED ORDER — SODIUM CHLORIDE 0.9 % IV SOLN: INTRAVENOUS | Status: DC

## 2019-05-29 MED ORDER — POLYETHYLENE GLYCOL 3350 17 G PO PACK: 17.0000 g | PACK | Freq: Every day | ORAL | Status: DC | PRN

## 2019-05-29 MED ORDER — ONDANSETRON HCL 4 MG/2ML IJ SOLN: 4.0000 mg | Freq: Four times a day (QID) | INTRAMUSCULAR | Status: DC | PRN

## 2019-05-29 MED ORDER — SODIUM CHLORIDE 0.9% FLUSH: 3.0000 mL | INTRAVENOUS | Status: DC | PRN

## 2019-05-29 MED ORDER — DULOXETINE HCL 30 MG PO CPEP
60.0000 mg | ORAL_CAPSULE | Freq: Two times a day (BID) | ORAL | Status: DC
  Administered 2019-05-29 – 2019-05-30 (×2): 60 mg via ORAL
  Filled 2019-05-29 (×2): qty 2

## 2019-05-29 MED ORDER — LEVOTHYROXINE SODIUM 50 MCG PO TABS
50.0000 ug | ORAL_TABLET | Freq: Every day | ORAL | Status: DC
  Administered 2019-05-30: 50 ug via ORAL
  Filled 2019-05-29: qty 1

## 2019-05-29 MED ORDER — LEVOTHYROXINE SODIUM 50 MCG PO TABS
75.0000 ug | ORAL_TABLET | Freq: Every day | ORAL | Status: DC
  Administered 2019-05-30: 75 ug via ORAL
  Filled 2019-05-29: qty 2

## 2019-05-29 MED ORDER — ACETAMINOPHEN 650 MG RE SUPP: 650.0000 mg | Freq: Four times a day (QID) | RECTAL | Status: DC | PRN

## 2019-05-29 MED ORDER — ALLOPURINOL 100 MG PO TABS
300.0000 mg | ORAL_TABLET | Freq: Every day | ORAL | Status: DC
  Administered 2019-05-29 – 2019-05-30 (×2): 300 mg via ORAL
  Filled 2019-05-29 (×2): qty 3

## 2019-05-29 MED ORDER — BISACODYL 5 MG PO TBEC: 5.0000 mg | DELAYED_RELEASE_TABLET | Freq: Every day | ORAL | Status: DC | PRN

## 2019-05-29 MED ORDER — ATORVASTATIN CALCIUM 20 MG PO TABS
20.0000 mg | ORAL_TABLET | Freq: Every day | ORAL | Status: DC
  Administered 2019-05-29: 20 mg via ORAL
  Filled 2019-05-29: qty 1

## 2019-05-29 MED ORDER — ONDANSETRON HCL 4 MG/2ML IJ SOLN
4.0000 mg | Freq: Once | INTRAMUSCULAR | Status: AC
  Administered 2019-05-29: 4 mg via INTRAVENOUS
  Filled 2019-05-29: qty 2

## 2019-05-29 MED ORDER — ACETAMINOPHEN 325 MG PO TABS
650.0000 mg | ORAL_TABLET | Freq: Four times a day (QID) | ORAL | Status: DC | PRN
  Administered 2019-05-30: 650 mg via ORAL
  Filled 2019-05-29: qty 2

## 2019-05-29 MED ORDER — ONDANSETRON HCL 4 MG PO TABS: 4.0000 mg | ORAL_TABLET | Freq: Four times a day (QID) | ORAL | Status: DC | PRN

## 2019-05-29 MED ORDER — SODIUM CHLORIDE 0.9 % IV BOLUS
500.0000 mL | Freq: Once | INTRAVENOUS | Status: AC
  Administered 2019-05-29: 500 mL via INTRAVENOUS

## 2019-05-29 MED ORDER — SODIUM CHLORIDE 0.9% FLUSH
3.0000 mL | Freq: Two times a day (BID) | INTRAVENOUS | Status: DC
  Administered 2019-05-29 – 2019-05-30 (×2): 3 mL via INTRAVENOUS

## 2019-05-29 MED ORDER — OXYMETAZOLINE HCL 0.05 % NA SOLN: 2.0000 | Freq: Four times a day (QID) | NASAL | Status: DC | PRN

## 2019-05-29 NOTE — H&P (Signed)
[facility] History and Physical   Patient: Dalton Roy TML:[id number]   PCP: [facility] DOB: 03/22/1941   DOA: 05/29/2019   DOS: 05/29/2019   DOS: the patient was seen and examined on 05/29/2019  Patient coming from: The patient is coming from Home  Chief Complaint: Nosebleed and near syncope   HPI: Dalton Roy is a 79 y.o. male with Past medical history of HTN, migraine, gout, hypothyroid, recurrent nosebleed.  Presented to ER as he noticed nosebleed from right nostril started in the afternoon when he was taking a nap.  Patient stated that he did blow his nose in the morning and after that he noticed a nosebleed.  Patient was recently admitted at [facility] with same complaint, aspirin was discontinued on discharge and patient was advised to follow-up with ENT which he has not got appointment yet but he was not having nasal bleeding since discharge on 05/23/2019.  ED Course: Soft blood pressure 101/60, hemoglobin 6.2, patient had Hb 9.7 on 05/23/2019, platelet count 191K normal.  CKD stage III creatinine 1.59 at baseline.  COVID-19 pending  ER physician noticed nasal bleeding from right nostril which was cauterized and oxymetazoline nasal spray was used.  Currently nasal bleeding has been stopped  2 unit of PRBC has been ordered for transfusion  Review of Systems: as mentioned in the history of present illness.  All other systems reviewed and are negative.  Past Medical History:  Diagnosis Date  . Bipolar 1 disorder ([place])   . Cancer ([place]) 20 years   skin  . Gout   . Heart murmur   . Hypertension   . Hypothyroidism   . ITP (idiopathic thrombocytopenic purpura)   . Migraines   . Psoriasis    Past Surgical History:  Procedure Laterality Date  . COLONOSCOPY N/A 08/31/2014   Procedure: COLONOSCOPY;  Surgeon: Robert Bellow, MD;  Location[facility] ENDOSCOPY;  Service: Endoscopy;  Laterality: N/A;  . COLONOSCOPY W/ POLYPECTOMY  09/12/2007   Tubular adenoma  from the proximal transverse colon without atypia.  Marland [place]  2001  . LITHOTRIPSY    . MELANOMA EXCISION  1998   Social History:  reports that he quit smoking about 53 years ago. His smoking use included cigarettes. He has quit using smokeless tobacco. He reports current alcohol use. He reports that he does not use drugs.  No Known Allergies   Family history reviewed and not pertinent Family History  Problem Relation Age of Onset  . Colon cancer Brother        Half brother  . Cancer Father        bladder cancer     Prior to Admission medications   Medication Sig Start Date End Date Taking? Authorizing Provider  allopurinol (ZYLOPRIM) 300 MG tablet TAKE 1 TABLET(300 MG) BY MOUTH DAILY Patient taking differently: Take 300 mg by mouth daily. TAKE 1 TABLET(300 MG) BY MOUTH DAILY 09/24/16  Yes Pleas Koch, NP  aspirin 325 MG tablet Take 325 mg by mouth daily.   Yes [provider]  atorvastatin (LIPITOR) 40 MG tablet Take 20 mg by mouth at bedtime.   Yes [provider]  DULoxetine (CYMBALTA) 60 MG capsule Take 60 mg by mouth 2 (two) times daily.   Yes [provider]  fluticasone (FLONASE) 50 MCG/ACT nasal spray Place 1 spray into both nostrils daily.   Yes [provider]  gabapentin (NEURONTIN) 400 MG capsule Take 400 mg by mouth 2 (  two) times daily.   Yes [provider]  levothyroxine (SYNTHROID) 50 MCG tablet Take 50 mcg by mouth daily before breakfast.   Yes [provider]  levothyroxine (SYNTHROID, LEVOTHROID) 75 MCG tablet Take 1 tablet (75 mcg total) by mouth daily before breakfast. 10/12/15  Yes Pleas Koch, NP  omeprazole (PRILOSEC) 20 MG capsule Take 20 mg by mouth as needed (acid reflux).    Yes [provider]  Salicylic Acid, Acne, (SALICYLIC ACID EX) Apply 1 application topically at bedtime.   Yes [provider]  SUMAtriptan (IMITREX) 100 MG tablet Take 50 mg by mouth every 2  (two) hours as needed for migraine (Pt takes 1 every night at bedtime). May repeat in 2 hours if headache persists or recurs.    Yes [provider]  verapamil (VERELAN PM) 120 MG 24 hr capsule Take 120 mg by mouth daily at 6 (six) AM.    Yes [provider]  ammonium lactate (LAC-HYDRIN) 12 % lotion Apply 1 application topically as needed for dry skin.    [provider]  fluocinonide ointment (LIDEX) 1.61 % Apply 1 application topically daily as needed (dry skin).    [provider]  hydrophilic ointment Apply 1 application topically as needed for dry skin.    [provider]  sildenafil (VIAGRA) 50 MG tablet Take 25 mg by mouth daily as needed for erectile dysfunction.    [provider]    Physical Exam: Vitals:   05/29/19 1505 05/29/19 1506  BP: 101/60   Pulse: 79   Resp: 17   Temp: 98.6 F (37 C)   TempSrc: Oral   SpO2: 100%   Weight:  95 kg  Height:  $Remove'5\' 7"'RNJiEGx$  (1.702 m)    General: alert and oriented to time, place, and person. Appear in mild distress, affect appropriate Eyes: PERRLA, Conjunctiva normal ENT: Oral Mucosa Clear, moist, right nostril punctate bleeding spots but no active bleeding seen Neck: no JVD, no Abnormal Mass Or lumps Cardiovascular: S1 and S2 Present,  Murmur audible, peripheral pulses symmetrical Respiratory: good respiratory effort, Bilateral Air entry equal and Decreased, signs of accessory muscle use, Clear to Auscultation, no Crackles, no wheezes Abdomen: Bowel Sound present, Soft and no tenderness, no hernia Skin: no rashes  Extremities: no Pedal edema, no calf tenderness Neurologic: without any new focal findings and mental status, alert and oriented x3 Gait not checked due to patient safety concerns  Data Reviewed: I have personally reviewed and interpreted labs, imaging as discussed below.  CBC: Recent Labs  Lab 05/23/19 0538 05/23/19 1315 05/29/19 1510  WBC 7.0  --  5.4  NEUTROABS  --    --  3.8  HGB 9.2* 9.7* 6.2*  HCT 27.3* 29.3* 19.4*  MCV 98.2  --  100.5*  PLT 167  --  096   Basic Metabolic Panel: Recent Labs  Lab 05/23/19 0538 05/23/19 1315 05/29/19 1510  NA 138  --  134*  K 4.7  --  4.3  CL 104  --  101  CO2 26  --  22  GLUCOSE 96  --  122*  BUN 27*  --  24*  CREATININE 1.65*  --  1.59*  CALCIUM 8.7*  --  8.0*  MG  --  2.2  --    GFR: Estimated Creatinine Clearance: 42.8 mL/min (A) (by C-G formula based on SCr of 1.59 mg/dL (H)). Liver Function Tests: No results for input(s): AST, ALT, ALKPHOS, BILITOT, PROT, ALBUMIN in  the last 168 hours. No results for input(s): LIPASE, AMYLASE in the last 168 hours. No results for input(s): AMMONIA in the last 168 hours. Coagulation Profile: Recent Labs  Lab 05/29/19 1510  INR 1.1   Cardiac Enzymes: No results for input(s): CKTOTAL, CKMB, CKMBINDEX, TROPONINI in the last 168 hours. BNP (last 3 results) No results for input(s): PROBNP in the last 8760 hours. HbA1C: No results for input(s): HGBA1C in the last 72 hours. CBG: Recent Labs  Lab 05/22/19 2101 05/23/19 0607  GLUCAP 132* 93   Lipid Profile: No results for input(s): CHOL, HDL, LDLCALC, TRIG, CHOLHDL, LDLDIRECT in the last 72 hours. Thyroid Function Tests: No results for input(s): TSH, T4TOTAL, FREET4, T3FREE, THYROIDAB in the last 72 hours. Anemia Panel: No results for input(s): VITAMINB12, FOLATE, FERRITIN, TIBC, IRON, RETICCTPCT in the last 72 hours. Urine analysis:    Component Value Date/Time   COLORURINE YELLOW 05/22/2019 0040   APPEARANCEUR HAZY (A) 05/22/2019 0040   LABSPEC 1.015 05/22/2019 0040   PHURINE 5.0 05/22/2019 0040   GLUCOSEU NEGATIVE 05/22/2019 0040   HGBUR NEGATIVE 05/22/2019 0040   BILIRUBINUR NEGATIVE 05/22/2019 0040   KETONESUR NEGATIVE 05/22/2019 0040   PROTEINUR NEGATIVE 05/22/2019 0040   NITRITE NEGATIVE 05/22/2019 0040   LEUKOCYTESUR NEGATIVE 05/22/2019 0040    Radiological Exams on Admission: No results  found.  EKG: No acute ischemic changes  05/22/19 Echocardiogram: EF 65% normal LV function.  Moderate AS/AR, distal aortic root 4.5 cm  05/22/19 NM cardiac stress test: Low risk study  I reviewed all nursing notes, pharmacy notes, vitals, pertinent old records.  Assessment/Plan  Principal problem Acute blood loss anemia secondary to epistaxis  Active Problems:   Essential hypertension   Hypothyroidism   Syncope   Acute blood loss anemia   Epistaxis, recurrent   # Near Syncope secondary to acute blood loss anemia Continue monitor on telemetry Check orthostatic BP Continue fall precautions, ambulate with assistance Patient had recent work-up done at [facility] as mentioned above, the patient does not need any further work-up for syncope Continue IV fluid for hydration  # Recurrent epistaxis, recent admission at [facility], discharged on 05/23/2019 Developed patient seen again today after blowing his nose Patient has not taken aspirin since discharge Right nostril bleeding point was cauterized and oxymetazoline was used by ER physician currently no epistaxis. Continue monitor and consult ENT if needed Patient did not require any nasal packing at this time, reassess and do the nasal packing if recurrent epistaxis Follow-up with ENT as an outpatient, and avoid nasal blowing  # Acute blood loss anemia secondary to epistaxis, Hb 6.2 on admission 2 units of PRBC ordered by ER physician Monitor H&H and transfuse as needed  # History of hypertension, patient is on verapamil which has been held secondary to soft blood pressure Continue IV fluids and monitor BP Resume verapamil when needed  # History of gout, continued allopurinol, no active issues  # Hypothyroid, continued Synthroid   Nutrition: Cardiac diet DVT Prophylaxis: SCD, pharmacological prophylaxis contraindicated due to Active bleeding  Advance goals of care discussion: Full code   Consults: None  Family  Communication: No one from family was present at bedside, at the time of interview.  Opportunity was given to ask question and all questions were answered satisfactorily.  Disposition: Admitted as observation med-surge unit. Likely to be discharged home, in 1-2 days.  I have discussed plan of care as described above with RN and patient/family.  Severity of Illness: The appropriate  patient status for this patient is OBSERVATION. Observation status is judged to be reasonable and necessary in order to provide the required intensity of service to ensure the patient's safety. The patient's presenting symptoms, physical exam findings, and initial radiographic and laboratory data in the context of their medical condition is felt to place them at decreased risk for further clinical deterioration. Furthermore, it is anticipated that the patient will be medically stable for discharge from the hospital within 2 midnights of admission. The following factors support the patient status of observation.   " The patient's presenting symptoms include syncope. " The physical exam findings include nasal bleeding. " The initial radiographic and laboratory data are low Hb 6.2.     Author: Val Riles, MD [facility] 05/29/2019 5:29 PM   To reach On-call, see care teams to locate the attending and reach out to them via [email or web address]. If 7PM-7AM, please contact night-coverage If you still have difficulty reaching the attending provider, please page the [facility] (Director on Call) for [facility] on amion for assistance.

## 2019-05-29 NOTE — ED Notes (Signed)
Pt received 524mL NS bolus from EMS PTA.

## 2019-05-29 NOTE — ED Provider Notes (Signed)
[facility] Emergency Department Provider Note  ____________________________________________   First MD Initiated Contact with Patient 05/29/19 1500     (approximate)  I have reviewed the triage vital signs and the nursing notes.   HISTORY  Chief Complaint Hypotension    HPI Dalton Roy is a 79 y.o. male with past medical history as below including recent admission for dehydration, AKI, and anemia with epistaxis, here with generalized weakness.  The patient reportedly was going to the [place] for his nosebleed today, which has worsened in the last 24 hours.  He reportedly sat up and had a witnessed syncopal episode.  He slumped over forward and lost consciousness.  He regained consciousness shortly thereafter upon lying down.  He does endorse continued lightheadedness with sitting up.  He states he feels like his nose continues to bleed on the back of his throat.  He feels generally weak.  Denies any recent trauma to the nose.  He does not believe he takes any blood thinners.  No other acute complaints.  No history of recurrent nosebleeds until his recent admission. No nausea or vomiting.       Past Medical History:  Diagnosis Date  . Bipolar 1 disorder ([facility])   . Cancer ([facility]) 20 years   skin  . Gout   . Heart murmur   . Hypertension   . Hypothyroidism   . ITP (idiopathic thrombocytopenic purpura)   . Migraines   . Psoriasis     Patient Active Problem List   Diagnosis Date Noted  . Syncope 05/23/2019  . Dyspnea 05/23/2019  . Near syncope 05/22/2019  . Medicare annual wellness visit, subsequent 08/24/2015  . Arthritis 05/10/2015  . Essential hypertension 01/27/2015  . Gout 01/27/2015  . Frequent headaches 01/27/2015  . Hypothyroidism 01/27/2015  . History of colonic polyps 08/30/2014    Past Surgical History:  Procedure Laterality Date  . COLONOSCOPY N/A 08/31/2014   Procedure: COLONOSCOPY;  Surgeon: Earline Mayotte, MD;  Location: [facility]  ENDOSCOPY;  Service: Endoscopy;  Laterality: N/A;  . COLONOSCOPY W/ POLYPECTOMY  09/12/2007   Tubular adenoma from the proximal transverse colon without atypia.  Marland Kitchen HEMORRHOID SURGERY  2001  . LITHOTRIPSY    . MELANOMA EXCISION  1998    Prior to Admission medications   Medication Sig Start Date End Date Taking? Authorizing Provider  allopurinol (ZYLOPRIM) 300 MG tablet TAKE 1 TABLET(300 MG) BY MOUTH DAILY Patient taking differently: Take 300 mg by mouth daily. TAKE 1 TABLET(300 MG) BY MOUTH DAILY 09/24/16  Yes Doreene Nest, NP  aspirin 325 MG tablet Take 325 mg by mouth daily.   Yes [provider]  atorvastatin (LIPITOR) 40 MG tablet Take 20 mg by mouth at bedtime.   Yes [provider]  DULoxetine (CYMBALTA) 60 MG capsule Take 60 mg by mouth 2 (two) times daily.   Yes [provider]  fluticasone (FLONASE) 50 MCG/ACT nasal spray Place 1 spray into both nostrils daily.   Yes [provider]  gabapentin (NEURONTIN) 400 MG capsule Take 400 mg by mouth 2 (two) times daily.   Yes [provider]  levothyroxine (SYNTHROID) 50 MCG tablet Take 50 mcg by mouth daily before breakfast.   Yes [provider]  levothyroxine (SYNTHROID, LEVOTHROID) 75 MCG tablet Take 1 tablet (75 mcg total) by mouth daily before breakfast. 10/12/15  Yes Doreene Nest, NP  omeprazole (PRILOSEC) 20 MG capsule Take 20 mg by mouth as needed (acid reflux).  Yes [provider]  Salicylic Acid, Acne, (SALICYLIC ACID EX) Apply 1 application topically at bedtime.   Yes [provider]  SUMAtriptan (IMITREX) 100 MG tablet Take 50 mg by mouth every 2 (two) hours as needed for migraine (Pt takes 1 every night at bedtime). May repeat in 2 hours if headache persists or recurs.    Yes [provider]  verapamil (VERELAN PM) 120 MG 24 hr capsule Take 120 mg by mouth daily at 6 (six) AM.    Yes [provider]  ammonium lactate (LAC-HYDRIN)  12 % lotion Apply 1 application topically as needed for dry skin.    [provider]  fluocinonide ointment (LIDEX) 6.55 % Apply 1 application topically daily as needed (dry skin).    [provider]  hydrophilic ointment Apply 1 application topically as needed for dry skin.    [provider]  sildenafil (VIAGRA) 50 MG tablet Take 25 mg by mouth daily as needed for erectile dysfunction.    [provider]    Allergies Patient has no known allergies.  Family History  Problem Relation Age of Onset  . Colon cancer Brother        Half brother  . Cancer Father        bladder cancer    Social History Social History   Tobacco Use  . Smoking status: Former Smoker    Types: Cigarettes    Quit date: 04/09/1966    Years since quitting: 53.1  . Smokeless tobacco: Former Network engineer Use Topics  . Alcohol use: Yes    Alcohol/week: 0.0 standard drinks  . Drug use: No    Review of Systems  Review of Systems  Constitutional: Positive for fatigue. Negative for chills and fever.  HENT: Positive for nosebleeds. Negative for sore throat.   Respiratory: Negative for shortness of breath.   Cardiovascular: Negative for chest pain.  Gastrointestinal: Negative for abdominal pain.  Genitourinary: Negative for flank pain.  Musculoskeletal: Negative for neck pain.  Skin: Negative for rash and wound.  Allergic/Immunologic: Negative for immunocompromised state.  Neurological: Positive for syncope, weakness and light-headedness. Negative for numbness.  Hematological: Does not bruise/bleed easily.  All other systems reviewed and are negative.    ____________________________________________  PHYSICAL EXAM:      VITAL SIGNS: ED Triage Vitals  Enc Vitals Group     BP 05/29/19 1505 101/60     Pulse Rate 05/29/19 1505 79     Resp 05/29/19 1505 17     Temp 05/29/19 1505 98.6 F (37 C)     Temp Source 05/29/19 1505 Oral     SpO2 05/29/19 1505 100 %      Weight 05/29/19 1506 209 lb 7 oz (95 kg)     Height 05/29/19 1506 $RemoveBefor'5\' 7"'IokmHONRoOPM$  (1.702 m)     Head Circumference --      Peak Flow --      Pain Score 05/29/19 1505 0     Pain Loc --      Pain Edu? --      Excl. in [place]? --      Physical Exam Vitals and nursing note reviewed.  Constitutional:      General: He is not in acute distress.    Appearance: He is ill-appearing.  HENT:     Head: Normocephalic and atraumatic.     Comments: Nasal and mucosal pallor.  Dried blood in the right nares, with clot obstructing view of posterior pharynx.  No active bleeding noted in the oropharynx. Eyes:     Conjunctiva/sclera: Conjunctivae normal.  Cardiovascular:     Rate and Rhythm: Normal rate and regular rhythm.     Heart sounds: Normal heart sounds. No murmur. No friction rub.  Pulmonary:     Effort: Pulmonary effort is normal. No respiratory distress.     Breath sounds: Normal breath sounds. No wheezing or rales.  Abdominal:     General: There is no distension.     Palpations: Abdomen is soft.     Tenderness: There is no abdominal tenderness.  Musculoskeletal:     Cervical back: Neck supple.  Skin:    General: Skin is warm.     Capillary Refill: Capillary refill takes less than 2 seconds.     Coloration: Skin is pale.  Neurological:     Mental Status: He is alert and oriented to person, place, and time.     Motor: No abnormal muscle tone.       ____________________________________________   LABS (all labs ordered are listed, but only abnormal results are displayed)  Labs Reviewed  CBC WITH DIFFERENTIAL/PLATELET - Abnormal; Notable for the following components:      Result Value   RBC 1.93 (*)    Hemoglobin 6.2 (*)    HCT 19.4 (*)    MCV 100.5 (*)    All other components within normal limits  BASIC METABOLIC PANEL - Abnormal; Notable for the following components:   Sodium 134 (*)    Glucose, Bld 122 (*)    BUN 24 (*)    Creatinine, Ser 1.59 (*)    Calcium 8.0 (*)    GFR calc  non Af Amer 41 (*)    GFR calc Af Amer 48 (*)    All other components within normal limits  SARS CORONAVIRUS 2 (TAT 6-24 HRS)  LACTIC ACID, PLASMA  PROTIME-INR  LACTIC ACID, PLASMA  TYPE AND SCREEN  PREPARE RBC (CROSSMATCH)  TROPONIN I (HIGH SENSITIVITY)    ____________________________________________  EKG: Normal sinus rhythm with first-degree AV block, ventricular rate 70.  PR 216, QRS 90, QTc 435.  No acute ST elevations or depressions. ________________________________________  RADIOLOGY All imaging, including plain films, CT scans, and ultrasounds, independently reviewed by me, and interpretations confirmed via formal radiology reads.  ED MD interpretation:   None  Official radiology report(s): No results found.  ____________________________________________  PROCEDURES   Procedure(s) performed (including Critical Care):  .Critical Care Performed by: Duffy Bruce, MD Authorized by: Duffy Bruce, MD   Critical care provider statement:    Critical care time (minutes):  35   Critical care time was exclusive of:  Separately billable procedures and treating other patients and teaching time   Critical care was necessary to treat or prevent imminent or life-threatening deterioration of the following conditions:  Cardiac failure, circulatory failure and respiratory failure   Critical care was time spent personally by me on the following activities:  Development of treatment plan with patient or surrogate, discussions with consultants, evaluation of patient's response to treatment, examination of patient, obtaining history from patient or surrogate, ordering and performing treatments and interventions, ordering and review of laboratory studies, ordering and review of radiographic studies, pulse oximetry, re-evaluation of patient's condition and review of old charts   I assumed direction of critical care for this patient from another provider in my specialty: no   .Epistaxis  Management  Date/Time: 05/29/2019 5:01 PM Performed by: Duffy Bruce, MD Authorized by: Duffy Bruce, MD  Consent:    Consent obtained:  Verbal   Consent given by:  Patient   Risks discussed:  Bleeding, infection, nasal injury and pain   Alternatives discussed:  Alternative treatment Anesthesia (see MAR for exact dosages):    Anesthesia method:  Topical application   Topical anesthesia: Cetacaine. Procedure details:    Treatment site:  R anterior   Treatment method:  Silver nitrate   Treatment complexity:  Limited   Treatment episode: recurring   Post-procedure details:    Assessment:  Bleeding stopped   Patient tolerance of procedure:  Tolerated well, no immediate complications    ____________________________________________  INITIAL IMPRESSION / MDM / ASSESSMENT AND PLAN / ED COURSE  As part of my medical decision making, I reviewed the following data within the [place] notes reviewed and incorporated, Old chart reviewed, Notes from prior ED visits, and [place] was evaluated in Emergency Department on 05/29/2019 for the symptoms described in the history of present illness. He was evaluated in the context of the global COVID-19 pandemic, which necessitated consideration that the patient might be at risk for infection with the SARS-CoV-2 virus that causes COVID-19. Institutional protocols and algorithms that pertain to the evaluation of patients at risk for COVID-19 are in a state of rapid change based on information released by regulatory bodies including the CDC and federal and state organizations. These policies and algorithms were followed during the patient's care in the ED.  Some ED evaluations and interventions may be delayed as a result of limited staffing during the pandemic.*     Medical Decision Making:  79 yo M here with syncopal episode in the setting of recent admission for epistaxis  and AKI.  On arrival here, the patient is markedly orthostatic and near syncopal with sitting upright.  His lab work shows hemoglobin of 6.2, down from 9 during recent admission.  He does have a moderate right anterior epistaxis on exam.  This was controlled as above.  Will plan for transfusion given his syncopal episode with symptomatic anemia.  Creatinine appears to be near baseline.  No other acute complaints.  EKG nonischemic.  Do not suspect ACS or arrhythmia.  Admit to medicine.  ____________________________________________  FINAL CLINICAL IMPRESSION(S) / ED DIAGNOSES  Final diagnoses:  Epistaxis  Blood loss anemia     MEDICATIONS GIVEN DURING THIS VISIT:  Medications  0.9 %  sodium chloride infusion (has no administration in time range)  oxymetazoline (AFRIN) 0.05 % nasal spray 2 spray (2 sprays Each Nare Given 05/29/19 1621)  sodium chloride 0.9 % bolus 500 mL (0 mLs Intravenous Stopped 05/29/19 1700)  ondansetron (ZOFRAN) injection 4 mg (4 mg Intravenous Given 05/29/19 1606)     ED Discharge Orders    None       Note:  This document was prepared using Dragon voice recognition software and may include unintentional dictation errors.   Duffy Bruce, MD 05/29/19 [phone]

## 2019-05-29 NOTE — ED Triage Notes (Signed)
Pt presents to ED via [facility] from home c/o hypotension. EMS report pt has had problems over the last week with nosebleeds, was seen in ED last week, had 3 episodes today. Pt had an appt at the [facility] today for cauterization, was sitting at home getting ready and felt faint, became pale and diaphoretic and fell out of chair. Arrives with C-collar in place, EMS report SBP lying down 90 palpated, unable to obtain BP sitting up.

## 2019-05-30 DIAGNOSIS — R040 Epistaxis: Secondary | ICD-10-CM

## 2019-05-30 DIAGNOSIS — R55 Syncope and collapse: Secondary | ICD-10-CM | POA: Diagnosis not present

## 2019-05-30 DIAGNOSIS — D62 Acute posthemorrhagic anemia: Secondary | ICD-10-CM

## 2019-05-30 LAB — TYPE AND SCREEN
ABO/RH(D): A POS
Antibody Screen: NEGATIVE
Unit division: 0
Unit division: 0

## 2019-05-30 LAB — BPAM RBC
Blood Product Expiration Date: 202103132359
Blood Product Expiration Date: 202103132359
ISSUE DATE / TIME: 202102191756
ISSUE DATE / TIME: 202102192319
Unit Type and Rh: 6200
Unit Type and Rh: 6200

## 2019-05-30 LAB — BASIC METABOLIC PANEL
Anion gap: 7 (ref 5–15)
BUN: 25 mg/dL — ABNORMAL HIGH (ref 8–23)
CO2: 23 mmol/L (ref 22–32)
Calcium: 7.9 mg/dL — ABNORMAL LOW (ref 8.9–10.3)
Chloride: 107 mmol/L (ref 98–111)
Creatinine, Ser: 1.45 mg/dL — ABNORMAL HIGH (ref 0.61–1.24)
GFR calc Af Amer: 53 mL/min — ABNORMAL LOW (ref >60)
GFR calc non Af Amer: 46 mL/min — ABNORMAL LOW (ref >60)
Glucose, Bld: 98 mg/dL (ref 70–99)
Potassium: 4.6 mmol/L (ref 3.5–5.1)
Sodium: 137 mmol/L (ref 135–145)

## 2019-05-30 LAB — CBC
HCT: 26 % — ABNORMAL LOW (ref 39.0–52.0)
Hemoglobin: 8.8 g/dL — ABNORMAL LOW (ref 13.0–17.0)
MCH: 31.4 pg (ref 26.0–34.0)
MCHC: 33.8 g/dL (ref 30.0–36.0)
MCV: 92.9 fL (ref 80.0–100.0)
Platelets: 196 10*3/uL (ref 150–400)
RBC: 2.8 MIL/uL — ABNORMAL LOW (ref 4.22–5.81)
RDW: 14.9 % (ref 11.5–15.5)
WBC: 6.5 10*3/uL (ref 4.0–10.5)
nRBC: 0 % (ref 0.0–0.2)

## 2019-05-30 LAB — SARS CORONAVIRUS 2 (TAT 6-24 HRS): SARS Coronavirus 2: NEGATIVE

## 2019-05-30 LAB — GLUCOSE, CAPILLARY: Glucose-Capillary: 95 mg/dL (ref 70–99)

## 2019-05-30 MED ORDER — GABAPENTIN 400 MG PO CAPS: 400.0000 mg | ORAL_CAPSULE | Freq: Two times a day (BID) | ORAL | Status: DC

## 2019-05-30 MED ORDER — PANTOPRAZOLE SODIUM 40 MG PO TBEC: 40.0000 mg | DELAYED_RELEASE_TABLET | Freq: Every day | ORAL | Status: DC

## 2019-05-30 MED ORDER — VERAPAMIL HCL ER 120 MG PO TBCR
120.0000 mg | EXTENDED_RELEASE_TABLET | Freq: Every day | ORAL | Status: DC
  Filled 2019-05-30: qty 1

## 2019-05-30 NOTE — Plan of Care (Signed)
IV's removed. No falls. BP has been stable. Education has been completed. Discharge paper work has been provided. Teach back method utilized for d/c.  Problem: Education: Goal: Knowledge of General Education information will improve Description: Including pain rating scale, medication(s)/side effects and non-pharmacologic comfort measures Outcome: Completed/Met   Problem: Health Behavior/Discharge Planning: Goal: Ability to manage health-related needs will improve Outcome: Completed/Met   Problem: Clinical Measurements: Goal: Ability to maintain clinical measurements within normal limits will improve Outcome: Completed/Met Goal: Will remain free from infection Outcome: Completed/Met Goal: Diagnostic test results will improve Outcome: Completed/Met Goal: Respiratory complications will improve Outcome: Completed/Met Goal: Cardiovascular complication will be avoided Outcome: Completed/Met   Problem: Activity: Goal: Risk for activity intolerance will decrease Outcome: Completed/Met   Problem: Nutrition: Goal: Adequate nutrition will be maintained Outcome: Completed/Met   Problem: Coping: Goal: Level of anxiety will decrease Outcome: Completed/Met   Problem: Elimination: Goal: Will not experience complications related to bowel motility Outcome: Completed/Met Goal: Will not experience complications related to urinary retention Outcome: Completed/Met   Problem: Pain Managment: Goal: General experience of comfort will improve Outcome: Completed/Met   Problem: Safety: Goal: Ability to remain free from injury will improve Outcome: Completed/Met   Problem: Skin Integrity: Goal: Risk for impaired skin integrity will decrease Outcome: Completed/Met

## 2019-05-30 NOTE — Progress Notes (Signed)
Called NP, Ouma regarding patient's troponin level of 44. Will continue to trend and check H&H post transfusion.  Will continue to monitor.  Christene Slates  05/30/2019  12:45 AM

## 2019-05-30 NOTE — Evaluation (Signed)
Physical Therapy Evaluation Patient Details Name: ARVON SCHREINER MRN: [id number] DOB: 1940-08-11 Today's Date: 05/30/2019   History of Present Illness  Patient is a 79 year old male admitted with acute blood loss anemia. PMH includes HTN, gout, migrains.  Clinical Impression  Patient received in bed, wants to go home today. Agrees to PT assessment. Patient requires no physical assist with bed mobility or transfers. Supervision only. Patient ambulated around nursing station, no AD, no LOB. Normal gait speed. Patient appears to be at his baseline level of function and will be able to return home at discharge.      Follow Up Recommendations No PT follow up    Equipment Recommendations  None recommended by PT    Recommendations for Other Services       Precautions / Restrictions Precautions Precautions: Fall Restrictions Weight Bearing Restrictions: No      Mobility  Bed Mobility Overal bed mobility: Modified Independent             General bed mobility comments: use of bed rail  Transfers Overall transfer level: Needs assistance   Transfers: Sit to/from Stand Sit to Stand: Supervision         General transfer comment: cues to take his time to ensure safety, no BP drop  Ambulation/Gait Ambulation/Gait assistance: Min guard Gait Distance (Feet): 150 Feet Assistive device: None Gait Pattern/deviations: WFL(Within Functional Limits);Wide base of support Gait velocity: WNL   General Gait Details: wide BOS, no lob or difficulties noted  Stairs            Wheelchair Mobility    Modified Rankin (Stroke Patients Only)       Balance Overall balance assessment: Mild deficits observed, not formally tested                                           Pertinent Vitals/Pain Pain Assessment: No/denies pain    Home Living Family/patient expects to be discharged to:: Private residence Living Arrangements: Spouse/significant other   Type of  Home: House Home Access: Level entry     Home Layout: Two level;Able to live on main level with bedroom/bathroom        Prior Function Level of Independence: Independent               Hand Dominance        Extremity/Trunk Assessment   Upper Extremity Assessment Upper Extremity Assessment: Defer to OT evaluation    Lower Extremity Assessment Lower Extremity Assessment: Overall WFL for tasks assessed    Cervical / Trunk Assessment Cervical / Trunk Assessment: Normal  Communication   Communication: No difficulties  Cognition Arousal/Alertness: Awake/alert Behavior During Therapy: WFL for tasks assessed/performed Overall Cognitive Status: Within Functional Limits for tasks assessed                                        General Comments      Exercises     Assessment/Plan    PT Assessment Patent does not need any further PT services  PT Problem List         PT Treatment Interventions      PT Goals (Current goals can be found in the Care Plan section)  Acute Rehab PT Goals Patient Stated Goal: to return home today PT  Goal Formulation: With patient Time For Goal Achievement: 06/02/19 Potential to Achieve Goals: Good    Frequency     Barriers to discharge        Co-evaluation               AM-PAC PT "6 Clicks" Mobility  Outcome Measure Help needed turning from your back to your side while in a flat bed without using bedrails?: None Help needed moving from lying on your back to sitting on the side of a flat bed without using bedrails?: None Help needed moving to and from a bed to a chair (including a wheelchair)?: None Help needed standing up from a chair using your arms (e.g., wheelchair or bedside chair)?: None Help needed to walk in hospital room?: None Help needed climbing 3-5 steps with a railing? : None 6 Click Score: 24    End of Session Equipment Utilized During Treatment: Gait belt Activity Tolerance: Patient  tolerated treatment well Patient left: in bed;with call bell/phone within reach Nurse Communication: Mobility status      Time: 0950-1005 PT Time Calculation (min) (ACUTE ONLY): 15 min   Charges:   PT Evaluation $PT Eval Moderate Complexity: 1 Mod          Hydia Copelin, PT, GCS 05/30/19,11:28 AM

## 2019-05-30 NOTE — Evaluation (Signed)
Occupational Therapy Evaluation Patient Details Name: Dalton Roy MRN: [id number] DOB: 1941-02-07 Today's Date: 05/30/2019    History of Present Illness Patient is a 79 year old male admitted with acute blood loss anemia. PMH includes HTN, gout, migraines.   Clinical Impression   Pt is 79 year old male who presents with acute blood loss from nose bleeds.  Pt is anxious to get back home and is at baseline for ADLs at this time with no needs for any further OT services.  He has hx of decreased sensation with numbness in BUEs and LEs due to peripheral neuropathy but has functional use of BUEs and LEs for standing and ambulating and completing all self care but needs assist from his wife for meal preparation. Pt seen for OT evaluation only.    Follow Up Recommendations  No OT follow up    Equipment Recommendations       Recommendations for Other Services       Precautions / Restrictions Precautions Precautions: Fall Restrictions Weight Bearing Restrictions: No      Mobility Bed Mobility Overal bed mobility: Modified Independent             General bed mobility comments: use of bed rail  Transfers Overall transfer level: Needs assistance   Transfers: Sit to/from Stand Sit to Stand: Supervision         General transfer comment: cues to take his time to ensure safety, no BP drop    Balance Overall balance assessment: Mild deficits observed, not formally tested                                         ADL either performed or assessed with clinical judgement   ADL Overall ADL's : At baseline                                       General ADL Comments: Pt is at baseline for ADLs and did not need any cues or assist when standing or ambulating.  Able to safely reach his feet for LB Dressing as well. His wife assists with cooking since he has some numbness in BUEs due to peripheral neuropathy.     Vision Patient Visual Report: No  change from baseline       Perception     Praxis      Pertinent Vitals/Pain Pain Assessment: No/denies pain     Hand Dominance Right   Extremity/Trunk Assessment Upper Extremity Assessment Upper Extremity Assessment: Overall WFL for tasks assessed   Lower Extremity Assessment Lower Extremity Assessment: Defer to PT evaluation   Cervical / Trunk Assessment Cervical / Trunk Assessment: Normal   Communication Communication Communication: No difficulties   Cognition Arousal/Alertness: Awake/alert Behavior During Therapy: WFL for tasks assessed/performed;Anxious Overall Cognitive Status: Within Functional Limits for tasks assessed                                     General Comments       Exercises     Shoulder Instructions      Home Living Family/patient expects to be discharged to:: Private residence Living Arrangements: Spouse/significant other Available Help at Discharge: Family Type of Home: House Home Access: Level entry  Home Layout: Two level;Able to live on main level with bedroom/bathroom               Home Equipment: Shower seat   Additional Comments: Pt reports he has a shower chair and took the back off of it.      Prior Functioning/Environment Level of Independence: Independent                 OT Problem List: Decreased activity tolerance      OT Treatment/Interventions:      OT Goals(Current goals can be found in the care plan section) Acute Rehab OT Goals Patient Stated Goal: to return home today OT Goal Formulation: With patient Time For Goal Achievement: 05/30/19 Potential to Achieve Goals: Good  OT Frequency:     Barriers to D/C:            Co-evaluation              AM-PAC OT "6 Clicks" Daily Activity     Outcome Measure Help from another person eating meals?: None Help from another person taking care of personal grooming?: None Help from another person toileting, which includes using  toliet, bedpan, or urinal?: None Help from another person bathing (including washing, rinsing, drying)?: None Help from another person to put on and taking off regular upper body clothing?: None Help from another person to put on and taking off regular lower body clothing?: None 6 Click Score: 24   End of Session    Activity Tolerance: Patient tolerated treatment well Patient left: in bed;with call bell/phone within reach;with bed alarm set  OT Visit Diagnosis: Muscle weakness (generalized) (M62.81)                Time: 2426-8341 OT Time Calculation (min): 25 min Charges:  OT General Charges $OT Visit: 1 Visit OT Evaluation $OT Eval Low Complexity: 1 Low OT Treatments $Self Care/Home Management : 8-22 mins  Chrys Racer, OTR/L, [place] [facility] [phone] 05/30/19, 12:14 PM

## 2019-05-30 NOTE — Discharge Summary (Signed)
Physician Discharge Summary  Dalton Roy DQQ:[id number] DOB: 04/16/1940 DOA: 05/29/2019  PCP: [facility]  Admit date: 05/29/2019 Discharge date: 05/30/2019  Recommendations for Outpatient Follow-up:  1. Recurrent epistaxis, acute blood loss anemia  Follow-up [place]. Schedule an appointment as soon as possible for a visit in 1 week(s).   Specialty: General Practice Contact information: [place] [id number] [phone]            Discharge Diagnoses: Principal diagnosis is #1 1. Syncope/near syncope secondary to acute blood loss anemia secondary to epistaxis 2. Recurrent epistaxis 3. Acute blood loss anemia secondary to epistaxis 4. Elevated troponin high-sensitivity 5. CKD stage III 6. Gout  Discharge Condition: improved Disposition: home  Diet recommendation: heart healthy  Filed Weights   05/29/19 1506 05/30/19 0500  Weight: 95 kg 97.4 kg    History of present illness:  79 year old man recently discharged 2/13 after being hospitalized for epistaxis and presyncope.  Work-up was unrevealing and he was discharged with plans for outpatient follow-up with ENT.  He in fact had follow-up 2/19 scheduled, however he developed recurrent epistaxis and had a syncopal episode and was sent to [facility].  In the emergency department was orthostatic with near syncope, hemoglobin 6.2, moderate right anterior epistaxis noted.  Was treated with silver nitrate in the emergency department.  Chart review --Discharged 2/13, presented with shortness of breath, near syncope, with history of 2 weeks of epistaxis intermittently.  Was treated for dyspnea on exertion, D-dimer and BMP were normal.  Echocardiogram normal LV systolic and diastolic parameters.  Moderate aortic stenosis and insufficiency.  High-sensitivity troponins ranged from 58-86.  Cardiology recommended carotid Dopplers and nuclear medicine study which was read as normal, low  risk.  Carotid Dopplers showed only mild stenosis.  Recommended for outpatient follow-up with cardiology with repeat echocardiogram in 6 months.  Epistaxis resolved.  Patient was referred to ENT as an outpatient.  Hospital Course:  Patient had no recurrent bleeding overnight, hemoglobin improved appropriately with transfusion 2 units PRBC.  Symptoms resolved.  Hospitalization uncomplicated.  Individual issues as below.  Syncope/near syncope secondary to acute blood loss anemia secondary to epistaxis --No evaluation for syncope indicated, had previous negative evaluation last hospitalization.  Recurrent epistaxis, treated in the emergency department with silver nitrate with reported hemostasis. --No recurrent bleeding.  Hemoglobin stable.  Follow-up with ENT as an outpatient, avoid blowing nose  Acute blood loss anemia secondary to epistaxis --Hemoglobin improved appropriately status post 2 units PRBC  Elevated troponin high-sensitivity.  Unclear why this was checked in this patient with no signs or symptoms to suggest ACS and recent negative cardiac testing. --High-sensitivity levels are consistent with previous on last admission and are not indicative of ACS or any acute cardiac issue.  Not clear why this was ordered during this hospitalization.  No further evaluation or testing is indicated.  CKD stage III --Stable.  Gout --Continue allopurinol  Significant Hospital Events    2/19 admitted for acute blood loss anemia, syncope/near syncope  Consults:     Procedures:   2/18 silver nitrate application to right anterior nare in the emergency department  Significant Diagnostic Tests:   SARS-CoV-2 negative  Admission hemoglobin 6.2 down from 9.7 days prior.  Today's assessment: S: Feels fine, no complaints today.  No recurrent bleeding.  Has been ambulating without difficulty.  No shortness of breath, pain or lightheadedness. O: Vitals:  Vitals:   05/30/19 0422  05/30/19 6314  BP: 112/79 137/82  Pulse: 74 90  Resp:  20  Temp: 98.5 F (36.9 C) 97.8 F (36.6 C)  SpO2: 100% 100%    Constitutional:  . Appears calm and comfortable ENT.  External nares, nose appear unremarkable. Respiratory:  . CTA bilaterally, no w/r/r.  . Respiratory effort normal.  Cardiovascular:  . RRR, no m/r/g . Telemetry sinus rhythm Psychiatric:  . judgement and insight appear normal . Mental status o Mood, affect appropriate  Today's Data   Hemoglobin up to 8.8 status post 2 units PRBC, this is appropriate.  EKG on admission sinus rhythm with first-degree AV block, no significant change observed from prior study.  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   Discharge Instructions  Discharge Instructions    Diet - low sodium heart healthy   Complete by: As directed    Discharge instructions   Complete by: As directed    Call your physician or seek immediate medical attention for bleeding, chest pain, shortness of breath, dizziness, passing out or worsening of condition.   Increase activity slowly   Complete by: As directed      Allergies as of 05/30/2019   No Known Allergies     Medication List    STOP taking these medications   aspirin 325 MG tablet     TAKE these medications   allopurinol 300 MG tablet Commonly known as: ZYLOPRIM TAKE 1 TABLET(300 MG) BY MOUTH DAILY What changed: See the new instructions.   ammonium lactate 12 % lotion Commonly known as: LAC-HYDRIN Apply 1 application topically as needed for dry skin.   atorvastatin 40 MG tablet Commonly known as: LIPITOR Take 20 mg by mouth at bedtime.   DULoxetine 60 MG capsule Commonly known as: CYMBALTA Take 60 mg by mouth 2 (two) times daily.   fluocinonide ointment 0.05 % Commonly known as: LIDEX Apply 1 application topically daily as needed (dry skin).   fluticasone 50 MCG/ACT nasal spray Commonly known as: FLONASE Place 1 spray into both nostrils daily.   gabapentin 400 MG  capsule Commonly known as: NEURONTIN Take 400 mg by mouth 2 (two) times daily.   hydrophilic ointment Apply 1 application topically as needed for dry skin.   levothyroxine 50 MCG tablet Commonly known as: SYNTHROID Take 50 mcg by mouth daily before breakfast.   levothyroxine 75 MCG tablet Commonly known as: SYNTHROID Take 1 tablet (75 mcg total) by mouth daily before breakfast.   omeprazole 20 MG capsule Commonly known as: PRILOSEC Take 20 mg by mouth as needed (acid reflux).   SALICYLIC ACID EX Apply 1 application topically at bedtime.   sildenafil 50 MG tablet Commonly known as: VIAGRA Take 25 mg by mouth daily as needed for erectile dysfunction.   SUMAtriptan 100 MG tablet Commonly known as: IMITREX Take 50 mg by mouth every 2 (two) hours as needed for migraine (Pt takes 1 every night at bedtime). May repeat in 2 hours if headache persists or recurs.   verapamil 120 MG 24 hr capsule Commonly known as: VERELAN PM Take 120 mg by mouth daily at 6 (six) AM.      No Known Allergies  The results of significant diagnostics from this hospitalization (including imaging, microbiology, ancillary and laboratory) are listed below for reference.    Significant Diagnostic Studies: NM Myocar Multi W/Spect W/Wall Motion / EF  Result Date: 05/23/2019  No T wave inversion was noted during stress.  There was no ST segment deviation noted during stress.  The study is  normal.  This is a low risk study.  The left ventricular ejection fraction is normal (55-65%).  Normal resting and stress perfusion. No ischemia or infarction EF 56%   DG Chest Port 1 View  Result Date: 05/22/2019 CLINICAL DATA:  Shortness of breath EXAM: PORTABLE CHEST 1 VIEW COMPARISON:  None. FINDINGS: Normal heart size and mild aortic tortuosity. There is no edema, consolidation, effusion, or pneumothorax. Calcified granuloma appearance over the right mid chest. IMPRESSION: No evidence of active disease.  Electronically Signed   By: Monte Fantasia M.D.   On: 05/22/2019 06:01   ECHOCARDIOGRAM COMPLETE  Result Date: 05/22/2019    ECHOCARDIOGRAM REPORT   Patient Name:   Dalton Roy Date of Exam: 05/22/2019 Medical Rec #:  [id number]      Height:       67.0 in Accession #:    [id number]     Weight:       208.2 lb Date of Birth:  March 11, 1941      BSA:          2.06 m Patient Age:    82 years       BP:           138/80 mmHg Patient Gender: M              HR:           76 bpm. 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 Chronic kidney disease; Risk                 Factors:Hypertension.  Sonographer:    Johny Chess Referring Phys: [place]  1. Left ventricular ejection fraction, by estimation, is 60 to 65%. The left ventricle has normal function. The left ventricle has no regional wall motion abnormalities. Left ventricular diastolic parameters were normal.  2. Right ventricular systolic function is normal. The right ventricular size is normal. There is mildly elevated pulmonary artery systolic pressure.  3. The mitral valve is degenerative. Mild mitral valve regurgitation. No evidence of mitral stenosis.  4. The aortic valve is tricuspid. Aortic valve regurgitation is moderate. Moderate aortic valve stenosis.  5. Distal aortic root measured off axis at 4.5 cm . aortic dilatation noted. 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 FINDINGS  Left Ventricle: Left ventricular ejection fraction, by estimation, is 60 to 65%. The left ventricle has normal function. The left ventricle has no regional wall motion abnormalities. The left ventricular internal cavity size was normal in size. There is  no left ventricular hypertrophy. Left ventricular diastolic parameters were normal. Right Ventricle: The  right ventricular size is normal. No increase in right ventricular wall thickness. Right ventricular systolic function is normal. There is mildly elevated pulmonary artery systolic pressure. The tricuspid regurgitant velocity is 2.82  m/s, and with an assumed right atrial pressure of 3 mmHg, the estimated right ventricular systolic pressure is 79.3 mmHg. Left Atrium: Left atrial size was normal in size. Right Atrium: Right atrial size was normal in size. Pericardium: There is no evidence of pericardial effusion. Mitral Valve: The mitral valve is degenerative in appearance. There is mild thickening of the mitral valve leaflet(s). There is mild calcification of the mitral valve  leaflet(s). Normal mobility of the mitral valve leaflets. Mild mitral valve regurgitation. No evidence of mitral valve stenosis. Tricuspid Valve: The tricuspid valve is normal in structure. Tricuspid valve regurgitation is mild . No evidence of tricuspid stenosis. Aortic Valve: The aortic valve is tricuspid. . There is moderate thickening and moderate calcification of the aortic valve. Aortic valve regurgitation is moderate. Moderate aortic stenosis is present. There is moderate thickening of the aortic valve. There is moderate calcification of the aortic valve. Aortic valve mean gradient measures 25.0 mmHg. Aortic valve peak gradient measures 44.6 mmHg. Aortic valve area, by VTI measures 1.28 cm. Pulmonic Valve: The pulmonic valve was normal in structure. Pulmonic valve regurgitation is trivial. No evidence of pulmonic stenosis. Aorta: Distal aortic root measured off axis at 4.5 cm. The aortic root is normal in size and structure and aortic dilatation noted. 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 IAS/Shunts: No atrial level shunt detected by color flow Doppler.  LEFT VENTRICLE PLAX 2D LVIDd:         4.90 cm  Diastology LVIDs:         3.20 cm  LV e' lateral:   8.49 cm/s LV PW:         1.10 cm  LV E/e' lateral:  7.6 LV IVS:        1.10 cm  LV e' medial:    5.55 cm/s LVOT diam:     1.80 cm  LV E/e' medial:  11.7 LV SV:         80.03 ml LV SV Index:   33.49 LVOT Area:     2.54 cm  RIGHT VENTRICLE RV S prime:     14.70 cm/s TAPSE (M-mode): 2.3 cm LEFT ATRIUM             Index       RIGHT ATRIUM           Index LA diam:        3.60 cm 1.75 cm/m  RA Area:     13.00 cm LA Vol (A2C):   69.7 ml 33.88 ml/m RA Volume:   24.80 ml  12.06 ml/m LA Vol (A4C):   54.0 ml 26.25 ml/m LA Biplane Vol: 62.7 ml 30.48 ml/m  AORTIC VALVE AV Area (Vmax):    1.24 cm AV Area (Vmean):   1.18 cm AV Area (VTI):     1.28 cm AV Vmax:           334.00 cm/s AV Vmean:          235.000 cm/s AV VTI:            0.624 m AV Peak Grad:      44.6 mmHg AV Mean Grad:      25.0 mmHg LVOT Vmax:         162.50 cm/s LVOT Vmean:        109.000 cm/s LVOT VTI:          0.314 m LVOT/AV VTI ratio: 0.50  AORTA Ao Root diam: 3.60 cm MITRAL VALVE               TRICUSPID VALVE MV Area (PHT): 3.31 cm    TR Peak grad:   31.8 mmHg MV Decel Time: 229 msec    TR Vmax:        282.00 cm/s MV E velocity: 64.90 cm/s MV A velocity: 86.90 cm/s  SHUNTS MV E/A ratio:  0.75  Systemic VTI:  0.31 m                            Systemic Diam: 1.80 cm Jenkins Rouge MD Electronically signed by Jenkins Rouge MD Signature Date/Time: 05/22/2019/4:41:01 PM    Final    VAS US CAROTID  Result Date: 05/22/2019 Carotid Arterial Duplex Study Indications:  Bilateral bruits. Risk Factors: Hypertension. Performing Technologist: Maudry Mayhew MHA, RDMS, RVT, RDCS  Examination Guidelines: A complete evaluation includes B-mode imaging, spectral Doppler, color Doppler, and power Doppler as needed of all accessible portions of each vessel. Bilateral testing is considered an integral part of a complete examination. Limited examinations for reoccurring indications may be performed as noted.  Right Carotid Findings: +----------+--------+--------+--------+------------------+--------+           PSV  cm/sEDV cm/sStenosisPlaque DescriptionComments +----------+--------+--------+--------+------------------+--------+ CCA Prox  63      13                                         +----------+--------+--------+--------+------------------+--------+ CCA Distal58      15                                         +----------+--------+--------+--------+------------------+--------+ ICA Prox  68      21                                         +----------+--------+--------+--------+------------------+--------+ ICA Distal97      34                                         +----------+--------+--------+--------+------------------+--------+ ECA       50      10                                         +----------+--------+--------+--------+------------------+--------+ +----------+--------+-------+----------------+-------------------+           PSV cm/sEDV cmsDescribe        Arm Pressure (mmHG) +----------+--------+-------+----------------+-------------------+ [id number]             Multiphasic, WNL                    +----------+--------+-------+----------------+-------------------+ +---------+--------+--+--------+--+---------+ VertebralPSV cm/s66EDV cm/s26Antegrade +---------+--------+--+--------+--+---------+  Left Carotid Findings: +----------+--------+-------+--------+--------------------------------+--------+           PSV cm/sEDV    StenosisPlaque Description              Comments                   cm/s                                                    +----------+--------+-------+--------+--------------------------------+--------+ CCA Prox  83      28                                                      +----------+--------+-------+--------+--------------------------------+--------+  CCA Distal89      28                                                      +----------+--------+-------+--------+--------------------------------+--------+ ICA  Prox  71      17             smooth, heterogenous and                                                  calcific                                 +----------+--------+-------+--------+--------------------------------+--------+ ICA Distal115     46             smooth and heterogenous                  +----------+--------+-------+--------+--------------------------------+--------+ ECA       103     23                                                      +----------+--------+-------+--------+--------------------------------+--------+ +----------+--------+--------+--------------+-------------------+           PSV cm/sEDV cm/sDescribe      Arm Pressure (mmHG) +----------+--------+--------+--------------+-------------------+ Subclavian                Not identified                    +----------+--------+--------+--------------+-------------------+ +---------+--------+--+--------+--+---------+ VertebralPSV cm/s57EDV cm/s15Antegrade +---------+--------+--+--------+--+---------+   Summary: Right Carotid: Velocities in the right ICA are consistent with a 1-39% stenosis. Left Carotid: Velocities in the left ICA are consistent with a 1-39% stenosis. Vertebrals:  Bilateral vertebral arteries demonstrate antegrade flow. Subclavians: Left subclavian artery was not visualized. Normal flow hemodynamics              were seen in the right subclavian artery. *See table(s) above for measurements and observations.  Electronically signed by Monica Martinez MD on 05/22/2019 at 6:16:07 PM.    Final     Microbiology: Recent Results (from the past 240 hour(s))  Respiratory Panel by RT PCR (Flu A&B, Covid) - Nasopharyngeal Swab     Status: None   Collection Time: 05/22/19 12:36 AM   Specimen: Nasopharyngeal Swab  Result Value Ref Range Status   SARS Coronavirus 2 by RT PCR NEGATIVE NEGATIVE Final    Comment: (NOTE) SARS-CoV-2 target nucleic acids are NOT DETECTED. The SARS-CoV-2 RNA is  generally detectable in upper respiratoy specimens during the acute phase of infection. The lowest concentration of SARS-CoV-2 viral copies this assay can detect is 131 copies/mL. A negative result does not preclude SARS-Cov-2 infection and should not be used as the sole basis for treatment or other patient management decisions. A negative result may occur with  improper specimen collection/handling, submission of specimen other than nasopharyngeal swab, presence of viral mutation(s) within the areas targeted by this assay, and inadequate number of viral copies (<131 copies/mL). A negative result must  be combined with clinical observations, patient history, and epidemiological information. The expected result is Negative. Fact Sheet for Patients:  https://www.moore.com/ Fact Sheet for Healthcare Providers:  https://www.young.biz/ This test is not yet ap proved or cleared by the [place] FDA and  has been authorized for detection and/or diagnosis of SARS-CoV-2 by FDA under an Emergency Use Authorization (EUA). This EUA will remain  in effect (meaning this test can be used) for the duration of the COVID-19 declaration under Section 564(b)(1) of the Act, 21 U.S.C. section 360bbb-3(b)(1), unless the authorization is terminated or revoked sooner.    Influenza A by PCR NEGATIVE NEGATIVE Final   Influenza B by PCR NEGATIVE NEGATIVE Final    Comment: (NOTE) The Xpert Xpress SARS-CoV-2/FLU/RSV assay is intended as an aid in  the diagnosis of influenza from Nasopharyngeal swab specimens and  should not be used as a sole basis for treatment. Nasal washings and  aspirates are unacceptable for Xpert Xpress SARS-CoV-2/FLU/RSV  testing. Fact Sheet for Patients: https://www.moore.com/ Fact Sheet for Healthcare Providers: https://www.young.biz/ This test is not yet approved or cleared by the [place] FDA and    has been authorized for detection and/or diagnosis of SARS-CoV-2 by  FDA under an Emergency Use Authorization (EUA). This EUA will remain  in effect (meaning this test can be used) for the duration of the  Covid-19 declaration under Section 564(b)(1) of the Act, 21  U.S.C. section 360bbb-3(b)(1), unless the authorization is  terminated or revoked. Performed at [facility], [address], [place], [place] [address]   SARS CORONAVIRUS 2 (TAT 6-24 HRS) Nasopharyngeal Nasopharyngeal Swab     Status: None   Collection Time: 05/29/19  4:32 PM   Specimen: Nasopharyngeal Swab  Result Value Ref Range Status   SARS Coronavirus 2 NEGATIVE NEGATIVE Final    Comment: (NOTE) SARS-CoV-2 target nucleic acids are NOT DETECTED. The SARS-CoV-2 RNA is generally detectable in upper and lower respiratory specimens during the acute phase of infection. Negative results do not preclude SARS-CoV-2 infection, do not rule out co-infections with other pathogens, and should not be used as the sole basis for treatment or other patient management decisions. Negative results must be combined with clinical observations, patient history, and epidemiological information. The expected result is Negative. Fact Sheet for Patients: HairSlick.no Fact Sheet for Healthcare Providers: quierodirigir.com This test is not yet approved or cleared by the [place] FDA and  has been authorized for detection and/or diagnosis of SARS-CoV-2 by FDA under an Emergency Use Authorization (EUA). This EUA will remain  in effect (meaning this test can be used) for the duration of the COVID-19 declaration under Section 56 4(b)(1) of the Act, 21 U.S.C. section 360bbb-3(b)(1), unless the authorization is terminated or revoked sooner. Performed at [facility], [address], [place], [place] [address]      Labs: Basic Metabolic Panel: Recent Labs  Lab  05/23/19 1315 05/29/19 1510 05/30/19 0518  NA  --  134* 137  K  --  4.3 4.6  CL  --  101 107  CO2  --  22 23  GLUCOSE  --  122* 98  BUN  --  24* 25*  CREATININE  --  1.59* 1.45*  CALCIUM  --  8.0* 7.9*  MG 2.2  --   --    CBC: Recent Labs  Lab 05/23/19 1315 05/29/19 1510 05/30/19 0518  WBC  --  5.4 6.5  NEUTROABS  --  3.8  --  HGB 9.7* 6.2* 8.8*  HCT 29.3* 19.4* 26.0*  MCV  --  100.5* 92.9  PLT  --  191 196    Recent Labs    05/22/19 0003  BNP 60.1    CBG: Recent Labs  Lab 05/30/19 0502  GLUCAP 95    Active Problems:   Essential hypertension   Hypothyroidism   Syncope   Acute blood loss anemia   Epistaxis, recurrent   Time coordinating discharge: 35 minutes  Signed:  Murray Hodgkins, MD  [facility]  05/30/2019, 11:21 AM

## 2019-06-05 ENCOUNTER — Emergency Department
Admission: EM | Admit: 2019-06-05 | Discharge: 2019-06-05 | Disposition: A | Discharge status: Home / Self Care | Payer: No Typology Code available for payment source | Attending: Emergency Medicine | Admitting: Emergency Medicine

## 2019-06-05 ENCOUNTER — Other Ambulatory Visit: Payer: Self-pay

## 2019-06-05 DIAGNOSIS — Z79899 Other long term (current) drug therapy: Secondary | ICD-10-CM | POA: Insufficient documentation

## 2019-06-05 DIAGNOSIS — I10 Essential (primary) hypertension: Secondary | ICD-10-CM | POA: Insufficient documentation

## 2019-06-05 DIAGNOSIS — Z87891 Personal history of nicotine dependence: Secondary | ICD-10-CM | POA: Insufficient documentation

## 2019-06-05 DIAGNOSIS — R040 Epistaxis: Secondary | ICD-10-CM | POA: Insufficient documentation

## 2019-06-05 DIAGNOSIS — E039 Hypothyroidism, unspecified: Secondary | ICD-10-CM | POA: Diagnosis not present

## 2019-06-05 LAB — CBC
HCT: 30.9 % — ABNORMAL LOW (ref 39.0–52.0)
Hemoglobin: 9.8 g/dL — ABNORMAL LOW (ref 13.0–17.0)
MCH: 30.2 pg (ref 26.0–34.0)
MCHC: 31.7 g/dL (ref 30.0–36.0)
MCV: 95.1 fL (ref 80.0–100.0)
Platelets: 318 10*3/uL (ref 150–400)
RBC: 3.25 MIL/uL — ABNORMAL LOW (ref 4.22–5.81)
RDW: 13.8 % (ref 11.5–15.5)
WBC: 4.4 10*3/uL (ref 4.0–10.5)
nRBC: 0 % (ref 0.0–0.2)

## 2019-06-05 LAB — BASIC METABOLIC PANEL
Anion gap: 9 (ref 5–15)
BUN: 18 mg/dL (ref 8–23)
CO2: 23 mmol/L (ref 22–32)
Calcium: 8.7 mg/dL — ABNORMAL LOW (ref 8.9–10.3)
Chloride: 104 mmol/L (ref 98–111)
Creatinine, Ser: 1.47 mg/dL — ABNORMAL HIGH (ref 0.61–1.24)
GFR calc Af Amer: 53 mL/min — ABNORMAL LOW (ref >60)
GFR calc non Af Amer: 45 mL/min — ABNORMAL LOW (ref >60)
Glucose, Bld: 97 mg/dL (ref 70–99)
Potassium: 4.3 mmol/L (ref 3.5–5.1)
Sodium: 136 mmol/L (ref 135–145)

## 2019-06-05 MED ORDER — OXYMETAZOLINE HCL 0.05 % NA SOLN
1.0000 | Freq: Once | NASAL | Status: AC
  Administered 2019-06-05: 1 via NASAL

## 2019-06-05 MED ORDER — SILVER NITRATE-POT NITRATE 75-25 % EX MISC
1.0000 | Freq: Once | CUTANEOUS | Status: AC
  Administered 2019-06-05: 19:00:00 1 via TOPICAL

## 2019-06-05 NOTE — ED Notes (Signed)
Pt given ice water.

## 2019-06-05 NOTE — ED Notes (Signed)
Pt reports tolerating water ok

## 2019-06-05 NOTE — ED Notes (Addendum)
Pt to ED for nose bleed that started at noon. States he has chronic nose bleeds x3 weeks, and has lost consciousness from it before.  Denies blood thinner use Bleeding controlled at this time

## 2019-06-05 NOTE — ED Triage Notes (Addendum)
Pt comes via POV from home with c/o nose bleed. Pt states this started this afternoon.  Pt denies any blood thinners. Pt denies any other symptoms.  Pt states this has been happening a lot here recently. Pt has clamp in place and bleeding controlled. Pt states he recently had his nasal calderized.

## 2019-06-05 NOTE — Discharge Instructions (Signed)
If you have to blow your nose, please open your mouth.   If you have nosebleed, please apply afrin three times a day as needed and hold pressure for 20 min. If bleeding doesn't stop, please come back to the ED  See your doctor and ENT   Return to ER if you have uncontrolled bleeding, passing out.

## 2019-06-05 NOTE — ED Notes (Signed)
Full rainbow sent to lab.  

## 2019-06-05 NOTE — ED Provider Notes (Signed)
[facility] EMERGENCY DEPARTMENT Provider Note   CSN: [id number] Arrival date & time: 06/05/19  1347     History Chief Complaint  Patient presents with  . Epistaxis    Dalton Roy is a 79 y.o. male hx of ITP, recent admission for nosebleed with syncope here presenting with epistaxis.  Patient was seen here about a week ago.  Was noted to have a nosebleed from the right nostril.  He was anemic at the time and required transfusion and overnight observation.  Patient states that he was doing well and around noon today, he noticed a gush of blood on the right nostril. He states that he tried to put some pressure but his bleeding has not stopped. Denies passing out this time.  The history is provided by the patient.       Past Medical History:  Diagnosis Date  . Bipolar 1 disorder (HCC)   . Cancer (HCC) 20 years   skin  . Gout   . Heart murmur   . Hypertension   . Hypothyroidism   . ITP (idiopathic thrombocytopenic purpura)   . Migraines   . Psoriasis     Patient Active Problem List   Diagnosis Date Noted  . Acute blood loss anemia 05/29/2019  . Epistaxis, recurrent 05/29/2019  . Syncope 05/23/2019  . Dyspnea 05/23/2019  . Near syncope 05/22/2019  . Medicare annual wellness visit, subsequent 08/24/2015  . Arthritis 05/10/2015  . Essential hypertension 01/27/2015  . Gout 01/27/2015  . Frequent headaches 01/27/2015  . Hypothyroidism 01/27/2015  . History of colonic polyps 08/30/2014    Past Surgical History:  Procedure Laterality Date  . COLONOSCOPY N/A 08/31/2014   Procedure: COLONOSCOPY;  Surgeon: Earline Mayotte, MD;  Location: [facility] ENDOSCOPY;  Service: Endoscopy;  Laterality: N/A;  . COLONOSCOPY W/ POLYPECTOMY  09/12/2007   Tubular adenoma from the proximal transverse colon without atypia.  Marland Kitchen HEMORRHOID SURGERY  2001  . LITHOTRIPSY    . MELANOMA EXCISION  1998       Family History  Problem Relation Age of Onset  . Colon cancer  Brother        Half brother  . Cancer Father        bladder cancer    Social History   Tobacco Use  . Smoking status: Former Smoker    Types: Cigarettes    Quit date: 04/09/1966    Years since quitting: 53.1  . Smokeless tobacco: Former Engineer, water Use Topics  . Alcohol use: Yes    Alcohol/week: 0.0 standard drinks  . Drug use: No    Home Medications Prior to Admission medications   Medication Sig Start Date End Date Taking? Authorizing Provider  allopurinol (ZYLOPRIM) 300 MG tablet TAKE 1 TABLET(300 MG) BY MOUTH DAILY Patient taking differently: Take 300 mg by mouth daily. TAKE 1 TABLET(300 MG) BY MOUTH DAILY 09/24/16   Doreene Nest, NP  ammonium lactate (LAC-HYDRIN) 12 % lotion Apply 1 application topically as needed for dry skin.    [provider]  atorvastatin (LIPITOR) 40 MG tablet Take 20 mg by mouth at bedtime.    [provider]  DULoxetine (CYMBALTA) 60 MG capsule Take 60 mg by mouth 2 (two) times daily.    [provider]  fluocinonide ointment (LIDEX) 0.05 % Apply 1 application topically daily as needed (dry skin).    [provider]  fluticasone (FLONASE) 50 MCG/ACT nasal spray Place 1 spray into both nostrils daily.  [provider]  gabapentin (NEURONTIN) 400 MG capsule Take 400 mg by mouth 2 (two) times daily.    [provider]  hydrophilic ointment Apply 1 application topically as needed for dry skin.    [provider]  levothyroxine (SYNTHROID) 50 MCG tablet Take 50 mcg by mouth daily before breakfast.    [provider]  levothyroxine (SYNTHROID, LEVOTHROID) 75 MCG tablet Take 1 tablet (75 mcg total) by mouth daily before breakfast. 10/12/15   Pleas Koch, NP  omeprazole (PRILOSEC) 20 MG capsule Take 20 mg by mouth as needed (acid reflux).     [provider]  Salicylic Acid, Acne, (SALICYLIC ACID EX) Apply 1 application topically at bedtime.    [provider]  sildenafil (VIAGRA) 50 MG tablet Take 25 mg by mouth daily as needed for erectile dysfunction.    [provider]  SUMAtriptan (IMITREX) 100 MG tablet Take 50 mg by mouth every 2 (two) hours as needed for migraine (Pt takes 1 every night at bedtime). May repeat in 2 hours if headache persists or recurs.     [provider]  verapamil (VERELAN PM) 120 MG 24 hr capsule Take 120 mg by mouth daily at 6 (six) AM.     [provider]    Allergies    Patient has no known allergies.  Review of Systems   Review of Systems  HENT: Positive for nosebleeds.   All other systems reviewed and are negative.   Physical Exam Updated Vital Signs BP (!) 143/74   Pulse 91   Temp 97.8 F (36.6 C)   Resp 18   Ht $R'5\' 7"'Ue$  (1.702 m)   Wt 97.4 kg   SpO2 100%   BMI 33.63 kg/m   Physical Exam Vitals and nursing note reviewed.  Constitutional:      Appearance: Normal appearance.  HENT:     Head: Normocephalic.     Right Ear: Tympanic membrane normal.     Left Ear: Tympanic membrane normal.     Nose:     Comments: Dry blood R nostril.  There is some active bleeding in the Kiesselbach triangle as well as the right nasal septum.  No active bleeding from the left nostril.  No bleeding in the posterior pharynx.    Mouth/Throat:     Mouth: Mucous membranes are moist.  Eyes:     Extraocular Movements: Extraocular movements intact.     Pupils: Pupils are equal, round, and reactive to light.  Cardiovascular:     Rate and Rhythm: Normal rate.     Pulses: Normal pulses.     Heart sounds: Normal heart sounds.  Pulmonary:     Effort: Pulmonary effort is normal.  Abdominal:     General: Abdomen is flat.     Palpations: Abdomen is soft.  Musculoskeletal:        General: Normal range of motion.     Cervical back: Normal range of motion.  Skin:    General: Skin is warm.     Capillary Refill: Capillary refill takes less than 2 seconds.  Neurological:     General: No focal  deficit present.     Mental Status: He is alert.  Psychiatric:        Mood and Affect: Mood normal.        Behavior: Behavior normal.     ED Results / Procedures / Treatments   Labs (all labs ordered are listed, but only abnormal results are  displayed) Labs Reviewed  CBC - Abnormal; Notable for the following components:      Result Value   RBC 3.25 (*)    Hemoglobin 9.8 (*)    HCT 30.9 (*)    All other components within normal limits  BASIC METABOLIC PANEL - Abnormal; Notable for the following components:   Creatinine, Ser 1.47 (*)    Calcium 8.7 (*)    GFR calc non Af Amer 45 (*)    GFR calc Af Amer 53 (*)    All other components within normal limits    EKG None  Radiology No results found.  Procedures .Epistaxis Management  Date/Time: 06/05/2019 6:22 PM Performed by: Drenda Freeze, MD Authorized by: Drenda Freeze, MD   Consent:    Consent obtained:  Verbal   Consent given by:  Patient   Risks discussed:  Bleeding   Alternatives discussed:  No treatment Anesthesia (see MAR for exact dosages):    Anesthesia method:  None Procedure details:    Treatment site:  R anterior   Treatment method:  Silver nitrate   Treatment complexity:  Limited   Treatment episode: initial   Post-procedure details:    Assessment:  Bleeding stopped   Patient tolerance of procedure:  Tolerated well, no immediate complications   (including critical care time)    Medications Ordered in ED Medications  oxymetazoline (AFRIN) 0.05 % nasal spray 1 spray (1 spray Each Nare Given 06/05/19 1741)    ED Course  I have reviewed the triage vital signs and the nursing notes.  Pertinent labs & imaging results that were available during my care of the patient were reviewed by me and considered in my medical decision making (see chart for details).    MDM Rules/Calculators/A&P                      KEYMANI MCLEAN is a 79 y.o. male here presenting with nosebleed.  This is a  recurrent problem and he had a cauterization last week.  He has some bleeding from the right Kiesselbach triangle as well as symptoms.  I was able to cauterize it again and will get repeat CBC and BMP.  7:01 PM Hemoglobin is 9.8 which is improved from previous. His creatinine is baseline.  Applied silver nitrate and bleeding has stopped. Patient tolerated p.o. in the ED.  Has ENT referral already.  Stable for discharge.  Final Clinical Impression(s) / ED Diagnoses Final diagnoses:  None    Rx / DC Orders ED Discharge Orders    None       Drenda Freeze, MD 06/05/19 Lurline Hare

## 2019-10-13 ENCOUNTER — Encounter: Payer: Self-pay | Admitting: Podiatry

## 2019-10-13 ENCOUNTER — Other Ambulatory Visit: Payer: Self-pay

## 2019-10-13 ENCOUNTER — Other Ambulatory Visit: Payer: Self-pay | Admitting: Podiatry

## 2019-10-13 ENCOUNTER — Ambulatory Visit (INDEPENDENT_AMBULATORY_CARE_PROVIDER_SITE_OTHER): Payer: No Typology Code available for payment source

## 2019-10-13 ENCOUNTER — Ambulatory Visit: Payer: No Typology Code available for payment source

## 2019-10-13 ENCOUNTER — Ambulatory Visit (INDEPENDENT_AMBULATORY_CARE_PROVIDER_SITE_OTHER): Payer: Medicare Other | Admitting: Podiatry

## 2019-10-13 DIAGNOSIS — M79672 Pain in left foot: Secondary | ICD-10-CM | POA: Diagnosis not present

## 2019-10-13 DIAGNOSIS — M7662 Achilles tendinitis, left leg: Secondary | ICD-10-CM | POA: Diagnosis not present

## 2019-10-13 NOTE — Progress Notes (Signed)
Subjective:  Patient ID: Dalton Roy, male    DOB: 04-03-1941,  MRN: [id number]  Chief Complaint  Patient presents with  . Foot Pain    pt is here for left heel pain    79 y.o. male presents with the above complaint.  Patient presents with acute complaint of left posterior heel pain that has been going on for quite some time.  Patient states that he has been on for 3 to 4 weeks has progressive gotten worse.  Patient is a veteran patient states is painful to walk on.  Patient has not tried any kind of treatment.  Patient would like to discuss management to take care of this pain.  He denies any other acute complaints.  His pain scale 7 out of 10.  Is dull achy in nature.  He has not seen anyone else prior to seeing me.   Review of Systems: Negative except as noted in the HPI. Denies N/V/F/Ch.  Past Medical History:  Diagnosis Date  . Bipolar 1 disorder ([place])   . Cancer ([place]) 20 years   skin  . Gout   . Heart murmur   . Hypertension   . Hypothyroidism   . ITP (idiopathic thrombocytopenic purpura)   . Migraines   . Psoriasis     Current Outpatient Medications:  .  allopurinol (ZYLOPRIM) 300 MG tablet, TAKE 1 TABLET(300 MG) BY MOUTH DAILY (Patient taking differently: Take 300 mg by mouth daily. TAKE 1 TABLET(300 MG) BY MOUTH DAILY), Disp: 90 tablet, Rfl: 1 .  ammonium lactate (LAC-HYDRIN) 12 % lotion, Apply 1 application topically as needed for dry skin., Disp: , Rfl:  .  atorvastatin (LIPITOR) 40 MG tablet, Take 20 mg by mouth at bedtime., Disp: , Rfl:  .  DULoxetine (CYMBALTA) 60 MG capsule, Take 60 mg by mouth 2 (two) times daily., Disp: , Rfl:  .  fluocinonide ointment (LIDEX) 0.97 %, Apply 1 application topically daily as needed (dry skin)., Disp: , Rfl:  .  fluticasone (FLONASE) 50 MCG/ACT nasal spray, Place 1 spray into both nostrils daily., Disp: , Rfl:  .  gabapentin (NEURONTIN) 400 MG capsule, Take 400 mg by mouth 2 (two) times daily., Disp: , Rfl:  .  hydrophilic  ointment, Apply 1 application topically as needed for dry skin., Disp: , Rfl:  .  levothyroxine (SYNTHROID) 50 MCG tablet, Take 50 mcg by mouth daily before breakfast., Disp: , Rfl:  .  levothyroxine (SYNTHROID, LEVOTHROID) 75 MCG tablet, Take 1 tablet (75 mcg total) by mouth daily before breakfast., Disp: 90 tablet, Rfl: 3 .  omeprazole (PRILOSEC) 20 MG capsule, Take 20 mg by mouth as needed (acid reflux). , Disp: , Rfl:  .  Salicylic Acid, Acne, (SALICYLIC ACID EX), Apply 1 application topically at bedtime., Disp: , Rfl:  .  sildenafil (VIAGRA) 50 MG tablet, Take 25 mg by mouth daily as needed for erectile dysfunction., Disp: , Rfl:  .  SUMAtriptan (IMITREX) 100 MG tablet, Take 50 mg by mouth every 2 (two) hours as needed for migraine (Pt takes 1 every night at bedtime). May repeat in 2 hours if headache persists or recurs. , Disp: , Rfl:  .  verapamil (VERELAN PM) 120 MG 24 hr capsule, Take 120 mg by mouth daily at 6 (six) AM. , Disp: , Rfl:   Social History   Tobacco Use  Smoking Status Former Smoker  . Types: Cigarettes  . Quit date: 04/09/1966  . Years since quitting: 53.5  Smokeless Tobacco Former  User    No Known Allergies Objective:  There were no vitals filed for this visit. There is no height or weight on file to calculate BMI. Constitutional Well developed. Well nourished.  Vascular Dorsalis pedis pulses palpable bilaterally. Posterior tibial pulses palpable bilaterally. Capillary refill normal to all digits.  No cyanosis or clubbing noted. Pedal hair growth normal.  Neurologic Normal speech. Oriented to person, place, and time. Epicritic sensation to light touch grossly present bilaterally.  Dermatologic Nails well groomed and normal in appearance. No open wounds. No skin lesions.  Orthopedic:  Pain on palpation at the insertion of the left Achilles tendon.  Pain with dorsiflexion of the ankle joint.  Pain with resisted plantarflexion of the ankle joint.  No pain with  plantarflexion of the ankle joint active and passive.  No pain at the posterior tibial tendon, peroneal tendon, ATFL.   Radiographs: 3 views of skeletally mature adult left ankle: Posterior spurring noted.  No plantar heel spur noted.  No other bony abnormalities identified. Assessment:   1. Foot pain, left   2. Left Achilles tendinitis    Plan:  Patient was evaluated and treated and all questions answered.  Left Achilles tendinitis -I explained to the patient the etiology of tendinitis and various treatment options were discussed.  Given that patient has mild to moderate pain I will plan on putting him in a brace as opposed to cam boot.  Patient is also reluctant to wearing a cam boot as well.  Also believe will benefit from a steroid injection help decrease the acute inflammatory component associated with pain.  I explained to the patient that there is a high risk of rupture associated with it given that there is steroid near the tendon.  Patient would like to proceed with injection despite the risks. -A steroid injection was performed at left Kager's fat pad using 1% plain Lidocaine and 10 mg of Kenalog. This was well tolerated. -If there is no improvement will have plan on putting him in a cam boot for complete immobilization.  No follow-ups on file.

## 2019-10-16 ENCOUNTER — Telehealth: Payer: Self-pay | Admitting: *Deleted

## 2019-10-16 NOTE — Telephone Encounter (Signed)
"  I'm calling to see if Dalton Roy was seen on 10/13/2019."  Yes, he was seen.  Can you send [place] his clinical notes?"  I'll send them over.  What's your fax number?  "The fax number is [phone]."    I faxed the clinicals to the [place] as requested.

## 2019-11-19 ENCOUNTER — Ambulatory Visit: Payer: No Typology Code available for payment source | Admitting: Podiatry

## 2020-02-13 ENCOUNTER — Emergency Department: Payer: No Typology Code available for payment source

## 2020-02-13 ENCOUNTER — Inpatient Hospital Stay
Admission: EM | Admit: 2020-02-13 | Discharge: 2020-02-16 | DRG: 186 | Disposition: A | Discharge status: Home Health | Payer: No Typology Code available for payment source | Source: Skilled Nursing Facility | Attending: Internal Medicine | Admitting: Internal Medicine

## 2020-02-13 ENCOUNTER — Encounter: Payer: Self-pay | Admitting: Radiology

## 2020-02-13 ENCOUNTER — Other Ambulatory Visit: Payer: Self-pay

## 2020-02-13 DIAGNOSIS — R778 Other specified abnormalities of plasma proteins: Secondary | ICD-10-CM | POA: Diagnosis present

## 2020-02-13 DIAGNOSIS — R0902 Hypoxemia: Secondary | ICD-10-CM | POA: Diagnosis present

## 2020-02-13 DIAGNOSIS — Z951 Presence of aortocoronary bypass graft: Secondary | ICD-10-CM | POA: Diagnosis not present

## 2020-02-13 DIAGNOSIS — D693 Immune thrombocytopenic purpura: Secondary | ICD-10-CM | POA: Diagnosis present

## 2020-02-13 DIAGNOSIS — D649 Anemia, unspecified: Secondary | ICD-10-CM | POA: Diagnosis present

## 2020-02-13 DIAGNOSIS — I350 Nonrheumatic aortic (valve) stenosis: Secondary | ICD-10-CM | POA: Diagnosis not present

## 2020-02-13 DIAGNOSIS — Z79899 Other long term (current) drug therapy: Secondary | ICD-10-CM | POA: Diagnosis not present

## 2020-02-13 DIAGNOSIS — I313 Pericardial effusion (noninflammatory): Secondary | ICD-10-CM | POA: Diagnosis present

## 2020-02-13 DIAGNOSIS — N2581 Secondary hyperparathyroidism of renal origin: Secondary | ICD-10-CM | POA: Diagnosis present

## 2020-02-13 DIAGNOSIS — E039 Hypothyroidism, unspecified: Secondary | ICD-10-CM | POA: Diagnosis present

## 2020-02-13 DIAGNOSIS — J9621 Acute and chronic respiratory failure with hypoxia: Secondary | ICD-10-CM

## 2020-02-13 DIAGNOSIS — I361 Nonrheumatic tricuspid (valve) insufficiency: Secondary | ICD-10-CM | POA: Diagnosis not present

## 2020-02-13 DIAGNOSIS — I359 Nonrheumatic aortic valve disorder, unspecified: Secondary | ICD-10-CM | POA: Diagnosis not present

## 2020-02-13 DIAGNOSIS — D631 Anemia in chronic kidney disease: Secondary | ICD-10-CM | POA: Diagnosis present

## 2020-02-13 DIAGNOSIS — J9811 Atelectasis: Secondary | ICD-10-CM | POA: Diagnosis present

## 2020-02-13 DIAGNOSIS — Z85820 Personal history of malignant melanoma of skin: Secondary | ICD-10-CM

## 2020-02-13 DIAGNOSIS — F319 Bipolar disorder, unspecified: Secondary | ICD-10-CM | POA: Diagnosis present

## 2020-02-13 DIAGNOSIS — Z9889 Other specified postprocedural states: Secondary | ICD-10-CM

## 2020-02-13 DIAGNOSIS — Z885 Allergy status to narcotic agent status: Secondary | ICD-10-CM | POA: Diagnosis not present

## 2020-02-13 DIAGNOSIS — E876 Hypokalemia: Secondary | ICD-10-CM | POA: Diagnosis present

## 2020-02-13 DIAGNOSIS — I2510 Atherosclerotic heart disease of native coronary artery without angina pectoris: Secondary | ICD-10-CM | POA: Diagnosis present

## 2020-02-13 DIAGNOSIS — Z87891 Personal history of nicotine dependence: Secondary | ICD-10-CM

## 2020-02-13 DIAGNOSIS — E8770 Fluid overload, unspecified: Secondary | ICD-10-CM | POA: Diagnosis present

## 2020-02-13 DIAGNOSIS — Z8052 Family history of malignant neoplasm of bladder: Secondary | ICD-10-CM

## 2020-02-13 DIAGNOSIS — Z800 Family history of malignant neoplasm of digestive organs: Secondary | ICD-10-CM

## 2020-02-13 DIAGNOSIS — J90 Pleural effusion, not elsewhere classified: Secondary | ICD-10-CM | POA: Diagnosis present

## 2020-02-13 DIAGNOSIS — Z862 Personal history of diseases of the blood and blood-forming organs and certain disorders involving the immune mechanism: Secondary | ICD-10-CM

## 2020-02-13 DIAGNOSIS — R0603 Acute respiratory distress: Secondary | ICD-10-CM | POA: Diagnosis present

## 2020-02-13 DIAGNOSIS — R0609 Other forms of dyspnea: Secondary | ICD-10-CM

## 2020-02-13 DIAGNOSIS — N186 End stage renal disease: Secondary | ICD-10-CM | POA: Diagnosis present

## 2020-02-13 DIAGNOSIS — R0600 Dyspnea, unspecified: Secondary | ICD-10-CM

## 2020-02-13 DIAGNOSIS — I351 Nonrheumatic aortic (valve) insufficiency: Secondary | ICD-10-CM | POA: Diagnosis present

## 2020-02-13 DIAGNOSIS — I120 Hypertensive chronic kidney disease with stage 5 chronic kidney disease or end stage renal disease: Secondary | ICD-10-CM | POA: Diagnosis present

## 2020-02-13 DIAGNOSIS — Z20822 Contact with and (suspected) exposure to covid-19: Secondary | ICD-10-CM | POA: Diagnosis present

## 2020-02-13 DIAGNOSIS — R0602 Shortness of breath: Secondary | ICD-10-CM

## 2020-02-13 DIAGNOSIS — Z86718 Personal history of other venous thrombosis and embolism: Secondary | ICD-10-CM

## 2020-02-13 DIAGNOSIS — L409 Psoriasis, unspecified: Secondary | ICD-10-CM | POA: Diagnosis present

## 2020-02-13 DIAGNOSIS — I340 Nonrheumatic mitral (valve) insufficiency: Secondary | ICD-10-CM | POA: Diagnosis not present

## 2020-02-13 DIAGNOSIS — I10 Essential (primary) hypertension: Secondary | ICD-10-CM | POA: Diagnosis present

## 2020-02-13 DIAGNOSIS — Z992 Dependence on renal dialysis: Secondary | ICD-10-CM

## 2020-02-13 DIAGNOSIS — Z79890 Hormone replacement therapy (postmenopausal): Secondary | ICD-10-CM

## 2020-02-13 DIAGNOSIS — E785 Hyperlipidemia, unspecified: Secondary | ICD-10-CM | POA: Diagnosis present

## 2020-02-13 LAB — COMPREHENSIVE METABOLIC PANEL
ALT: 10 U/L (ref 0–44)
AST: 26 U/L (ref 15–41)
Albumin: 2.7 g/dL — ABNORMAL LOW (ref 3.5–5.0)
Alkaline Phosphatase: 94 U/L (ref 38–126)
Anion gap: 11 (ref 5–15)
BUN: 16 mg/dL (ref 8–23)
CO2: 26 mmol/L (ref 22–32)
Calcium: 7.8 mg/dL — ABNORMAL LOW (ref 8.9–10.3)
Chloride: 98 mmol/L (ref 98–111)
Creatinine, Ser: 4.01 mg/dL — ABNORMAL HIGH (ref 0.61–1.24)
GFR, Estimated: 15 mL/min — ABNORMAL LOW (ref >60)
Glucose, Bld: 131 mg/dL — ABNORMAL HIGH (ref 70–99)
Potassium: 3.4 mmol/L — ABNORMAL LOW (ref 3.5–5.1)
Sodium: 135 mmol/L (ref 135–145)
Total Bilirubin: 0.9 mg/dL (ref 0.3–1.2)
Total Protein: 7 g/dL (ref 6.5–8.1)

## 2020-02-13 LAB — CBC WITH DIFFERENTIAL/PLATELET
Abs Immature Granulocytes: 0.06 10*3/uL (ref 0.00–0.07)
Basophils Absolute: 0.1 10*3/uL (ref 0.0–0.1)
Basophils Relative: 1 %
Eosinophils Absolute: 0.1 10*3/uL (ref 0.0–0.5)
Eosinophils Relative: 1 %
HCT: 24.4 % — ABNORMAL LOW (ref 39.0–52.0)
Hemoglobin: 7.7 g/dL — ABNORMAL LOW (ref 13.0–17.0)
Immature Granulocytes: 1 %
Lymphocytes Relative: 37 %
Lymphs Abs: 3.6 10*3/uL (ref 0.7–4.0)
MCH: 31 pg (ref 26.0–34.0)
MCHC: 31.6 g/dL (ref 30.0–36.0)
MCV: 98.4 fL (ref 80.0–100.0)
Monocytes Absolute: 0.9 10*3/uL (ref 0.1–1.0)
Monocytes Relative: 10 %
Neutro Abs: 4.8 10*3/uL (ref 1.7–7.7)
Neutrophils Relative %: 50 %
Platelets: 191 10*3/uL (ref 150–400)
RBC: 2.48 MIL/uL — ABNORMAL LOW (ref 4.22–5.81)
RDW: 15.4 % (ref 11.5–15.5)
WBC: 9.5 10*3/uL (ref 4.0–10.5)
nRBC: 0 % (ref 0.0–0.2)

## 2020-02-13 LAB — TROPONIN I (HIGH SENSITIVITY)
Troponin I (High Sensitivity): 559 ng/L (ref <18)
Troponin I (High Sensitivity): 394 ng/L (ref <18)

## 2020-02-13 LAB — RESPIRATORY PANEL BY RT PCR (FLU A&B, COVID)
Influenza A by PCR: NEGATIVE
Influenza B by PCR: NEGATIVE
SARS Coronavirus 2 by RT PCR: NEGATIVE

## 2020-02-13 MED ORDER — ASPIRIN 81 MG PO CHEW
243.0000 mg | CHEWABLE_TABLET | Freq: Once | ORAL | Status: AC
  Administered 2020-02-13: 243 mg via ORAL

## 2020-02-13 MED ORDER — ASPIRIN 81 MG PO CHEW
324.0000 mg | CHEWABLE_TABLET | Freq: Once | ORAL | Status: DC
  Filled 2020-02-13: qty 4

## 2020-02-13 MED ORDER — SODIUM CHLORIDE 0.9% IV SOLUTION
Freq: Once | INTRAVENOUS | Status: DC
  Filled 2020-02-13: qty 250

## 2020-02-13 NOTE — ED Notes (Signed)
Date and time results received: 02/13/20 2045 (use smartphrase ".now" to insert current time)  Test: Troponin Critical Value: [id number]  Name of Provider Notified: Dr. Tamala Julian  Orders Received? Or Actions Taken?: Provider notified

## 2020-02-13 NOTE — H&P (Signed)
History and Physical   [name] XBM:[id number] DOB: 08/30/1940 DOA: 02/13/2020  Referring MD/NP/PA: Dr. Vladimir Crofts  PCP: [facility] [place]   Outpatient Specialists: [facility]  Patient coming from: Skilled nursing facility  Chief Complaint: Shortness of breath  HPI: Dalton Roy is a 79 y.o. male with medical history significant of recent coronary artery bypass grafting about 4 weeks ago complicated with end-stage renal disease currently on hemodialysis, bipolar disorder, hypertension, hypothyroidism, history of ITP and psoriasis who gets his care anteriorly through the [place] system so we are unable to get any records at the moment.  Patient apparently went to skilled nursing facility only 5 days ago.  Since then he has progressively felt weak and short of breath.  His wife at bedside said they have been planning to take him home.  Currently getting equipment for his home use but not completely delivered.  While in the process he came to the ER where he was noted to have significant pleural effusion and elevated troponin.  He has no chest pain.  He is just feeling weak.  Currently requiring 4 L of oxygen and comfortable on that.  Cardiology consulted and believes elevated troponin is due to his renal situation as well as recent coronary artery bypass grafting.  Patient has had dialysis but still has significant pleural effusion which will require draining.  He is being admitted to the hospital therefore for evaluation and treatment..  ED Course: Temperature 98.1 blood pressure 115/84 pulse 84 respirate of 23 oxygen sat 93% room air.  White count 9.5 hemoglobin 7.7 platelets 191.  Sodium is 135 potassium 3.4 chloride 98 CO2 26 BUN 16 creatinine 4.01 calcium 7.8.  Glucose is 131.  Albumin 2.7 troponin V 5 9 initially.  COVID-19 is negative.  Chest x-ray shows bilateral pleural effusions moderate to large on the right and small to moderate on the left.  They appear to be partially  loculated.  Patient is being admitted to the hospital for evaluation and treatment.  Review of Systems: As per HPI otherwise 10 point review of systems negative.    Past Medical History:  Diagnosis Date  . Bipolar 1 disorder ([place])   . Cancer ([place]) 20 years   skin  . Gout   . Heart murmur   . Hypertension   . Hypothyroidism   . ITP (idiopathic thrombocytopenic purpura)   . Migraines   . Psoriasis     Past Surgical History:  Procedure Laterality Date  . COLONOSCOPY N/A 08/31/2014   Procedure: COLONOSCOPY;  Surgeon: Dalton Bellow, MD;  Location: [facility] ENDOSCOPY;  Service: Endoscopy;  Laterality: N/A;  . COLONOSCOPY W/ POLYPECTOMY  09/12/2007   Tubular adenoma from the proximal transverse colon without atypia.  [facility] [place]  2001  . LITHOTRIPSY    . MELANOMA EXCISION  1998     reports that he quit smoking about 53 years ago. His smoking use included cigarettes. He has quit using smokeless tobacco. He reports current alcohol use. He reports that he does not use drugs.  Allergies  Allergen Reactions  . Codeine Other (See Comments)    hyperactive    Family History  Problem Relation Age of Onset  . Colon cancer Brother        Half brother  . Cancer Father        bladder cancer     Prior to Admission medications   Medication Sig Start Date End Date Taking? Authorizing Provider  allopurinol (ZYLOPRIM)  300 MG tablet TAKE 1 TABLET(300 MG) BY MOUTH DAILY Patient taking differently: Take 300 mg by mouth daily. TAKE 1 TABLET(300 MG) BY MOUTH DAILY 09/24/16   Pleas Koch, NP  ammonium lactate (LAC-HYDRIN) 12 % lotion Apply 1 application topically as needed for dry skin.    [provider]  atorvastatin (LIPITOR) 40 MG tablet Take 20 mg by mouth at bedtime.    [provider]  DULoxetine (CYMBALTA) 60 MG capsule Take 60 mg by mouth 2 (two) times daily.    [provider]  fluocinonide ointment (LIDEX) 2.84 % Apply 1 application topically  daily as needed (dry skin).    [provider]  fluticasone (FLONASE) 50 MCG/ACT nasal spray Place 1 spray into both nostrils daily.    [provider]  gabapentin (NEURONTIN) 400 MG capsule Take 400 mg by mouth 2 (two) times daily.    [provider]  hydrophilic ointment Apply 1 application topically as needed for dry skin.    [provider]  levothyroxine (SYNTHROID) 50 MCG tablet Take 50 mcg by mouth daily before breakfast.    [provider]  levothyroxine (SYNTHROID, LEVOTHROID) 75 MCG tablet Take 1 tablet (75 mcg total) by mouth daily before breakfast. 10/12/15   Pleas Koch, NP  omeprazole (PRILOSEC) 20 MG capsule Take 20 mg by mouth as needed (acid reflux).     [provider]  Salicylic Acid, Acne, (SALICYLIC ACID EX) Apply 1 application topically at bedtime.    [provider]  sildenafil (VIAGRA) 50 MG tablet Take 25 mg by mouth daily as needed for erectile dysfunction.    [provider]  SUMAtriptan (IMITREX) 100 MG tablet Take 50 mg by mouth every 2 (two) hours as needed for migraine (Pt takes 1 every night at bedtime). May repeat in 2 hours if headache persists or recurs.     [provider]  verapamil (VERELAN PM) 120 MG 24 hr capsule Take 120 mg by mouth daily at 6 (six) AM.     [provider]    Physical Exam: Vitals:   02/13/20 2100 02/13/20 2201 02/13/20 2203 02/13/20 2300  BP: 97/76 108/78  105/71  Pulse: 80 80 79 80  Resp:  (!) $Re'23 19 16  'IDp$ Temp:      TempSrc:      SpO2: 93% 93% 97% 96%  Weight:      Height:          Constitutional: Acutely ill looking, pleasant, no distress Vitals:   02/13/20 2100 02/13/20 2201 02/13/20 2203 02/13/20 2300  BP: 97/76 108/78  105/71  Pulse: 80 80 79 80  Resp:  (!) $Re'23 19 16  'IaI$ Temp:      TempSrc:      SpO2: 93% 93% 97% 96%  Weight:      Height:       Eyes: PERRL, lids and conjunctivae normal ENMT: Mucous membranes are moist.  Posterior pharynx clear of any exudate or lesions.Normal dentition.  Neck: normal, supple, no masses, no thyromegaly Respiratory: Coarse breath sounds bilaterally with diffuse basal crackles normal respiratory effort. No accessory muscle use.  Cardiovascular: Regular rate and rhythm, no murmurs / rubs / gallops. No extremity edema. 2+ pedal pulses. No carotid bruits.  Abdomen: no tenderness, no masses palpated. No hepatosplenomegaly. Bowel sounds positive.  Musculoskeletal: no clubbing / cyanosis. No joint deformity upper and lower extremities. Good ROM, no contractures. Normal muscle tone.  Skin: no rashes, lesions, ulcers. No induration Neurologic:  CN 2-12 grossly intact. Sensation intact, DTR normal. Strength 5/5 in all 4.  Psychiatric: Normal judgment and insight. Alert and oriented x 3.  Depressed mood.     Labs on Admission: I have personally reviewed following labs and imaging studies  CBC: Recent Labs  Lab 02/13/20 2003  WBC 9.5  NEUTROABS 4.8  HGB 7.7*  HCT 24.4*  MCV 98.4  PLT 622   Basic Metabolic Panel: Recent Labs  Lab 02/13/20 2003  NA 135  K 3.4*  CL 98  CO2 26  GLUCOSE 131*  BUN 16  CREATININE 4.01*  CALCIUM 7.8*   GFR: Estimated Creatinine Clearance: 17.7 mL/min (A) (by C-G formula based on SCr of 4.01 mg/dL (H)). Liver Function Tests: Recent Labs  Lab 02/13/20 2003  AST 26  ALT 10  ALKPHOS 94  BILITOT 0.9  PROT 7.0  ALBUMIN 2.7*   No results for input(s): LIPASE, AMYLASE in the last 168 hours. No results for input(s): AMMONIA in the last 168 hours. Coagulation Profile: No results for input(s): INR, PROTIME in the last 168 hours. Cardiac Enzymes: No results for input(s): CKTOTAL, CKMB, CKMBINDEX, TROPONINI in the last 168 hours. BNP (last 3 results) No results for input(s): PROBNP in the last 8760 hours. HbA1C: No results for input(s): HGBA1C in the last 72 hours. CBG: No results for input(s): GLUCAP in the last 168 hours. Lipid  Profile: No results for input(s): CHOL, HDL, LDLCALC, TRIG, CHOLHDL, LDLDIRECT in the last 72 hours. Thyroid Function Tests: No results for input(s): TSH, T4TOTAL, FREET4, T3FREE, THYROIDAB in the last 72 hours. Anemia Panel: No results for input(s): VITAMINB12, FOLATE, FERRITIN, TIBC, IRON, RETICCTPCT in the last 72 hours. Urine analysis:    Component Value Date/Time   COLORURINE YELLOW 05/22/2019 0040   APPEARANCEUR HAZY (A) 05/22/2019 0040   LABSPEC 1.015 05/22/2019 0040   PHURINE 5.0 05/22/2019 0040   GLUCOSEU NEGATIVE 05/22/2019 0040   HGBUR NEGATIVE 05/22/2019 0040   BILIRUBINUR NEGATIVE 05/22/2019 0040   KETONESUR NEGATIVE 05/22/2019 0040   PROTEINUR NEGATIVE 05/22/2019 0040   NITRITE NEGATIVE 05/22/2019 0040   LEUKOCYTESUR NEGATIVE 05/22/2019 0040   Sepsis Labs: $RemoveBefo'[email or web address]'[id number]$ (procalcitonin:4,lacticidven:4) ) Recent Results (from the past 240 hour(s))  Respiratory Panel by RT PCR (Flu A&B, Covid) - Nasopharyngeal Swab     Status: None   Collection Time: 02/13/20 10:17 PM   Specimen: Nasopharyngeal Swab  Result Value Ref Range Status   SARS Coronavirus 2 by RT PCR NEGATIVE NEGATIVE Final    Comment: (NOTE) SARS-CoV-2 target nucleic acids are NOT DETECTED.  The SARS-CoV-2 RNA is generally detectable in upper respiratoy specimens during the acute phase of infection. The lowest concentration of SARS-CoV-2 viral copies this assay can detect is 131 copies/mL. A negative result does not preclude SARS-Cov-2 infection and should not be used as the sole basis for treatment or other patient management decisions. A negative result may occur with  improper specimen collection/handling, submission of specimen other than nasopharyngeal swab, presence of viral mutation(s) within the areas targeted by this assay, and inadequate number of viral copies (<131 copies/mL). A negative result must be combined with clinical observations, patient history, and epidemiological information.  The expected result is Negative.  Fact Sheet for Patients:  PinkCheek.be  Fact Sheet for Healthcare Providers:  GravelBags.it  This test is no t yet approved or cleared by the [place] FDA and  has been authorized for detection and/or diagnosis of SARS-CoV-2 by FDA under an Emergency Use Authorization (EUA). This EUA will remain  in effect (meaning this test can be used) for the duration of the COVID-19 declaration under Section 564(b)(1) of the Act, 21 U.S.C. section 360bbb-3(b)(1), unless the authorization is terminated or revoked sooner.     Influenza A by PCR NEGATIVE NEGATIVE Final   Influenza B by PCR NEGATIVE NEGATIVE Final    Comment: (NOTE) The Xpert Xpress SARS-CoV-2/FLU/RSV assay is intended as an aid in  the diagnosis of influenza from Nasopharyngeal swab specimens and  should not be used as a sole basis for treatment. Nasal washings and  aspirates are unacceptable for Xpert Xpress SARS-CoV-2/FLU/RSV  testing.  Fact Sheet for Patients: PinkCheek.be  Fact Sheet for Healthcare Providers: GravelBags.it  This test is not yet approved or cleared by the [place] FDA and  has been authorized for detection and/or diagnosis of SARS-CoV-2 by  FDA under an Emergency Use Authorization (EUA). This EUA will remain  in effect (meaning this test can be used) for the duration of the  Covid-19 declaration under Section 564(b)(1) of the Act, 21  U.S.C. section 360bbb-3(b)(1), unless the authorization is  terminated or revoked. Performed at [facility], [address], [place], [place] [address]      Radiological Exams on Admission: DG Chest 2 View  Result Date: 02/13/2020 CLINICAL DATA:  Pleural effusion EXAM: CHEST - 2 VIEW COMPARISON:  05/22/2019 FINDINGS: There is a well-positioned left-sided tunneled dialysis catheter. The patient is  status post prior median sternotomy. The cardiac silhouette is enlarged. There are bilateral pleural effusions, moderate to large on the right and small to moderate on the left. Both of these pleural effusions appear to be at least partially loculated. There is adjacent airspace disease favored to represent atelectasis. There is no pneumothorax. IMPRESSION: 1. Bilateral pleural effusions, moderate to large on the right and small to moderate on the left. Both of these pleural effusions appear to be partially loculated. 2. Cardiomegaly. Electronically Signed   By: Constance Holster M.D.   On: 02/13/2020 20:59    EKG: Independently reviewed.  Sinus rhythm no significant ST changes  Assessment/Plan Principal Problem:   Pleural effusion, right Active Problems:   Essential hypertension   ESRD (end stage renal disease) (HCC)   Hypokalemia   Troponin I above reference range   Symptomatic anemia     #1 bilateral right more than left pleural effusion: Patient will be admitted for evaluation.  He has had hemodialysis but still has significant effusion.  He might require IR mediated thoracentesis at least on the right.  The fluid seems mildly loculated so may not be diuresis easily.  Suspect this is post CABG.  Will obtain records from his [place] hopefully in the morning.  #2 end-stage renal disease: Patient apparently has had dialysis today.  We will monitor and probably get nephrology to plan further dialysis.  #3 elevated troponins: Suspected due to his renal situation as well as recent coronary artery bypass graft.  Monitor closely.  #4 symptomatic anemia: Patient's hemoglobin 7.7 may be leading to demand ischemia.  I will transfuse at least 1 unit of packed red blood cells.  Monitor closely.  #5 hypokalemia: She will be repleted through hemodialysis.  #6 essential hypertension: Maintain home regimen.   DVT prophylaxis: Heparin for now Code Status: Full code Family Communication: Wife  at bedside Disposition Plan: From skilled facility but patient would like to go home from here Consults called: Cardiology called by the ER.  Consult nephrology in the morning Admission status: Inpatient  Severity  of Illness: The appropriate patient status for this patient is INPATIENT. Inpatient status is judged to be reasonable and necessary in order to provide the required intensity of service to ensure the patient's safety. The patient's presenting symptoms, physical exam findings, and initial radiographic and laboratory data in the context of their chronic comorbidities is felt to place them at high risk for further clinical deterioration. Furthermore, it is not anticipated that the patient will be medically stable for discharge from the hospital within 2 midnights of admission. The following factors support the patient status of inpatient.   " The patient's presenting symptoms include shortness of breath. " The worrisome physical exam findings include bilateral basal crackles. " The initial radiographic and laboratory data are worrisome because of x-ray showing pleural effusion. " The chronic co-morbidities include coronary artery disease with end-stage renal disease.   * I certify that at the point of admission it is my clinical judgment that the patient will require inpatient hospital care spanning beyond 2 midnights from the point of admission due to high intensity of service, high risk for further deterioration and high frequency of surveillance required.Barbette Merino MD [facility] Pager [phone]  If 7PM-7AM, please contact night-coverage [email or web address] Password [email or web address]  02/13/2020, 11:45 PM

## 2020-02-13 NOTE — ED Notes (Signed)
Pt states coming in for possible pneumonia. Pt denies pain or shortness of breath. Pt states recent heart surgery with a triple bipass and valve surgery. Pt placed on cardiac, bp and pulse ox monitor. Pt placed on 2LPM via [place] in triage.

## 2020-02-13 NOTE — ED Provider Notes (Signed)
[facility] Emergency Department Provider Note ____________________________________________   First MD Initiated Contact with Patient 02/13/20 2009     (approximate)  I have reviewed the triage vital signs and the nursing notes.  HISTORY  Chief Complaint Shortness of Breath   HPI Dalton Roy is a 79 y.o. malewho presents to the ED for evaluation of shortness of breath.   Chart review indicates minimal history within the medical system.  Patient presents to the ED with his wife due to concerns for shortness of breath in the setting of recently diagnosed pleural effusion.  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  They report a prolonged course requiring 2 weeks of intubation.  Placement of a left-sided tunneled dialysis catheter to initiate iHD.  Therefore patient was just discharged in the hospital 1 week ago and since her local SNF for rehab.  Patient had regularly scheduled dialysis this morning which she attended.  Patient reports that he has noticed shortness of breath with exertion over the past few days.  His nurse was concerned about this yesterday, precipitating a CXR demonstrating pleural effusions.  Patient refused transport last night because of his insistence of making his dialysis appointment this morning.  After he finished dialysis, his wife brings him to the ED for evaluation.  Patient denies any chest pain, syncope, injury, cough, fever, emesis or abdominal pain.  He reports dyspnea on exertion is his only complaint.   Past Medical History:  Diagnosis Date  . Bipolar 1 disorder ([place])   . Cancer ([place]) 20 years   skin  . Gout   . Heart murmur   . Hypertension   . Hypothyroidism   . ITP (idiopathic thrombocytopenic purpura)   . Migraines   . Psoriasis     Patient Active Problem List   Diagnosis Date Noted  . Pleural effusion, right 02/13/2020  . ESRD (end stage renal disease) ([place])  02/13/2020  . Hypokalemia 02/13/2020  . Troponin I above reference range 02/13/2020  . Symptomatic anemia 02/13/2020  . Acute blood loss anemia 05/29/2019  . Epistaxis, recurrent 05/29/2019  . Syncope 05/23/2019  . Dyspnea 05/23/2019  . Near syncope 05/22/2019  . Medicare annual wellness visit, subsequent 08/24/2015  . Arthritis 05/10/2015  . Essential hypertension 01/27/2015  . Gout 01/27/2015  . Frequent headaches 01/27/2015  . Hypothyroidism 01/27/2015  . History of colonic polyps 08/30/2014    Past Surgical History:  Procedure Laterality Date  . COLONOSCOPY N/A 08/31/2014   Procedure: COLONOSCOPY;  Surgeon: Robert Bellow, MD;  Location: [facility] ENDOSCOPY;  Service: Endoscopy;  Laterality: N/A;  . COLONOSCOPY W/ POLYPECTOMY  09/12/2007   Tubular adenoma from the proximal transverse colon without atypia.  [facility] [place]  2001  . LITHOTRIPSY    . [place]    Prior to Admission medications   Medication Sig Start Date End Date Taking? Authorizing Provider  allopurinol (ZYLOPRIM) 300 MG tablet TAKE 1 TABLET(300 MG) BY MOUTH DAILY Patient taking differently: Take 300 mg by mouth daily. TAKE 1 TABLET(300 MG) BY MOUTH DAILY 09/24/16   Pleas Koch, NP  ammonium lactate (LAC-HYDRIN) 12 % lotion Apply 1 application topically as needed for dry skin.    [provider]  atorvastatin (LIPITOR) 40 MG tablet Take 20 mg by mouth at bedtime.    [provider]  DULoxetine (CYMBALTA) 60 MG capsule Take 60 mg by mouth 2 (two) times daily.  [provider]  fluocinonide ointment (LIDEX) 7.58 % Apply 1 application topically daily as needed (dry skin).    [provider]  fluticasone (FLONASE) 50 MCG/ACT nasal spray Place 1 spray into both nostrils daily.    [provider]  gabapentin (NEURONTIN) 400 MG capsule Take 400 mg by mouth 2 (two) times daily.    [provider]  hydrophilic ointment Apply 1 application  topically as needed for dry skin.    [provider]  levothyroxine (SYNTHROID) 50 MCG tablet Take 50 mcg by mouth daily before breakfast.    [provider]  levothyroxine (SYNTHROID, LEVOTHROID) 75 MCG tablet Take 1 tablet (75 mcg total) by mouth daily before breakfast. 10/12/15   Pleas Koch, NP  omeprazole (PRILOSEC) 20 MG capsule Take 20 mg by mouth as needed (acid reflux).     [provider]  Salicylic Acid, Acne, (SALICYLIC ACID EX) Apply 1 application topically at bedtime.    [provider]  sildenafil (VIAGRA) 50 MG tablet Take 25 mg by mouth daily as needed for erectile dysfunction.    [provider]  SUMAtriptan (IMITREX) 100 MG tablet Take 50 mg by mouth every 2 (two) hours as needed for migraine (Pt takes 1 every night at bedtime). May repeat in 2 hours if headache persists or recurs.     [provider]  verapamil (VERELAN PM) 120 MG 24 hr capsule Take 120 mg by mouth daily at 6 (six) AM.     [provider]    Allergies Codeine  Family History  Problem Relation Age of Onset  . Colon cancer Brother        Half brother  . Cancer Father        bladder cancer    Social History Social History   Tobacco Use  . Smoking status: Former Smoker    Types: Cigarettes    Quit date: 04/09/1966    Years since quitting: 53.8  . Smokeless tobacco: Former Network engineer Use Topics  . Alcohol use: Yes    Alcohol/week: 0.0 standard drinks  . Drug use: No    Review of Systems  Constitutional: No fever/chills Eyes: No visual changes. ENT: No sore throat. Cardiovascular: Denies chest pain. Respiratory: Positive shortness of breath Gastrointestinal: No abdominal pain.  No nausea, no vomiting.  No diarrhea.  No constipation. Genitourinary: Negative for dysuria. Musculoskeletal: Negative for back pain. Skin: Negative for rash. Neurological: Negative for headaches, focal weakness or  numbness.  ____________________________________________   PHYSICAL EXAM:  VITAL SIGNS: Vitals:   02/13/20 2203 02/13/20 2300  BP:  105/71  Pulse: 79 80  Resp: 19 16  Temp:    SpO2: 97% 96%     Constitutional: Alert and oriented. Well appearing and in no acute distress. Eyes: Conjunctivae are normal. PERRL. EOMI. Head: Atraumatic. Nose: No congestion/rhinnorhea. Mouth/Throat: Mucous membranes are moist.  Oropharynx non-erythematous. Neck: No stridor. No cervical spine tenderness to palpation. Cardiovascular: Normal rate, regular rhythm. Grossly normal heart sounds.  Good peripheral circulation. Respiratory: Normal respiratory effort.  No retractions Bibasilar crackles are present.  Good air movement throughout.  Decreased breath sounds to the right base. Gastrointestinal: Soft , nondistended, nontender to palpation. No CVA tenderness. Musculoskeletal: No lower extremity tenderness nor edema.  No joint effusions. No signs of acute trauma. Tunneled catheter to his left chest, clean dry intact. Neurologic:  Normal speech and language. No gross focal neurologic deficits are appreciated. No gait instability noted. Skin:  Skin  is warm, dry and intact. No rash noted. Psychiatric: Mood and affect are normal. Speech and behavior are normal.  ____________________________________________   LABS (all labs ordered are listed, but only abnormal results are displayed)  Labs Reviewed  CBC WITH DIFFERENTIAL/PLATELET - Abnormal; Notable for the following components:      Result Value   RBC 2.48 (*)    Hemoglobin 7.7 (*)    HCT 24.4 (*)    All other components within normal limits  COMPREHENSIVE METABOLIC PANEL - Abnormal; Notable for the following components:   Potassium 3.4 (*)    Glucose, Bld 131 (*)    Creatinine, Ser 4.01 (*)    Calcium 7.8 (*)    Albumin 2.7 (*)    GFR, Estimated 15 (*)    All other components within normal limits  TROPONIN I (HIGH SENSITIVITY) - Abnormal;  Notable for the following components:   Troponin I (High Sensitivity) 559 (*)    All other components within normal limits  TROPONIN I (HIGH SENSITIVITY) - Abnormal; Notable for the following components:   Troponin I (High Sensitivity) 394 (*)    All other components within normal limits  RESPIRATORY PANEL BY RT PCR (FLU A&B, COVID)  PREPARE RBC (CROSSMATCH)   ____________________________________________  12 Lead EKG  Sinus rhythm, left axis.  QTC 509 and intervals otherwise normal.  No evidence of acute ischemia. ____________________________________________  RADIOLOGY  ED MD interpretation: 2 view CXR reviewed by me with evidence of right greater than left pleural effusion with associated RLL atelectasis  Official radiology report(s): DG Chest 2 View  Result Date: 02/13/2020 CLINICAL DATA:  Pleural effusion EXAM: CHEST - 2 VIEW COMPARISON:  05/22/2019 FINDINGS: There is a well-positioned left-sided tunneled dialysis catheter. The patient is status post prior median sternotomy. The cardiac silhouette is enlarged. There are bilateral pleural effusions, moderate to large on the right and small to moderate on the left. Both of these pleural effusions appear to be at least partially loculated. There is adjacent airspace disease favored to represent atelectasis. There is no pneumothorax. IMPRESSION: 1. Bilateral pleural effusions, moderate to large on the right and small to moderate on the left. Both of these pleural effusions appear to be partially loculated. 2. Cardiomegaly. Electronically Signed   By: Constance Holster M.D.   On: 02/13/2020 20:59   ____________________________________________   PROCEDURES and INTERVENTIONS  Procedure(s) performed (including Critical Care):  Procedures  Medications  0.9 %  sodium chloride infusion (Manually program via Guardrails IV Fluids) (has no administration in time range)  aspirin chewable tablet 243 mg (243 mg Oral Given 02/13/20 2209)     ____________________________________________   MDM / ED COURSE   79 year old patient 1 month out from three-vessel CABG base of the ED with shortness of breath, likely due to pleural effusions and associated type II increase in troponin, requiring medical admission.  Patient with oxygen saturation in the low 90s on room air, requiring 2 L nasal cannula, otherwise normal vital signs.  Exam demonstrates no evidence of distress or neurovascular deficits.  Patient is stoic, and only endorsing dyspnea on exertion.  CXR demonstrates right greater than left pleural effusions as a likely source of his symptoms.  Blood work shows surprising elevated troponin.  Patient has no chest pain and EKG is nonischemic.  Discussed the case with cardiology on-call, who indicates that his troponin elevation is likely due to poor clearance in the setting of ESRD and/or secondary to his respiratory pathology.  We will admit the patient  to hospitalist medicine for further work-up and management of his pleural effusions and associated shortness of breath.   Clinical Course as of Feb 13 13  Sat Feb 13, 2020  2049 Eula Fried, wife. She is on her way to the hospital and would prefer to speak in person.   [DS]  2142 Long discussion at the bedside with patient and his wife about work-up with pleural effusions and elevated troponin.  He reports desire to be in the care of the surgeons who performed his CABG,, which is reasonable.  We will page [facility] CT surgery   [DS]  2149 Spoke with representative from to transfer center who requires clinical information and will page CT surgery   [DS]  2156 [facility] has no records of this patient   [DS]  2202 Will page cards here   [DS]  2213 Spoke with Dr. Garen Lah.  Trend troponins, treat effusions.    [DS]  2233 Spoke with admitting hospitalist, who agrees to see the patient for admission.   [DS]    Clinical Course User Index [DS] Vladimir Crofts, MD     ____________________________________________   FINAL CLINICAL IMPRESSION(S) / ED DIAGNOSES  Final diagnoses:  Pleural effusion, bilateral  DOE (dyspnea on exertion)     ED Discharge Orders    None       Keelie Zemanek   Note:  This document was prepared using Dragon voice recognition software and may include unintentional dictation errors.   Vladimir Crofts, MD 02/14/20 [phone]

## 2020-02-13 NOTE — ED Notes (Signed)
Pt taken off of oxygen for room air saturation, as per provider. Pt was at 99% on 2LPM via [place].

## 2020-02-13 NOTE — ED Triage Notes (Signed)
Patient to triage via wheelchair by EMS from local nsg home.  Per EMS patient was diagnosed today with pleural effusion by x-ray and family wanted him to be checked, out.  EMS interventions -- bp 127/80, hr 80, rr 16, pulse oxi 90% on room air up to 94-95% with O2 at 2 liters via nasal cannula.

## 2020-02-14 ENCOUNTER — Encounter: Payer: Self-pay | Admitting: Internal Medicine

## 2020-02-14 DIAGNOSIS — I2510 Atherosclerotic heart disease of native coronary artery without angina pectoris: Secondary | ICD-10-CM | POA: Diagnosis not present

## 2020-02-14 DIAGNOSIS — N186 End stage renal disease: Secondary | ICD-10-CM | POA: Diagnosis not present

## 2020-02-14 DIAGNOSIS — R778 Other specified abnormalities of plasma proteins: Secondary | ICD-10-CM | POA: Diagnosis not present

## 2020-02-14 DIAGNOSIS — J90 Pleural effusion, not elsewhere classified: Secondary | ICD-10-CM | POA: Diagnosis not present

## 2020-02-14 LAB — COMPREHENSIVE METABOLIC PANEL
ALT: 10 U/L (ref 0–44)
AST: 21 U/L (ref 15–41)
Albumin: 2.7 g/dL — ABNORMAL LOW (ref 3.5–5.0)
Alkaline Phosphatase: 86 U/L (ref 38–126)
Anion gap: 9 (ref 5–15)
BUN: 22 mg/dL (ref 8–23)
CO2: 30 mmol/L (ref 22–32)
Calcium: 8.1 mg/dL — ABNORMAL LOW (ref 8.9–10.3)
Chloride: 99 mmol/L (ref 98–111)
Creatinine, Ser: 4.84 mg/dL — ABNORMAL HIGH (ref 0.61–1.24)
GFR, Estimated: 12 mL/min — ABNORMAL LOW (ref >60)
Glucose, Bld: 88 mg/dL (ref 70–99)
Potassium: 3.5 mmol/L (ref 3.5–5.1)
Sodium: 138 mmol/L (ref 135–145)
Total Bilirubin: 1.1 mg/dL (ref 0.3–1.2)
Total Protein: 6.7 g/dL (ref 6.5–8.1)

## 2020-02-14 LAB — TROPONIN I (HIGH SENSITIVITY)
Troponin I (High Sensitivity): 496 ng/L (ref <18)
Troponin I (High Sensitivity): 464 ng/L (ref <18)
Troponin I (High Sensitivity): 526 ng/L (ref <18)
Troponin I (High Sensitivity): 509 ng/L (ref <18)

## 2020-02-14 LAB — CBC
HCT: 25.8 % — ABNORMAL LOW (ref 39.0–52.0)
Hemoglobin: 8.3 g/dL — ABNORMAL LOW (ref 13.0–17.0)
MCH: 30.9 pg (ref 26.0–34.0)
MCHC: 32.2 g/dL (ref 30.0–36.0)
MCV: 95.9 fL (ref 80.0–100.0)
Platelets: 176 10*3/uL (ref 150–400)
RBC: 2.69 MIL/uL — ABNORMAL LOW (ref 4.22–5.81)
RDW: 16.1 % — ABNORMAL HIGH (ref 11.5–15.5)
WBC: 7.9 10*3/uL (ref 4.0–10.5)
nRBC: 0 % (ref 0.0–0.2)

## 2020-02-14 LAB — PREPARE RBC (CROSSMATCH)

## 2020-02-14 MED ORDER — SODIUM CHLORIDE 0.9% FLUSH: 3.0000 mL | INTRAVENOUS | Status: DC | PRN

## 2020-02-14 MED ORDER — ZOLPIDEM TARTRATE 5 MG PO TABS: 5.0000 mg | ORAL_TABLET | Freq: Every evening | ORAL | Status: DC | PRN

## 2020-02-14 MED ORDER — SORBITOL 70 % SOLN
30.0000 mL | Freq: Two times a day (BID) | Status: DC | PRN
  Filled 2020-02-14: qty 30

## 2020-02-14 MED ORDER — MELATONIN 5 MG PO TABS
5.0000 mg | ORAL_TABLET | Freq: Every day | ORAL | Status: DC
  Administered 2020-02-14 – 2020-02-15 (×2): 5 mg via ORAL
  Filled 2020-02-14 (×2): qty 1

## 2020-02-14 MED ORDER — GABAPENTIN 100 MG PO CAPS
200.0000 mg | ORAL_CAPSULE | Freq: Every day | ORAL | Status: DC
  Administered 2020-02-16: 100 mg via ORAL
  Filled 2020-02-14: qty 2

## 2020-02-14 MED ORDER — ONDANSETRON HCL 4 MG PO TABS: 4.0000 mg | ORAL_TABLET | Freq: Four times a day (QID) | ORAL | Status: DC | PRN

## 2020-02-14 MED ORDER — ATORVASTATIN CALCIUM 20 MG PO TABS
20.0000 mg | ORAL_TABLET | Freq: Every day | ORAL | Status: DC
  Administered 2020-02-14 – 2020-02-15 (×2): 20 mg via ORAL
  Filled 2020-02-14 (×2): qty 1

## 2020-02-14 MED ORDER — CHLORHEXIDINE GLUCONATE CLOTH 2 % EX PADS
6.0000 | MEDICATED_PAD | Freq: Every day | CUTANEOUS | Status: DC
  Administered 2020-02-14 – 2020-02-16 (×3): 6 via TOPICAL

## 2020-02-14 MED ORDER — HYDROXYZINE HCL 25 MG PO TABS: 25.0000 mg | ORAL_TABLET | Freq: Three times a day (TID) | ORAL | Status: DC | PRN

## 2020-02-14 MED ORDER — DOCUSATE SODIUM 283 MG RE ENEM
1.0000 | ENEMA | Freq: Three times a day (TID) | RECTAL | Status: DC | PRN
  Filled 2020-02-14: qty 1

## 2020-02-14 MED ORDER — ACETAMINOPHEN 325 MG PO TABS: 650.0000 mg | ORAL_TABLET | Freq: Four times a day (QID) | ORAL | Status: DC | PRN

## 2020-02-14 MED ORDER — CAMPHOR-MENTHOL 0.5-0.5 % EX LOTN
1.0000 "application " | TOPICAL_LOTION | Freq: Three times a day (TID) | CUTANEOUS | Status: DC | PRN
  Filled 2020-02-14: qty 222

## 2020-02-14 MED ORDER — FERROUS SULFATE 325 (65 FE) MG PO TABS
325.0000 mg | ORAL_TABLET | Freq: Every day | ORAL | Status: DC
  Administered 2020-02-14 – 2020-02-15 (×2): 325 mg via ORAL
  Filled 2020-02-14 (×2): qty 1

## 2020-02-14 MED ORDER — LEVOTHYROXINE SODIUM 50 MCG PO TABS
75.0000 ug | ORAL_TABLET | Freq: Every day | ORAL | Status: DC
Start: 2020-02-15 — End: 2020-02-16
  Administered 2020-02-16: 75 ug via ORAL
  Filled 2020-02-14: qty 2

## 2020-02-14 MED ORDER — HEPARIN SODIUM (PORCINE) 5000 UNIT/ML IJ SOLN
5000.0000 [IU] | Freq: Three times a day (TID) | INTRAMUSCULAR | Status: AC
  Administered 2020-02-14: 5000 [IU] via SUBCUTANEOUS
  Filled 2020-02-14: qty 1

## 2020-02-14 MED ORDER — GABAPENTIN 300 MG PO CAPS
300.0000 mg | ORAL_CAPSULE | Freq: Every day | ORAL | Status: DC
  Administered 2020-02-14: 300 mg via ORAL
  Filled 2020-02-14: qty 1

## 2020-02-14 MED ORDER — ASPIRIN EC 81 MG PO TBEC
81.0000 mg | DELAYED_RELEASE_TABLET | Freq: Every day | ORAL | Status: DC
  Administered 2020-02-15: 81 mg via ORAL
  Filled 2020-02-14: qty 1

## 2020-02-14 MED ORDER — SORBITOL 70 % SOLN
30.0000 mL | Status: DC | PRN
  Filled 2020-02-14: qty 30

## 2020-02-14 MED ORDER — GABAPENTIN 100 MG PO CAPS: 100.0000 mg | ORAL_CAPSULE | ORAL | Status: DC

## 2020-02-14 MED ORDER — ALLOPURINOL 100 MG PO TABS: 100.0000 mg | ORAL_TABLET | ORAL | Status: DC

## 2020-02-14 MED ORDER — CALCIUM CARBONATE ANTACID 1250 MG/5ML PO SUSP
500.0000 mg | Freq: Four times a day (QID) | ORAL | Status: DC | PRN
  Filled 2020-02-14: qty 5

## 2020-02-14 MED ORDER — ACETAMINOPHEN 650 MG RE SUPP: 650.0000 mg | Freq: Four times a day (QID) | RECTAL | Status: DC | PRN

## 2020-02-14 MED ORDER — TRAZODONE HCL 50 MG PO TABS
50.0000 mg | ORAL_TABLET | Freq: Every day | ORAL | Status: DC
  Administered 2020-02-14 – 2020-02-15 (×2): 50 mg via ORAL
  Filled 2020-02-14 (×2): qty 1

## 2020-02-14 MED ORDER — SODIUM CHLORIDE 0.9% FLUSH
3.0000 mL | Freq: Two times a day (BID) | INTRAVENOUS | Status: DC
  Administered 2020-02-14 – 2020-02-16 (×5): 3 mL via INTRAVENOUS

## 2020-02-14 MED ORDER — SODIUM CHLORIDE 0.9 % IV SOLN: 250.0000 mL | INTRAVENOUS | Status: DC | PRN

## 2020-02-14 MED ORDER — METOPROLOL TARTRATE 25 MG PO TABS
12.5000 mg | ORAL_TABLET | Freq: Two times a day (BID) | ORAL | Status: DC
  Administered 2020-02-14 – 2020-02-15 (×3): 12.5 mg via ORAL
  Filled 2020-02-14 (×3): qty 1

## 2020-02-14 MED ORDER — NEPRO/CARBSTEADY PO LIQD: 237.0000 mL | Freq: Three times a day (TID) | ORAL | Status: DC | PRN

## 2020-02-14 MED ORDER — ONDANSETRON HCL 4 MG/2ML IJ SOLN: 4.0000 mg | Freq: Four times a day (QID) | INTRAMUSCULAR | Status: DC | PRN

## 2020-02-14 MED ORDER — ESCITALOPRAM OXALATE 10 MG PO TABS
5.0000 mg | ORAL_TABLET | Freq: Every day | ORAL | Status: DC
  Administered 2020-02-14 – 2020-02-16 (×3): 5 mg via ORAL
  Filled 2020-02-14 (×3): qty 0.5

## 2020-02-14 NOTE — Consult Note (Signed)
[facility] CONSULT NOTE    Date: 02/14/2020                  Patient Name:  Dalton Roy  MRN: [id number]  DOB: [birth year]  Age / Sex: 79 y.o., male         PCP: [place]                 Service Requesting Consult: Hospitalist                 Reason for Consult: ESRD on dialysis,continuation of treatment             History of Present Illness: Patient is a 79 y.o. 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   Medications: Outpatient medications: Medications Prior to Admission  Medication Sig Dispense Refill Last Dose  . allopurinol (ZYLOPRIM) 300 MG tablet TAKE 1 TABLET(300 MG) BY MOUTH DAILY (Patient taking differently: Take 300 mg by mouth daily. TAKE 1 TABLET(300 MG) BY MOUTH DAILY) 90 tablet 1   . ammonium lactate (LAC-HYDRIN) 12 % lotion Apply 1 application topically as needed for dry skin.     Marland Kitchen atorvastatin (LIPITOR) 40 MG tablet Take 20 mg by mouth at bedtime.     . DULoxetine (CYMBALTA) 60 MG capsule Take 60 mg by mouth 2 (two) times daily.     . fluocinonide ointment (LIDEX) 8.32 % Apply 1 application topically daily as needed (dry skin).     . fluticasone (FLONASE) 50 MCG/ACT nasal spray Place 1 spray into both nostrils daily.     Marland Kitchen gabapentin (NEURONTIN) 400 MG capsule Take 400 mg by mouth 2 (two) times daily.     . hydrophilic ointment Apply 1 application topically as needed for dry skin.     Marland Kitchen levothyroxine (SYNTHROID) 50 MCG tablet Take 50 mcg by mouth daily before breakfast.     . levothyroxine (SYNTHROID, LEVOTHROID) 75 MCG tablet Take 1 tablet (75 mcg total) by mouth daily before breakfast. 90 tablet 3   . omeprazole (PRILOSEC) 20 MG capsule Take 20 mg by mouth as needed (acid reflux).      .  Salicylic Acid, Acne, (SALICYLIC ACID EX) Apply 1 application topically at bedtime.     . sildenafil (VIAGRA) 50 MG tablet Take 25 mg by mouth daily as needed for erectile dysfunction.     . SUMAtriptan (IMITREX) 100 MG tablet Take 50 mg by mouth every 2 (two) hours as needed for migraine (Pt takes 1 every night at bedtime). May repeat in 2 hours if headache persists or recurs.      . verapamil (VERELAN PM) 120 MG 24 hr capsule Take 120 mg by mouth daily at 6 (six) AM.        Current medications: Current Facility-Administered Medications  Medication Dose Route Frequency Provider Last Rate Last Admin  . 0.9 %  sodium chloride infusion (Manually program via Guardrails IV Fluids)   Intravenous Once Garba, Mohammad L, MD      . 0.9 %  sodium chloride infusion  250 mL Intravenous PRN Gala Romney L, MD      . acetaminophen (TYLENOL) tablet 650 mg  650 mg Oral Q6H PRN Elwyn Reach, MD       Or  .  acetaminophen (TYLENOL) suppository 650 mg  650 mg Rectal Q6H PRN Gala Romney L, MD      . calcium carbonate (dosed in mg elemental calcium) suspension 500 mg of elemental calcium  500 mg of elemental calcium Oral Q6H PRN Elwyn Reach, MD      . camphor-menthol (SARNA) lotion 1 application  1 application Topical W9U PRN Elwyn Reach, MD       And  . hydrOXYzine (ATARAX/VISTARIL) tablet 25 mg  25 mg Oral Q8H PRN Elwyn Reach, MD      . Chlorhexidine Gluconate Cloth 2 % PADS 6 each  6 each Topical Q0600 Elwyn Reach, MD   6 each at 02/14/20 0615  . docusate sodium (ENEMEEZ) enema 283 mg  1 enema Rectal TID PRN Elwyn Reach, MD      . feeding supplement (NEPRO CARB STEADY) liquid 237 mL  237 mL Oral TID PRN Gala Romney L, MD      . heparin injection 5,000 Units  5,000 Units Subcutaneous Q8H Elwyn Reach, MD   5,000 Units at 02/14/20 0600  . ondansetron (ZOFRAN) tablet 4 mg  4 mg Oral Q6H PRN Elwyn Reach, MD       Or  . ondansetron (ZOFRAN) injection 4 mg  4  mg Intravenous Q6H PRN Garba, Mohammad L, MD      . sodium chloride flush (NS) 0.9 % injection 3 mL  3 mL Intravenous Q12H Gala Romney L, MD   3 mL at 02/14/20 1050  . sodium chloride flush (NS) 0.9 % injection 3 mL  3 mL Intravenous PRN Garba, Mohammad L, MD      . sorbitol 70 % solution 30 mL  30 mL Oral BID PRN Jonelle Sidle, Mohammad L, MD      . zolpidem (AMBIEN) tablet 5 mg  5 mg Oral QHS PRN Elwyn Reach, MD          Allergies: Allergies  Allergen Reactions  . Codeine Other (See Comments)    hyperactive      Past Medical History: Past Medical History:  Diagnosis Date  . Bipolar 1 disorder ([place])   . Cancer ([place]) 20 years   skin  . Gout   . Heart murmur   . Hypertension   . Hypothyroidism   . ITP (idiopathic thrombocytopenic purpura)   . Migraines   . Psoriasis      Past Surgical History: Past Surgical History:  Procedure Laterality Date  . COLONOSCOPY N/A 08/31/2014   Procedure: COLONOSCOPY;  Surgeon: Robert Bellow, MD;  Location: [facility] ENDOSCOPY;  Service: Endoscopy;  Laterality: N/A;  . COLONOSCOPY W/ POLYPECTOMY  09/12/2007   Tubular adenoma from the proximal transverse colon without atypia.  Marland Kitchen [place]  2001  . LITHOTRIPSY    . MELANOMA EXCISION  1998     Family History: Family History  Problem Relation Age of Onset  . Colon cancer Brother        Half brother  . Cancer Father        bladder cancer     Social History: Social History   Socioeconomic History  . Marital status: Married    Spouse name: Not on file  . Number of children: Not on file  . Years of education: Not on file  . Highest education level: Not on file  Occupational History  . Not on file  Tobacco Use  . Smoking status: Former Smoker    Types: Cigarettes  Quit date: 04/09/1966    Years since quitting: 53.8  . Smokeless tobacco: Former Network engineer and Sexual Activity  . Alcohol use: Yes    Alcohol/week: 0.0 standard drinks  . Drug use: No  . Sexual  activity: Not on file  Other Topics Concern  . Not on file  Social History Narrative   Married.   [place] veteran.   Moved back to [place] from [place].    Social Determinants of Health   Financial Resource Strain:   . Difficulty of Paying Living Expenses: Not on file  Food Insecurity:   . Worried About Charity fundraiser in the Last Year: Not on file  . Ran Out of Food in the Last Year: Not on file  Transportation Needs:   . Lack of Transportation (Medical): Not on file  . Lack of Transportation (Non-Medical): Not on file  Physical Activity:   . Days of Exercise per Week: Not on file  . Minutes of Exercise per Session: Not on file  Stress:   . Feeling of Stress : Not on file  Social Connections:   . Frequency of Communication with Friends and Family: Not on file  . Frequency of Social Gatherings with Friends and Family: Not on file  . Attends Religious Services: Not on file  . Active Member of Clubs or Organizations: Not on file  . Attends Archivist Meetings: Not on file  . Marital Status: Not on file  Intimate Partner Violence:   . Fear of Current or Ex-Partner: Not on file  . Emotionally Abused: Not on file  . Physically Abused: Not on file  . Sexually Abused: Not on file     Review of Systems: As per HPI  Vital Signs: Blood pressure (!) 145/100, pulse 91, temperature 98.1 F (36.7 C), temperature source Oral, resp. rate 16, height $RemoveBe'5\' 10"'bFakdvylE$  (1.778 m), weight 95.8 kg, SpO2 94 %.  Weight trends: Filed Weights   02/13/20 1956 02/14/20 0038  Weight: 96.6 kg 95.8 kg    Physical Exam: Physical Exam: General:  No acute distress  Head:  Normocephalic, atraumatic. Moist oral mucosal membranes  Eyes:  Anicteric  Lungs:   Normal effort, able to talk in full sentences, Lungs with crackles +,diminished at the bases  Heart  S1S2 no rubs  Abdomen:   Soft, nontender, bowel sounds present  Extremities:  1+ peripheral edema.  Neurologic:  Awake, alert, following  commands  Skin:  No lesions  Access: Left IJ Permcath    Lab results: Basic Metabolic Panel: Recent Labs  Lab 02/13/20 2003 02/14/20 0710  NA 135 138  K 3.4* 3.5  CL 98 99  CO2 26 30  GLUCOSE 131* 88  BUN 16 22  CREATININE 4.01* 4.84*  CALCIUM 7.8* 8.1*    Liver Function Tests: Recent Labs  Lab 02/13/20 2003 02/14/20 0710  AST 26 21  ALT 10 10  ALKPHOS 94 86  BILITOT 0.9 1.1  PROT 7.0 6.7  ALBUMIN 2.7* 2.7*   No results for input(s): LIPASE, AMYLASE in the last 168 hours. No results for input(s): AMMONIA in the last 168 hours.  CBC: Recent Labs  Lab 02/13/20 2003 02/14/20 0710  WBC 9.5 7.9  NEUTROABS 4.8  --   HGB 7.7* 8.3*  HCT 24.4* 25.8*  MCV 98.4 95.9  PLT 191 176    Cardiac Enzymes: No results for input(s): CKTOTAL, CKMB, CKMBINDEX, TROPONINI in the last 168 hours.  BNP: Invalid input(s): POCBNP  CBG: No  results for input(s): GLUCAP in the last 168 hours.  Microbiology: Results for orders placed or performed during the hospital encounter of 02/13/20  Respiratory Panel by RT PCR (Flu A&B, Covid) - Nasopharyngeal Swab     Status: None   Collection Time: 02/13/20 10:17 PM   Specimen: Nasopharyngeal Swab  Result Value Ref Range Status   SARS Coronavirus 2 by RT PCR NEGATIVE NEGATIVE Final    Comment: (NOTE) SARS-CoV-2 target nucleic acids are NOT DETECTED.  The SARS-CoV-2 RNA is generally detectable in upper respiratoy specimens during the acute phase of infection. The lowest concentration of SARS-CoV-2 viral copies this assay can detect is 131 copies/mL. A negative result does not preclude SARS-Cov-2 infection and should not be used as the sole basis for treatment or other patient management decisions. A negative result may occur with  improper specimen collection/handling, submission of specimen other than nasopharyngeal swab, presence of viral mutation(s) within the areas targeted by this assay, and inadequate number of viral  copies (<131 copies/mL). A negative result must be combined with clinical observations, patient history, and epidemiological information. The expected result is Negative.  Fact Sheet for Patients:  PinkCheek.be  Fact Sheet for Healthcare Providers:  GravelBags.it  This test is no t yet approved or cleared by the [place] FDA and  has been authorized for detection and/or diagnosis of SARS-CoV-2 by FDA under an Emergency Use Authorization (EUA). This EUA will remain  in effect (meaning this test can be used) for the duration of the COVID-19 declaration under Section 564(b)(1) of the Act, 21 U.S.C. section 360bbb-3(b)(1), unless the authorization is terminated or revoked sooner.     Influenza A by PCR NEGATIVE NEGATIVE Final   Influenza B by PCR NEGATIVE NEGATIVE Final    Comment: (NOTE) The Xpert Xpress SARS-CoV-2/FLU/RSV assay is intended as an aid in  the diagnosis of influenza from Nasopharyngeal swab specimens and  should not be used as a sole basis for treatment. Nasal washings and  aspirates are unacceptable for Xpert Xpress SARS-CoV-2/FLU/RSV  testing.  Fact Sheet for Patients: PinkCheek.be  Fact Sheet for Healthcare Providers: GravelBags.it  This test is not yet approved or cleared by the [place] FDA and  has been authorized for detection and/or diagnosis of SARS-CoV-2 by  FDA under an Emergency Use Authorization (EUA). This EUA will remain  in effect (meaning this test can be used) for the duration of the  Covid-19 declaration under Section 564(b)(1) of the Act, 21  U.S.C. section 360bbb-3(b)(1), unless the authorization is  terminated or revoked. Performed at [facility], [address] [place], [place] [address]     Coagulation Studies: No results for input(s): LABPROT, INR in the last 72 hours.  Urinalysis: No results for  input(s): COLORURINE, LABSPEC, PHURINE, GLUCOSEU, HGBUR, BILIRUBINUR, KETONESUR, PROTEINUR, UROBILINOGEN, NITRITE, LEUKOCYTESUR in the last 72 hours.  Invalid input(s): APPERANCEUR    Imaging: DG Chest 2 View  Result Date: 02/13/2020 CLINICAL DATA:  Pleural effusion EXAM: CHEST - 2 VIEW COMPARISON:  05/22/2019 FINDINGS: There is a well-positioned left-sided tunneled dialysis catheter. The patient is status post prior median sternotomy. The cardiac silhouette is enlarged. There are bilateral pleural effusions, moderate to large on the right and small to moderate on the left. Both of these pleural effusions appear to be at least partially loculated. There is adjacent airspace disease favored to represent atelectasis. There is no pneumothorax. IMPRESSION: 1. Bilateral pleural effusions, moderate to large on the right and small to moderate on the  left. Both of these pleural effusions appear to be partially loculated. 2. Cardiomegaly. Electronically Signed   By: Constance Holster M.D.   On: 02/13/2020 20:59      Assessment & Plan: Pt is a 79 y.o. 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   #ESRD on dialysis MWF  Patient received dialysis on Friday Patient has pleural effusions, planning for thoracentesis No acute indication for dialysis today Will continue dialysis MWF   #Bilateral pleural effusions Chest Xray on 02/13/2020 IMPRESSION: 1. Bilateral pleural effusions, moderate to large on the right and small to moderate on the left. Both of these pleural effusions appear to be partially loculated. 2. Cardiomegaly  Planning for thoracentesis tomorrow  #Anemia with CKD Hemoglobin 8.3 Will consider Epogen with dialysis  #Secondary Hyperparathyroidism Calcium 8.1 Will monitor bone mineral metabolism parameters while hospitalized

## 2020-02-14 NOTE — Progress Notes (Signed)
VAST consult for restart PIV. Current right PIV in antecubital infiltrated,site bruised and tissue hard. Removed PIV,applied 2x2 gauze. Patient receiving no IVF/IV medicine. Patient prefers no PIV at this time.Notified primary RN.

## 2020-02-14 NOTE — Progress Notes (Signed)
PT Cancellation Note  Patient Details Name: Dalton Roy MRN: [id number] DOB: [birth year]   Cancelled Treatment:    Reason Eval/Treat Not Completed: Medical issues which prohibited therapy. Patient started on heprain 02/14/20 at 6:00 AM.    Alanson Puls, PT DPT 02/14/2020, 11:13 AM

## 2020-02-14 NOTE — ED Notes (Signed)
Pt taken to floor with RN. Pt on monitor on stretcher.

## 2020-02-14 NOTE — Progress Notes (Signed)
OT Cancellation Note  Patient Details Name: SONIA STICKELS MRN: [id number] DOB: 14-Sep-1940   Cancelled Treatment:    Reason Eval/Treat Not Completed: Patient declined, no reason specified. OT order received and chart reviewed. Upon arrival pt engages in conversation and states he is ready to d/c home not back to SNF. Pt refused mobility stating he was too tired and would be willing after his thoracentesis tomorrow.   Dessie Coma, M.S. OTR/L  02/14/20, 3:26 PM  ascom [phone]

## 2020-02-14 NOTE — Consult Note (Signed)
Cardiology Consultation:   Patient ID: Dalton Roy MRN: [id number]; DOB: 1941/03/13  Admit date: 02/13/2020 Date of Consult: 02/14/2020  Primary Care Provider: [place] Cardiologist: [facility] Electrophysiologist:  None    Patient Profile:   Dalton Roy is a 79 y.o. male with a hx of CAD/CABG , hypertension who is being seen today for the evaluation of shortness of breath at the request of Dr. Priscella Mann.  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  Patient was diagnosed with severe aortic regurgitation being followed closely by the [facility] in [place].  Aortic valve replacement was being planned, work-up via left heart cath revealed significant coronary artery disease.  He underwent CABG plus AVR procedure about 5 weeks ago.  He was subsequently discharged to skilled nursing facility about 5 days ago.  He complains of progressively getting weak and also short of breath prompting him to be brought to the emergency room.  Denies any chest pain  In the ED, troponins were 496, 464, 526.  Chest x-ray showed bilateral pleural effusions moderate to severe on the right, moderate on the left.  Telemetry showing sinus rhythm.  Creatinine 4.0, (baseline 1.47.)   Past Medical History:  Diagnosis Date  . Bipolar 1 disorder ([place])   . Cancer ([place]) 20 years   skin  . Gout   . Heart murmur   . Hypertension   . Hypothyroidism   . ITP (idiopathic thrombocytopenic purpura)   . Migraines   . Psoriasis     Past Surgical History:  Procedure Laterality Date  . COLONOSCOPY N/A 08/31/2014   Procedure: COLONOSCOPY;  Surgeon: Robert Bellow, MD;  Location: [facility] ENDOSCOPY;  Service: Endoscopy;  Laterality: N/A;  . COLONOSCOPY W/ POLYPECTOMY  09/12/2007   Tubular adenoma from the proximal  transverse colon without atypia.  [facility] [place]  2001  . LITHOTRIPSY    . MELANOMA EXCISION  1998     Home Medications:  Prior to Admission medications   Medication Sig Start Date End Date Taking? Authorizing Provider  allopurinol (ZYLOPRIM) 300 MG tablet TAKE 1 TABLET(300 MG) BY MOUTH DAILY Patient taking differently: Take 300 mg by mouth daily. TAKE 1 TABLET(300 MG) BY MOUTH DAILY 09/24/16   Pleas Koch, NP  ammonium lactate (LAC-HYDRIN) 12 % lotion Apply 1 application topically as needed for dry skin.    [provider]  atorvastatin (LIPITOR) 40 MG tablet Take 20 mg by mouth at bedtime.    [provider]  DULoxetine (CYMBALTA) 60 MG capsule Take 60 mg by mouth 2 (two) times daily.    [provider]  fluocinonide ointment (LIDEX) 7.65 % Apply 1 application topically daily as needed (dry skin).    [provider]  fluticasone (FLONASE) 50 MCG/ACT nasal spray Place 1 spray into both nostrils daily.    [provider]  gabapentin (NEURONTIN) 400 MG capsule Take 400 mg by mouth 2 (two) times daily.    [provider]  hydrophilic ointment Apply 1 application topically as needed for dry skin.    [provider]  levothyroxine (SYNTHROID) 50 MCG tablet Take 50 mcg by mouth daily before breakfast.    [provider]  levothyroxine (SYNTHROID, LEVOTHROID) 75 MCG tablet Take 1 tablet (75 mcg total) by mouth daily before breakfast. 10/12/15  Pleas Koch, NP  omeprazole (PRILOSEC) 20 MG capsule Take 20 mg by mouth as needed (acid reflux).     [provider]  Salicylic Acid, Acne, (SALICYLIC ACID EX) Apply 1 application topically at bedtime.    [provider]  sildenafil (VIAGRA) 50 MG tablet Take 25 mg by mouth daily as needed for erectile dysfunction.    [provider]  SUMAtriptan (IMITREX) 100 MG tablet Take 50 mg by mouth every 2 (two) hours as needed for migraine (Pt takes 1  every night at bedtime). May repeat in 2 hours if headache persists or recurs.     [provider]  verapamil (VERELAN PM) 120 MG 24 hr capsule Take 120 mg by mouth daily at 6 (six) AM.     [provider]    Inpatient Medications: Scheduled Meds: . sodium chloride   Intravenous Once  . Chlorhexidine Gluconate Cloth  6 each Topical Q0600  . heparin  5,000 Units Subcutaneous Q8H  . sodium chloride flush  3 mL Intravenous Q12H   Continuous Infusions: . sodium chloride     PRN Meds: sodium chloride, acetaminophen **OR** acetaminophen, calcium carbonate (dosed in mg elemental calcium), camphor-menthol **AND** hydrOXYzine, docusate sodium, feeding supplement (NEPRO CARB STEADY), ondansetron **OR** ondansetron (ZOFRAN) IV, sodium chloride flush, sorbitol, zolpidem  Allergies:    Allergies  Allergen Reactions  . Codeine Other (See Comments)    hyperactive    Social History:   Social History   Socioeconomic History  . Marital status: Married    Spouse name: Not on file  . Number of children: Not on file  . Years of education: Not on file  . Highest education level: Not on file  Occupational History  . Not on file  Tobacco Use  . Smoking status: Former Smoker    Types: Cigarettes    Quit date: 04/09/1966    Years since quitting: 53.8  . Smokeless tobacco: Former Network engineer and Sexual Activity  . Alcohol use: Yes    Alcohol/week: 0.0 standard drinks  . Drug use: No  . Sexual activity: Not on file  Other Topics Concern  . Not on file  Social History Narrative   Married.   [place] veteran.   Moved back to [place] from [place].    Social Determinants of Health   Financial Resource Strain:   . Difficulty of Paying Living Expenses: Not on file  Food Insecurity:   . Worried About Charity fundraiser in the Last Year: Not on file  . Ran Out of Food in the Last Year: Not on file  Transportation Needs:   . Lack of Transportation (Medical): Not on file  .  Lack of Transportation (Non-Medical): Not on file  Physical Activity:   . Days of Exercise per Week: Not on file  . Minutes of Exercise per Session: Not on file  Stress:   . Feeling of Stress : Not on file  Social Connections:   . Frequency of Communication with Friends and Family: Not on file  . Frequency of Social Gatherings with Friends and Family: Not on file  . Attends Religious Services: Not on file  . Active Member of Clubs or Organizations: Not on file  . Attends Archivist Meetings: Not on file  . Marital Status: Not on file  Intimate Partner Violence:   . Fear of Current or Ex-Partner: Not on file  . Emotionally Abused: Not on file  . Physically Abused: Not on file  .  Sexually Abused: Not on file    Family History:    Family History  Problem Relation Age of Onset  . Colon cancer Brother        Half brother  . Cancer Father        bladder cancer     ROS:  Please see the history of present illness.   All other ROS reviewed and negative.     Physical Exam/Data:   Vitals:   02/14/20 0501 02/14/20 0558 02/14/20 0750 02/14/20 1135  BP: 135/86 (!) 130/93 (!) 140/92 (!) 145/100  Pulse: 83 83 85 91  Resp: 18 16    Temp: 98 F (36.7 C) 97.9 F (36.6 C) 98.1 F (36.7 C)   TempSrc:  Oral Oral   SpO2: 98% 92% 93% 94%  Weight:      Height:        Intake/Output Summary (Last 24 hours) at 02/14/2020 1234 Last data filed at 02/14/2020 0548 Gross per 24 hour  Intake 310 ml  Output 0 ml  Net 310 ml   Last 3 Weights 02/14/2020 02/13/2020 06/05/2019  Weight (lbs) 211 lb 4.8 oz 213 lb 214 lb 11.7 oz  Weight (kg) 95.845 kg 96.616 kg 97.4 kg     Body mass index is 30.32 kg/m.  General:  Well nourished, well developed, in no acute distress HEENT: normal Lymph: no adenopathy Neck: no JVD Endocrine:  No thryomegaly Vascular: No carotid bruits; FA pulses 2+ bilaterally without bruits  Cardiac:  normal S1, S2; 2/6 systolic murmur Lungs: Decreased breath sounds  at bases Abd: soft, nontender, no hepatomegaly  Ext: 1+ edema Musculoskeletal:  No deformities, BUE and BLE strength normal and equal Skin: warm and dry  Neuro:  CNs 2-12 intact, no focal abnormalities noted Psych:  Normal affect   EKG:  : Not uploaded Telemetry:  Telemetry was personally reviewed and demonstrates: Sinus rhythm  Relevant CV Studies: Outside echo 12/2019, EF 55%. Repeat echocardiogram pending  Laboratory Data:  High Sensitivity Troponin:   Recent Labs  Lab 02/13/20 2200 02/14/20 0057 02/14/20 0710 02/14/20 0925 02/14/20 1141  TROPONINIHS 394* 496* 464* 526* 509*     Chemistry Recent Labs  Lab 02/13/20 2003 02/14/20 0710  NA 135 138  K 3.4* 3.5  CL 98 99  CO2 26 30  GLUCOSE 131* 88  BUN 16 22  CREATININE 4.01* 4.84*  CALCIUM 7.8* 8.1*  GFRNONAA 15* 12*  ANIONGAP 11 9    Recent Labs  Lab 02/13/20 2003 02/14/20 0710  PROT 7.0 6.7  ALBUMIN 2.7* 2.7*  AST 26 21  ALT 10 10  ALKPHOS 94 86  BILITOT 0.9 1.1   Hematology Recent Labs  Lab 02/13/20 2003 02/14/20 0710  WBC 9.5 7.9  RBC 2.48* 2.69*  HGB 7.7* 8.3*  HCT 24.4* 25.8*  MCV 98.4 95.9  MCH 31.0 30.9  MCHC 31.6 32.2  RDW 15.4 16.1*  PLT 191 176   BNPNo results for input(s): BNP, PROBNP in the last 168 hours.  DDimer No results for input(s): DDIMER in the last 168 hours.   Radiology/Studies:  DG Chest 2 View  Result Date: 02/13/2020 CLINICAL DATA:  Pleural effusion EXAM: CHEST - 2 VIEW COMPARISON:  05/22/2019 FINDINGS: There is a well-positioned left-sided tunneled dialysis catheter. The patient is status post prior median sternotomy. The cardiac silhouette is enlarged. There are bilateral pleural effusions, moderate to large on the right and small to moderate on the left. Both of these pleural effusions appear to be  at least partially loculated. There is adjacent airspace disease favored to represent atelectasis. There is no pneumothorax. IMPRESSION: 1. Bilateral pleural effusions,  moderate to large on the right and small to moderate on the left. Both of these pleural effusions appear to be partially loculated. 2. Cardiomegaly. Electronically Signed   By: Constance Holster M.D.   On: 02/13/2020 20:59     Assessment and Plan:   1.  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  History of CAD/CABG x3 -Elevated troponins likely demand ischemia and not ACS -Aspirin, statin. -Echo  3.  AI s/p AVR -Echo as above  4.  ESRD on HD -HD as per renal  Total encounter time 110 minutes  Greater than 50% was spent in counseling and coordination of care with the patient   Signed, Kate Sable, MD  02/14/2020 12:34 PM

## 2020-02-14 NOTE — Progress Notes (Addendum)
PROGRESS NOTE    Dalton Roy  NKN:[id number] DOB: [birth year] DOA: 02/13/2020 [facility]: [facility]  Brief Narrative:  79 year old male history of coronary disease status post CABG approximately 2 months ago who was discharged to skilled nursing facility now presents to [facility] with complaints of dyspnea.  Patient not requiring supplemental oxygen.  On admission he was found to have bilateral layering pleural effusions right greater than left.  Patient is also recent hemodialysis started as he had a complicated postoperative course and nephrology has been consulted here for hemodialysis  11/7: On my evaluation patient is resting comfortably in bed.  He is able to speak in complete sentences but is notably dyspneic with repeated conversation.  Nephrology and cardiology have been consulted.  Recommendations appreciated.  Plan for thoracentesis tomorrow 11/8   Assessment & Plan:   Principal Problem:   Pleural effusion, right Active Problems:   Essential hypertension   SOB (shortness of breath)   ESRD (end stage renal disease) (HCC)   Hypokalemia   Troponin I above reference range   Symptomatic anemia   Coronary artery disease involving native coronary artery of native heart without angina pectoris  Bilateral pleural effusions, right greater than left Likely postsurgical as well as in the setting of ESRD IR consulted from admission On room air Plan: Image guided thoracentesis, IR consult placed Hold a.m. 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 Unsure when his last dose at SNF was  Suspect it was AM dose on 02/13/20  Aortic insufficiency status post AVR Check 2D echo Follow any further cardiology  recommendations  ESRD on hemodialysis Nephrology consult for hemodialysis  Anemia Acuity unclear Suspect chronic in nature Transfused 1 unit packed red cells Hemoglobin responded appropriately No bleeding noted Continue to monitor      DVT prophylaxis: Subcutaneous heparin Code Status: Full Family Communication: Wife Hassan Rowan [phone] on 02/14/2020 disposition Plan: Status is: Inpatient  Remains inpatient appropriate because:Inpatient level of care appropriate due to severity of illness   Dispo: The patient is from: SNF              Anticipated d/c is to: Home              Anticipated d/c date is: 2 days              Patient currently is not medically stable to d/c.  Patient presented with shortness of breath secondary to bilateral pleural effusions.  Plan for image guided thoracentesis.  Possible discharge within 24 to 48 hours.   Consultants:   Cardiology  Nephrology  Procedures:   None  Antimicrobials:   None   Subjective: Seen and examined.  Endorses shortness of breath with exertion.  No pain complaints.  Objective: Vitals:   02/14/20 0501 02/14/20 0558 02/14/20 0750 02/14/20 1135  BP: 135/86 (!) 130/93 (!) 140/92 (!) 145/100  Pulse: 83 83 85 91  Resp: 18 16    Temp: 98 F (36.7 C) 97.9 F (36.6 C) 98.1 F (36.7 C)   TempSrc:  Oral Oral   SpO2: 98% 92% 93% 94%  Weight:      Height:        Intake/Output Summary (Last 24 hours) at 02/14/2020 1333 Last data filed at 02/14/2020 0548 Gross per 24 hour  Intake  310 ml  Output 0 ml  Net 310 ml   Filed Weights   02/13/20 1956 02/14/20 0038  Weight: 96.6 kg 95.8 kg    Examination:  General exam: Appears calm and comfortable  Respiratory system: Clear to auscultation. Respiratory effort normal. Cardiovascular system: S1 & S2 heard, RRR.  2/6 systolic murmur, 1+ pedal edema bilaterally Gastrointestinal system: Abdomen is nondistended, soft and nontender. No organomegaly or masses felt.  Normal bowel sounds heard. Central nervous system: Alert and oriented. No focal neurological deficits. Extremities: Symmetric 5 x 5 power. Skin: No rashes, lesions or ulcers Psychiatry: Judgement and insight appear normal. Mood & affect appropriate.     Data Reviewed: I have personally reviewed following labs and imaging studies  CBC: Recent Labs  Lab 02/13/20 2003 02/14/20 0710  WBC 9.5 7.9  NEUTROABS 4.8  --   HGB 7.7* 8.3*  HCT 24.4* 25.8*  MCV 98.4 95.9  PLT 191 174   Basic Metabolic Panel: Recent Labs  Lab 02/13/20 2003 02/14/20 0710  NA 135 138  K 3.4* 3.5  CL 98 99  CO2 26 30  GLUCOSE 131* 88  BUN 16 22  CREATININE 4.01* 4.84*  CALCIUM 7.8* 8.1*   GFR: Estimated Creatinine Clearance: 14.6 mL/min (A) (by C-G formula based on SCr of 4.84 mg/dL (H)). Liver Function Tests: Recent Labs  Lab 02/13/20 2003 02/14/20 0710  AST 26 21  ALT 10 10  ALKPHOS 94 86  BILITOT 0.9 1.1  PROT 7.0 6.7  ALBUMIN 2.7* 2.7*   No results for input(s): LIPASE, AMYLASE in the last 168 hours. No results for input(s): AMMONIA in the last 168 hours. Coagulation Profile: No results for input(s): INR, PROTIME in the last 168 hours. Cardiac Enzymes: No results for input(s): CKTOTAL, CKMB, CKMBINDEX, TROPONINI in the last 168 hours. BNP (last 3 results) No results for input(s): PROBNP in the last 8760 hours. HbA1C: No results for input(s): HGBA1C in the last 72 hours. CBG: No results for input(s): GLUCAP in the last 168 hours. Lipid Profile: No results for input(s): CHOL, HDL, LDLCALC, TRIG, CHOLHDL, LDLDIRECT in the last 72 hours. Thyroid Function Tests: No results for input(s): TSH, T4TOTAL, FREET4, T3FREE, THYROIDAB in the last 72 hours. Anemia Panel: No results for input(s): VITAMINB12, FOLATE, FERRITIN, TIBC, IRON, RETICCTPCT in the last 72 hours. Sepsis Labs: No results for input(s): PROCALCITON, LATICACIDVEN in the last 168 hours.  Recent Results (from the past 240  hour(s))  Respiratory Panel by RT PCR (Flu A&B, Covid) - Nasopharyngeal Swab     Status: None   Collection Time: 02/13/20 10:17 PM   Specimen: Nasopharyngeal Swab  Result Value Ref Range Status   SARS Coronavirus 2 by RT PCR NEGATIVE NEGATIVE Final    Comment: (NOTE) SARS-CoV-2 target nucleic acids are NOT DETECTED.  The SARS-CoV-2 RNA is generally detectable in upper respiratoy specimens during the acute phase of infection. The lowest concentration of SARS-CoV-2 viral copies this assay can detect is 131 copies/mL. A negative result does not preclude SARS-Cov-2 infection and should not be used as the sole basis for treatment or other patient management decisions. A negative result may occur with  improper specimen collection/handling, submission of specimen other than nasopharyngeal swab, presence of viral mutation(s) within the areas targeted by this assay, and inadequate number of viral copies (<131 copies/mL). A negative result must be combined with clinical observations, patient history, and epidemiological information. The expected result is Negative.  Fact Sheet for Patients:  PinkCheek.be  Fact Sheet  for Healthcare Providers:  GravelBags.it  This test is no t yet approved or cleared by the [place] and  has been authorized for detection and/or diagnosis of SARS-CoV-2 by FDA under an Emergency Use Authorization (EUA). This EUA will remain  in effect (meaning this test can be used) for the duration of the COVID-19 declaration under Section 564(b)(1) of the Act, 21 U.S.C. section 360bbb-3(b)(1), unless the authorization is terminated or revoked sooner.     Influenza A by PCR NEGATIVE NEGATIVE Final   Influenza B by PCR NEGATIVE NEGATIVE Final    Comment: (NOTE) The Xpert Xpress SARS-CoV-2/FLU/RSV assay is intended as an aid in  the diagnosis of influenza from Nasopharyngeal swab specimens and  should not  be used as a sole basis for treatment. Nasal washings and  aspirates are unacceptable for Xpert Xpress SARS-CoV-2/FLU/RSV  testing.  Fact Sheet for Patients: PinkCheek.be  Fact Sheet for Healthcare Providers: GravelBags.it  This test is not yet approved or cleared by the [place] FDA and  has been authorized for detection and/or diagnosis of SARS-CoV-2 by  FDA under an Emergency Use Authorization (EUA). This EUA will remain  in effect (meaning this test can be used) for the duration of the  Covid-19 declaration under Section 564(b)(1) of the Act, 21  U.S.C. section 360bbb-3(b)(1), unless the authorization is  terminated or revoked. Performed at [facility], [address] [place], [place] [address]          Radiology Studies: DG Chest 2 View  Result Date: 02/13/2020 CLINICAL DATA:  Pleural effusion EXAM: CHEST - 2 VIEW COMPARISON:  05/22/2019 FINDINGS: There is a well-positioned left-sided tunneled dialysis catheter. The patient is status post prior median sternotomy. The cardiac silhouette is enlarged. There are bilateral pleural effusions, moderate to large on the right and small to moderate on the left. Both of these pleural effusions appear to be at least partially loculated. There is adjacent airspace disease favored to represent atelectasis. There is no pneumothorax. IMPRESSION: 1. Bilateral pleural effusions, moderate to large on the right and small to moderate on the left. Both of these pleural effusions appear to be partially loculated. 2. Cardiomegaly. Electronically Signed   By: Constance Holster M.D.   On: 02/13/2020 20:59        Scheduled Meds:  sodium chloride   Intravenous Once   Chlorhexidine Gluconate Cloth  6 each Topical Q0600   heparin  5,000 Units Subcutaneous Q8H   sodium chloride flush  3 mL Intravenous Q12H   Continuous Infusions:  sodium chloride       LOS: 1 day     Time spent: 25 minutes    Sidney Ace, MD [facility] Pager [phone]  If 7PM-7AM, please contact night-coverage 02/14/2020, 1:33 PM

## 2020-02-15 ENCOUNTER — Inpatient Hospital Stay: Payer: No Typology Code available for payment source

## 2020-02-15 ENCOUNTER — Inpatient Hospital Stay (HOSPITAL_COMMUNITY)
Admit: 2020-02-15 | Discharge: 2020-02-15 | Disposition: A | Discharge status: Home / Self Care | Payer: No Typology Code available for payment source | Attending: Cardiology | Admitting: Cardiology

## 2020-02-15 DIAGNOSIS — I359 Nonrheumatic aortic valve disorder, unspecified: Secondary | ICD-10-CM

## 2020-02-15 DIAGNOSIS — R0602 Shortness of breath: Secondary | ICD-10-CM

## 2020-02-15 DIAGNOSIS — J90 Pleural effusion, not elsewhere classified: Secondary | ICD-10-CM | POA: Diagnosis not present

## 2020-02-15 DIAGNOSIS — I2510 Atherosclerotic heart disease of native coronary artery without angina pectoris: Secondary | ICD-10-CM | POA: Diagnosis not present

## 2020-02-15 DIAGNOSIS — I340 Nonrheumatic mitral (valve) insufficiency: Secondary | ICD-10-CM

## 2020-02-15 DIAGNOSIS — I350 Nonrheumatic aortic (valve) stenosis: Secondary | ICD-10-CM

## 2020-02-15 DIAGNOSIS — I361 Nonrheumatic tricuspid (valve) insufficiency: Secondary | ICD-10-CM

## 2020-02-15 LAB — ECHOCARDIOGRAM COMPLETE
AR max vel: 1.41 cm2
AV Area VTI: 1.7 cm2
AV Area mean vel: 1.43 cm2
AV Mean grad: 5.7 mmHg
AV Peak grad: 10.8 mmHg
Ao pk vel: 1.64 m/s
Area-P 1/2: 4.21 cm2
Height: 70 in
S' Lateral: 2.6 cm
Weight: 3386.27 oz

## 2020-02-15 LAB — TYPE AND SCREEN
ABO/RH(D): A POS
Antibody Screen: NEGATIVE
Unit division: 0

## 2020-02-15 LAB — BPAM RBC
Blood Product Expiration Date: 202111212359
ISSUE DATE / TIME: 202111070212
Unit Type and Rh: 6200

## 2020-02-15 MED ORDER — TORSEMIDE 20 MG PO TABS
60.0000 mg | ORAL_TABLET | Freq: Every day | ORAL | Status: DC
  Administered 2020-02-15: 60 mg via ORAL
  Filled 2020-02-15 (×2): qty 3

## 2020-02-15 MED ORDER — PANTOPRAZOLE SODIUM 40 MG PO TBEC
40.0000 mg | DELAYED_RELEASE_TABLET | Freq: Every day | ORAL | Status: DC
  Administered 2020-02-16: 40 mg via ORAL
  Filled 2020-02-15: qty 1

## 2020-02-15 MED ORDER — TRAMADOL HCL 50 MG PO TABS
25.0000 mg | ORAL_TABLET | Freq: Two times a day (BID) | ORAL | Status: DC | PRN
  Administered 2020-02-15 – 2020-02-16 (×2): 25 mg via ORAL
  Filled 2020-02-15 (×2): qty 1

## 2020-02-15 NOTE — Progress Notes (Signed)
Patient refuses to have new IV inserted. He states he wants to wait until his procedure in the AM. Multiple people tried to talk with him - he refuses.

## 2020-02-15 NOTE — Progress Notes (Signed)
*  PRELIMINARY RESULTS* Echocardiogram 2D Echocardiogram has been performed.  Sherrie Sport 02/15/2020, 9:25 AM

## 2020-02-15 NOTE — Progress Notes (Signed)
[facility]  ROUNDING NOTE   Subjective:  Patient back from Rt sided thoracentesis which yielded 500 ml of sanginous fluid removal.He appears sleepy, but arousable to call. He is expressing his desire to go home today.Hospitalist at the bedside.   Objective:  Vital signs in last 24 hours:  Temp:  [97.5 F (36.4 C)-98.2 F (36.8 C)] 97.5 F (36.4 C) (11/08 1512) Pulse Rate:  [80-94] 80 (11/08 1512) Resp:  [18-20] 18 (11/08 1512) BP: (107-153)/(83-105) 107/83 (11/08 1512) SpO2:  [89 %-99 %] 97 % (11/08 1512) Weight:  [96 kg] 96 kg (11/08 0418)  Weight change: -0.616 kg Filed Weights   02/13/20 1956 02/14/20 0038 02/15/20 0418  Weight: 96.6 kg 95.8 kg 96 kg    Intake/Output: I/O last 3 completed shifts: In: 310 [Blood:310] Out: 0    Intake/Output this shift:  No intake/output data recorded.  Physical Exam: General: No acute disttress  Head: Normocephalic, atraumatic. Moist oral mucosal membranes  Eyes: Anicteric  Lungs:  Respirations even,unlabored,Lungs diminished at the bases  Heart: Regular rate and rhythm,S1S2  Abdomen:  Soft, nontender,non distended  Extremities:  1+ peripheral edema.  Neurologic: Nonfocal, moving all four extremities  Skin: No lesions or rashes  Access: Lt IJ catheter    Basic Metabolic Panel: Recent Labs  Lab 02/13/20 2003 02/14/20 0710  NA 135 138  K 3.4* 3.5  CL 98 99  CO2 26 30  GLUCOSE 131* 88  BUN 16 22  CREATININE 4.01* 4.84*  CALCIUM 7.8* 8.1*    Liver Function Tests: Recent Labs  Lab 02/13/20 2003 02/14/20 0710  AST 26 21  ALT 10 10  ALKPHOS 94 86  BILITOT 0.9 1.1  PROT 7.0 6.7  ALBUMIN 2.7* 2.7*   No results for input(s): LIPASE, AMYLASE in the last 168 hours. No results for input(s): AMMONIA in the last 168 hours.  CBC: Recent Labs  Lab 02/13/20 2003 02/14/20 0710  WBC 9.5 7.9  NEUTROABS 4.8  --   HGB 7.7* 8.3*  HCT 24.4* 25.8*  MCV 98.4 95.9  PLT 191 176    Cardiac Enzymes: No  results for input(s): CKTOTAL, CKMB, CKMBINDEX, TROPONINI in the last 168 hours.  BNP: Invalid input(s): POCBNP  CBG: No results for input(s): GLUCAP in the last 168 hours.  Microbiology: Results for orders placed or performed during the hospital encounter of 02/13/20  Respiratory Panel by RT PCR (Flu A&B, Covid) - Nasopharyngeal Swab     Status: None   Collection Time: 02/13/20 10:17 PM   Specimen: Nasopharyngeal Swab  Result Value Ref Range Status   SARS Coronavirus 2 by RT PCR NEGATIVE NEGATIVE Final    Comment: (NOTE) SARS-CoV-2 target nucleic acids are NOT DETECTED.  The SARS-CoV-2 RNA is generally detectable in upper respiratoy specimens during the acute phase of infection. The lowest concentration of SARS-CoV-2 viral copies this assay can detect is 131 copies/mL. A negative result does not preclude SARS-Cov-2 infection and should not be used as the sole basis for treatment or other patient management decisions. A negative result may occur with  improper specimen collection/handling, submission of specimen other than nasopharyngeal swab, presence of viral mutation(s) within the areas targeted by this assay, and inadequate number of viral copies (<131 copies/mL). A negative result must be combined with clinical observations, patient history, and epidemiological information. The expected result is Negative.  Fact Sheet for Patients:  PinkCheek.be  Fact Sheet for Healthcare Providers:  GravelBags.it  This test is no t yet approved or  cleared by the [place] and  has been authorized for detection and/or diagnosis of SARS-CoV-2 by FDA under an Emergency Use Authorization (EUA). This EUA will remain  in effect (meaning this test can be used) for the duration of the COVID-19 declaration under Section 564(b)(1) of the Act, 21 U.S.C. section 360bbb-3(b)(1), unless the authorization is terminated or revoked  sooner.     Influenza A by PCR NEGATIVE NEGATIVE Final   Influenza B by PCR NEGATIVE NEGATIVE Final    Comment: (NOTE) The Xpert Xpress SARS-CoV-2/FLU/RSV assay is intended as an aid in  the diagnosis of influenza from Nasopharyngeal swab specimens and  should not be used as a sole basis for treatment. Nasal washings and  aspirates are unacceptable for Xpert Xpress SARS-CoV-2/FLU/RSV  testing.  Fact Sheet for Patients: PinkCheek.be  Fact Sheet for Healthcare Providers: GravelBags.it  This test is not yet approved or cleared by the [place] FDA and  has been authorized for detection and/or diagnosis of SARS-CoV-2 by  FDA under an Emergency Use Authorization (EUA). This EUA will remain  in effect (meaning this test can be used) for the duration of the  Covid-19 declaration under Section 564(b)(1) of the Act, 21  U.S.C. section 360bbb-3(b)(1), unless the authorization is  terminated or revoked. Performed at [facility], [address] [place], [place] [address]     Coagulation Studies: No results for input(s): LABPROT, INR in the last 72 hours.  Urinalysis: No results for input(s): COLORURINE, LABSPEC, PHURINE, GLUCOSEU, HGBUR, BILIRUBINUR, KETONESUR, PROTEINUR, UROBILINOGEN, NITRITE, LEUKOCYTESUR in the last 72 hours.  Invalid input(s): APPERANCEUR    Imaging: DG Chest 2 View  Result Date: 02/13/2020 CLINICAL DATA:  Pleural effusion EXAM: CHEST - 2 VIEW COMPARISON:  05/22/2019 FINDINGS: There is a well-positioned left-sided tunneled dialysis catheter. The patient is status post prior median sternotomy. The cardiac silhouette is enlarged. There are bilateral pleural effusions, moderate to large on the right and small to moderate on the left. Both of these pleural effusions appear to be at least partially loculated. There is adjacent airspace disease favored to represent atelectasis. There is no  pneumothorax. IMPRESSION: 1. Bilateral pleural effusions, moderate to large on the right and small to moderate on the left. Both of these pleural effusions appear to be partially loculated. 2. Cardiomegaly. Electronically Signed   By: Constance Holster M.D.   On: 02/13/2020 20:59   DG Chest Port 1 View  Result Date: 02/15/2020 CLINICAL DATA:  Patient status post right thoracentesis today. EXAM: PORTABLE CHEST 1 VIEW COMPARISON:  PA and lateral chest 02/13/2020. FINDINGS: Right pleural effusion is decreased after thoracentesis. No pneumothorax. Small left effusion is unchanged. There is cardiomegaly and vascular congestion. Aortic atherosclerosis is noted. The patient is status post CABG and aortic valve replacement. Dialysis catheter is in place. IMPRESSION: Decreased right effusion after thoracentesis. Negative for pneumothorax. No change in a small left pleural effusion and basilar atelectasis. Cardiomegaly and vascular congestion. Electronically Signed   By: Inge Rise M.D.   On: 02/15/2020 11:24   ECHOCARDIOGRAM COMPLETE  Result Date: 02/15/2020    ECHOCARDIOGRAM REPORT   Patient Name:   Dalton Roy Date of Exam: 02/15/2020 Medical Rec #:  [id number]      Height:       70.0 in Accession #:    [id number]     Weight:       211.6 lb Date of Birth:  08-Dec-1940      BSA:  2.138 m Patient Age:    79 years       BP:           153/95 mmHg Patient Gender: M              HR:           93 bpm. Exam Location:  [facility] Procedure: 2D Echo, Cardiac Doppler and Color Doppler Indications:     Dyspnea 786.09  History:         Patient has prior history of Echocardiogram examinations, most                  recent 05/22/2019. Prior CABG, Signs/Symptoms:Murmur; Risk                  Factors:Hypertension. Bipolar disorder 1.  Sonographer:     Sherrie Sport RDCS (AE) Referring Phys:  [id number] Kate Sable Diagnosing Phys: Kathlyn Sacramento MD IMPRESSIONS  1. Left ventricular ejection fraction, by estimation, is 55 to  60%. The left ventricle has normal function. Left ventricular endocardial border not optimally defined to evaluate regional wall motion. There is moderate left ventricular hypertrophy. Left ventricular diastolic parameters are indeterminate.  2. Right ventricular systolic function is normal. The right ventricular size is normal. There is mildly elevated pulmonary artery systolic pressure.  3. Left atrial size was mildly dilated.  4. Right atrial size was mildly dilated.  5. Moderate pericardial effusion. The pericardial effusion is posterior to the left ventricle.  6. The mitral valve is normal in structure. Mild mitral valve regurgitation. No evidence of mitral stenosis.  7. The aortic valve was not well visualized. Aortic valve regurgitation is not visualized. Mild aortic valve stenosis. Aortic valve area, by VTI measures 1.70 cm. Aortic valve mean gradient measures 5.7 mmHg.  8. The inferior vena cava is dilated in size with >50% respiratory variability, suggesting right atrial pressure of 8 mmHg.  9. challening image quality. FINDINGS  Left Ventricle: Left ventricular ejection fraction, by estimation, is 55 to 60%. The left ventricle has normal function. Left ventricular endocardial border not optimally defined to evaluate regional wall motion. The left ventricular internal cavity size was normal in size. There is moderate left ventricular hypertrophy. Left ventricular diastolic parameters are indeterminate. Right Ventricle: The right ventricular size is normal. No increase in right ventricular wall thickness. Right ventricular systolic function is normal. There is mildly elevated pulmonary artery systolic pressure. The tricuspid regurgitant velocity is 2.89  m/s, and with an assumed right atrial pressure of 8 mmHg, the estimated right ventricular systolic pressure is 63.0 mmHg. Left Atrium: Left atrial size was mildly dilated. Right Atrium: Right atrial size was mildly dilated. Pericardium: A moderately sized  pericardial effusion is present. The pericardial effusion is posterior to the left ventricle. Mitral Valve: The mitral valve is normal in structure. Mild mitral valve regurgitation. No evidence of mitral valve stenosis. Tricuspid Valve: The tricuspid valve is normal in structure. Tricuspid valve regurgitation is mild . No evidence of tricuspid stenosis. Aortic Valve: The aortic valve was not well visualized. Aortic valve regurgitation is not visualized. Mild aortic stenosis is present. Aortic valve mean gradient measures 5.7 mmHg. Aortic valve peak gradient measures 10.8 mmHg. Aortic valve area, by VTI measures 1.70 cm. Pulmonic Valve: The pulmonic valve was normal in structure. Pulmonic valve regurgitation is not visualized. No evidence of pulmonic stenosis. Aorta: The aortic root is normal in size and structure. Venous: The inferior vena cava is dilated in size with greater than  50% respiratory variability, suggesting right atrial pressure of 8 mmHg. IAS/Shunts: No atrial level shunt detected by color flow Doppler.  LEFT VENTRICLE PLAX 2D LVIDd:         3.53 cm  Diastology LVIDs:         2.60 cm  LV e' medial:    12.10 cm/s LV PW:         1.42 cm  LV E/e' medial:  7.8 LV IVS:        1.21 cm  LV e' lateral:   5.87 cm/s LVOT diam:     2.00 cm  LV E/e' lateral: 16.1 LV SV:         36 LV SV Index:   17 LVOT Area:     3.14 cm  RIGHT VENTRICLE RV Basal diam:  3.81 cm RV S prime:     11.60 cm/s LEFT ATRIUM             Index       RIGHT ATRIUM           Index LA diam:        4.80 cm 2.24 cm/m  RA Area:     20.70 cm LA Vol (A2C):   67.4 ml 31.52 ml/m RA Volume:   51.40 ml  24.04 ml/m LA Vol (A4C):   76.9 ml 35.97 ml/m LA Biplane Vol: 73.9 ml 34.56 ml/m  AORTIC VALVE                    PULMONIC VALVE AV Area (Vmax):    1.41 cm     PV Vmax:        1.00 m/s AV Area (Vmean):   1.43 cm     PV Peak grad:   4.0 mmHg AV Area (VTI):     1.70 cm     RVOT Peak grad: 5 mmHg AV Vmax:           164.33 cm/s AV Vmean:           110.333 cm/s AV VTI:            0.211 m AV Peak Grad:      10.8 mmHg AV Mean Grad:      5.7 mmHg LVOT Vmax:         73.80 cm/s LVOT Vmean:        50.300 cm/s LVOT VTI:          0.114 m LVOT/AV VTI ratio: 0.54  AORTA Ao Root diam: 3.00 cm MITRAL VALVE               TRICUSPID VALVE MV Area (PHT): 4.21 cm    TR Peak grad:   33.4 mmHg MV Decel Time: 180 msec    TR Vmax:        289.00 cm/s MV E velocity: 94.30 cm/s MV A velocity: 94.30 cm/s  SHUNTS MV E/A ratio:  1.00        Systemic VTI:  0.11 m                            Systemic Diam: 2.00 cm Kathlyn Sacramento MD Electronically signed by Kathlyn Sacramento MD Signature Date/Time: 02/15/2020/2:24:49 PM    Final    US THORACENTESIS ASP PLEURAL SPACE W/IMG GUIDE  Result Date: 02/15/2020 INDICATION: Patient history of coronary artery disease status post CABG with dyspnea found to have a bilateral pleural effusions. Request is for therapeutic  right-sided thoracentesis EXAM: ULTRASOUND GUIDED THERAPEUTIC THORACENTESIS MEDICATIONS: Lidocaine 1% 10 mL COMPLICATIONS: None immediate. PROCEDURE: An ultrasound guided thoracentesis was thoroughly discussed with the patient and questions answered. The benefits, risks, alternatives and complications were also discussed. The patient understands and wishes to proceed with the procedure. Written consent was obtained. Ultrasound performed showed a loculated right-sided pleural effusion. The largest pocket was localized and marked in the right sided chest. The area was then prepped and draped in the normal sterile fashion. 1% Lidocaine was used for local anesthesia. Under ultrasound guidance a 6 Fr Safe-T-Centesis catheter was introduced. Thoracentesis was performed. The catheter was removed and a dressing applied. FINDINGS: A total of approximately 500 mL of sanguineous fluid was removed. IMPRESSION: Successful ultrasound guided right-sided therapeutic thoracentesis yielding 500 mL of pleural fluid. Read by: Rushie Nyhan, NP  Electronically Signed   By: Lucrezia Europe M.D.   On: 02/15/2020 11:37     Medications:   . sodium chloride     . sodium chloride   Intravenous Once  . allopurinol  100 mg Oral Once per day on Mon Wed Fri  . aspirin EC  81 mg Oral QHS  . atorvastatin  20 mg Oral QHS  . Chlorhexidine Gluconate Cloth  6 each Topical Q0600  . escitalopram  5 mg Oral Daily  . ferrous sulfate  325 mg Oral QHS  . gabapentin  200 mg Oral Q breakfast  . gabapentin  300 mg Oral Q supper  . levothyroxine  75 mcg Oral Q0600  . melatonin  5 mg Oral QHS  . metoprolol tartrate  12.5 mg Oral BID  . pantoprazole  40 mg Oral Daily  . sodium chloride flush  3 mL Intravenous Q12H  . torsemide  60 mg Oral Daily  . traZODone  50 mg Oral QHS   sodium chloride, acetaminophen **OR** acetaminophen, calcium carbonate (dosed in mg elemental calcium), camphor-menthol **AND** hydrOXYzine, docusate sodium, feeding supplement (NEPRO CARB STEADY), ondansetron **OR** ondansetron (ZOFRAN) IV, sodium chloride flush, sorbitol, zolpidem  Assessment/ Plan:  Mr. GAMAL TODISCO is a 79 y.o.  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 #ESRD on dialysis TTS #Volume overload Confirmed with the patient regarding his dialysis schedule as we had some confusion yesterday Patient received dialysis on Saturday He is on TTS schedule as outpatient Will resume dialysis at his outpatient center tomorrow We will add Torsemide today   #Bilateral pleural effusions Chest Xray on 02/13/2020 IMPRESSION: 1. Bilateral pleural effusions, moderate to large on the right and small to moderate on the left. Both of these pleural effusions appear to be partially loculated. 2. Cardiomegaly  Rt Thoracentesis today  with 500 ml of sanguinous fluid Planning follow up CT scan In no acute respiratory distress, on supplemental O2 2L via nasal canula  #Anemia with CKD Hemoglobin 8.3 on 02/14/2020 Continue Epogen per outpatient dialysis protocol  #Secondary Hyperparathyroidism Monitor bone mineral metabolism parameters as outpatient.   LOS: 2 Larosa Rhines 11/8/20213:37 PM

## 2020-02-15 NOTE — Procedures (Signed)
Ultrasound-guided therapeutic right sided thoracentesis performed yielding 500 mililiters of sanguinous colored fluid. No immediate complications. Follow-up chest x-ray pending. EBL is < 2 ml.

## 2020-02-15 NOTE — Evaluation (Signed)
Physical Therapy Evaluation Patient Details Name: Dalton Roy MRN: [id number] DOB: [birth year] Today's Date: 02/15/2020   History of Present Illness  Pt is a 79 y/o M admitted on 02/13/20 via EMS from local nursing home due to pleural effusion & family wanted pt examined 2/2 SOB. Pt underwent thoracentesis on 02/15/20.  PMH: 3 vessel CABG ~6 weeks ago (01/2020), ESRD on HD, bipolar 1 disorder, skin CA, heart murmur, HTN, migraines  Clinical Impression  Pt disgruntled throughout session requiring significant encouragement & education for participation. Pt requires significant assistance for supine>sit but reports he will sleep in recliner at home. Pt able to complete transfers & a couple steps at EOB with little assist but anticipate pt will need some physical assist or AD to safely ambulate & negotiate threshold step into home. Oxygen assessed throughout session as pt reports he doesn't use supplemental oxygen at home.  Oxygen : Lying: SpO2 94% on 3L [place], desat 90% on RA Sitting: SpO2 87% RA, resolved to 90% c PLB Standing: SpO2 91% on RA, pt tolerated ~2 mins standing  Pt aware of sternal precautions but not open to education re: compensatory strategies to increase independence with mobility at this time.  Recommending HHPT f/u to address gait & balance deficits.    Follow Up Recommendations Home health PT;Supervision/Assistance - 24 hour    Equipment Recommendations  Rolling walker with 5" wheels    Recommendations for Other Services       Precautions / Restrictions Precautions Precautions: Fall;Sternal Precaution Comments: CABG ~5 weeks ago Restrictions Weight Bearing Restrictions: No      Mobility  Bed Mobility Overal bed mobility: Needs Assistance Bed Mobility: Supine to Sit;Sit to Supine     Supine to sit: Max assist;+2 for physical assistance;HOB elevated Sit to supine: Supervision   General bed mobility comments: For supine>sit pt transfers BLE off EOB but then  makes no attempts to upright trunk & instead states therapists have to lift him (pt unwilling to be educated on compensatory technique to maintain sternal precautions). Pt requires max assist +2 to upright trunk to sitting EOB.    Transfers Overall transfer level: Needs assistance   Transfers: Sit to/from Stand Sit to Stand: Supervision         General transfer comment: SBA sit<>stand and 2 side steps at EOB  Ambulation/Gait                Stairs            Wheelchair Mobility    Modified Rankin (Stroke Patients Only)       Balance Overall balance assessment: Needs assistance Sitting-balance support: No upper extremity supported;Feet supported Sitting balance-Leahy Scale: Good     Standing balance support: No upper extremity supported Standing balance-Leahy Scale: Fair Standing balance comment: Anticipate assist needed for dynamic standing                             Pertinent Vitals/Pain Pain Assessment: Faces Faces Pain Scale: Hurts a little bit Pain Location: chest  Pain Descriptors / Indicators: Discomfort;Dull Pain Intervention(s): Limited activity within patient's tolerance    Home Living Family/patient expects to be discharged to:: Private residence Living Arrangements: Spouse/significant other Available Help at Discharge: Family Type of Home: House (townhome) Home Access: Stairs to enter Entrance Stairs-Rails: None Entrance Stairs-Number of Steps: threshold step Home Layout: Two level;Able to live on main level with bedroom/bathroom Home Equipment: Shower seat;Grab bars - tub/shower;Walker -  2 wheels      Prior Function Level of Independence: Needs assistance   Gait / Transfers Assistance Needed: Pt d/c from SNF following CABG ~5 weeks ago. Pt reports d/c from PT at [facility]. Pt reports sleeping in recliner lift chair. Pt reports walking 500 miles last year.            Hand Dominance   Dominant Hand: Right     Extremity/Trunk Assessment   Upper Extremity Assessment Upper Extremity Assessment: Generalized weakness    Lower Extremity Assessment Lower Extremity Assessment: Generalized weakness       Communication   Communication: No difficulties  Cognition Arousal/Alertness: Awake/alert Behavior During Therapy:  (disgruntled) Overall Cognitive Status: Within Functional Limits for tasks assessed                                 General Comments: Pt is disgruntled and takes significant convincing to participate in therapy      General Comments     Exercises Other Exercises Other Exercises: Pt and wife educated re: OT role, DME recs, d/c recs, falls prevention, ECS Other Exercises: sup<>sit, sit<>stand, sitting/standing balance/tolerance   Assessment/Plan    PT Assessment Patient needs continued PT services  PT Problem List Decreased strength;Decreased balance;Decreased knowledge of precautions;Decreased range of motion;Decreased mobility;Decreased knowledge of use of DME;Cardiopulmonary status limiting activity;Decreased safety awareness;Decreased activity tolerance       PT Treatment Interventions DME instruction;Functional mobility training;Balance training;Patient/family education;Neuromuscular re-education;Therapeutic activities;Gait training;Stair training;Therapeutic exercise;Manual techniques    PT Goals (Current goals can be found in the Care Plan section)  Acute Rehab PT Goals Patient Stated Goal: to catch up on his sleep PT Goal Formulation: With patient Time For Goal Achievement: 02/29/20 Potential to Achieve Goals: Good    Frequency Min 2X/week   Barriers to discharge        Co-evaluation PT/OT/SLP Co-Evaluation/Treatment: Yes Reason for Co-Treatment: To address functional/ADL transfers;Necessary to address cognition/behavior during functional activity PT goals addressed during session: Mobility/safety with mobility;Balance OT goals addressed  during session: ADL's and self-care       AM-PAC PT "6 Clicks" Mobility  Outcome Measure Help needed turning from your back to your side while in a flat bed without using bedrails?: A Lot Help needed moving from lying on your back to sitting on the side of a flat bed without using bedrails?: A Lot Help needed moving to and from a bed to a chair (including a wheelchair)?: A Little Help needed standing up from a chair using your arms (e.g., wheelchair or bedside chair)?: None Help needed to walk in hospital room?: A Little Help needed climbing 3-5 steps with a railing? : A Little 6 Click Score: 17    End of Session Equipment Utilized During Treatment: Gait belt;Oxygen Activity Tolerance: Patient tolerated treatment well (Pt limited by decreased motivation and wants to "catch up on his sleep") Patient left: in bed;with call bell/phone within reach;with family/visitor present;with bed alarm set   PT Visit Diagnosis: Unsteadiness on feet (R26.81);Muscle weakness (generalized) (M62.81)    Time: 1165-7903 PT Time Calculation (min) (ACUTE ONLY): 21 min   Charges:   PT Evaluation $PT Eval Low Complexity: [place], PT, DPT 02/15/20, 4:17 PM   Waunita Schooner 02/15/2020, 4:15 PM

## 2020-02-15 NOTE — Clinical Social Work Note (Addendum)
Per MD, patient does not want to return to SNF. [place] representative received insurance authorization from the [place] this morning for PT, OT, RN, SW. He will follow up with patient to let him know.  Dayton Scrape, [place]  3:38 pm CSW called the [place] transportation service [phone] ext [phone]) and left a voicemail to see if they could take him home today and to let them know that pick up for HD tomorrow will need to be switched to his home address rather than [facility]. CSW confirmed with patient and his wife that the address on the facesheet is correct.  Dayton Scrape, [place]

## 2020-02-15 NOTE — Evaluation (Signed)
Occupational Therapy Evaluation Patient Details Name: Dalton Roy MRN: [id number] DOB: [birth year] Today's Date: 02/15/2020    History of Present Illness Patient is a 79 y.o. male with a PMHx of  Recent CABG 5 weeks ago, complicated with ESRD,currently on HD MWF schedule, HTN, bipolar disorder, who was admitted to [facility] on 02/13/2020 for evaluation of worsening SOB. Patient found to have bilateral pleural effusions. S/p thoracentesis 02/15/20.   Clinical Impression   Dalton Roy was seen for OT/PT co-evaluation this date. Prior to hospital admission, pt was at [facility] following CABG ~5 weeks ago. Pt reports d/c from PT at SNF Pt lives c wife in town home c handicap accessible bed/bath on main level. Wife reports son will stay initially with them. Pt presents to acute OT demonstrating impaired ADL performance and functional mobility 2/2 decreased activity tolerance, functional strength/ROM/balance deficits, and poor insight into deficits. Pt appears disgruntled and takes significant convincing to participate in therapy. Pt currently requires MOD A x2 sup>sit - assist for trunk elevation. Pt is aware of sternal precautions however refuses to attempt log rolling. States he will not have to get out of bed at home as he has a lift chair. SBA sit<>stand and 2 side steps at EOB. SUPERVISION sit>sup. Pt would benefit from skilled OT to address noted impairments and functional limitations (see below for any additional details) in order to maximize safety and independence while minimizing falls risk and caregiver burden. Upon hospital discharge, recommend HHOT to maximize pt safety and return to functional independence during meaningful occupations of daily life.  Oxygen  Lying: SpO2 94% on 3L [place], desat 90% on RA Sitting: SpO2 97% RA, resolved to 90% c PLB Standing: SpO2 91% on RA, pt tolerated ~2 mins standing      Follow Up Recommendations  Home health OT;Supervision/Assistance - 24 hour    Equipment  Recommendations  None recommended by OT    Recommendations for Other Services       Precautions / Restrictions Precautions Precautions: Fall;Sternal Precaution Comments: CABG ~5 weeks ago Restrictions Weight Bearing Restrictions: No      Mobility Bed Mobility Overal bed mobility: Needs Assistance Bed Mobility: Supine to Sit;Sit to Supine     Supine to sit: Mod assist;+2 for physical assistance;HOB elevated Sit to supine: Supervision   General bed mobility comments: MOD A x2 sup>sit - assist for trunk elevation. Pt is aware of sternal precautions however refuses to attempt log rolling.     Transfers Overall transfer level: Needs assistance   Transfers: Sit to/from Stand Sit to Stand: Supervision    General transfer comment: SBA sit<>stand and 2 side steps at EOB    Balance Overall balance assessment: Needs assistance Sitting-balance support: No upper extremity supported;Feet supported Sitting balance-Leahy Scale: Good     Standing balance support: No upper extremity supported Standing balance-Leahy Scale: Fair Standing balance comment: Anticipate assit needed for dynamic standing        ADL either performed or assessed with clinical judgement   ADL Overall ADL's : Needs assistance/impaired      General ADL Comments: MOD A for LB access. MOD I seated UB ADLs. CGA for ADL t/f                   Pertinent Vitals/Pain Pain Assessment: Faces Faces Pain Scale: Hurts a little bit Pain Location: chest  Pain Descriptors / Indicators: Discomfort;Dull Pain Intervention(s): Limited activity within patient's tolerance     Hand Dominance Right   Extremity/Trunk Assessment  Upper Extremity Assessment Upper Extremity Assessment: Generalized weakness   Lower Extremity Assessment Lower Extremity Assessment: Generalized weakness       Communication Communication Communication: No difficulties   Cognition Arousal/Alertness: Awake/alert Behavior During  Therapy:  (Disgruntled ) Overall Cognitive Status: Within Functional Limits for tasks assessed        General Comments: Pt is disgruntled and takes significant convincing to participate in therapy   General Comments     Exercises Exercises: Other exercises Other Exercises Other Exercises: Pt and wife educated re: OT role, DME recs, d/c recs, falls prevention, ECS Other Exercises: sup<>sit, sit<>stand, sitting/standing balance/tolerance   Shoulder Instructions      Home Living Family/patient expects to be discharged to:: Private residence Living Arrangements: Spouse/significant other Available Help at Discharge: Family Type of Home: House (townhome) Home Access: Stairs to enter Technical brewer of Steps: threshold step Entrance Stairs-Rails: None Home Layout: Two level;Able to live on main level with bedroom/bathroom (wife's office only upstairs)     Bathroom Shower/Tub: Occupational psychologist: Handicapped height Bathroom Accessibility: Yes   Home Equipment: Shower seat;Grab bars - tub/shower;Walker - 2 wheels          Prior Functioning/Environment Level of Independence: Needs assistance  Gait / Transfers Assistance Needed: Pt d/c from SNF following CABG ~5 weeks ago. Pt reports d/c from PT at [facility]. Pt reports sleeping in recliner lift chair. Pt reports walking 500 miles last year.               OT Problem List: Decreased strength;Decreased range of motion;Decreased activity tolerance;Impaired balance (sitting and/or standing)      OT Treatment/Interventions: Self-care/ADL training;Therapeutic exercise;Energy conservation;DME and/or AE instruction;Therapeutic activities;Patient/family education;Balance training    OT Goals(Current goals can be found in the care plan section) Acute Rehab OT Goals Patient Stated Goal: to go home OT Goal Formulation: With patient/family Time For Goal Achievement: 02/29/20 Potential to Achieve Goals: Good ADL  Goals Pt Will Perform Grooming: Independently;standing Pt Will Perform Lower Body Dressing: Independently;sit to/from stand Pt Will Transfer to Toilet: Independently;ambulating;bedside commode  OT Frequency: Min 1X/week           Co-evaluation PT/OT/SLP Co-Evaluation/Treatment: Yes Reason for Co-Treatment: Necessary to address cognition/behavior during functional activity;To address functional/ADL transfers PT goals addressed during session: Mobility/safety with mobility;Balance OT goals addressed during session: ADL's and self-care      AM-PAC OT "6 Clicks" Daily Activity     Outcome Measure Help from another person eating meals?: None Help from another person taking care of personal grooming?: None Help from another person toileting, which includes using toliet, bedpan, or urinal?: A Little Help from another person bathing (including washing, rinsing, drying)?: A Little Help from another person to put on and taking off regular upper body clothing?: None Help from another person to put on and taking off regular lower body clothing?: A Little 6 Click Score: 21   End of Session Equipment Utilized During Treatment: Gait belt;Oxygen (3L [place])  Activity Tolerance: Patient limited by fatigue Patient left: in bed;with call bell/phone within reach;with bed alarm set;with family/visitor present  OT Visit Diagnosis: Other abnormalities of gait and mobility (R26.89)                Time: 9476-5465 OT Time Calculation (min): 21 min Charges:  OT General Charges $OT Visit: 1 Visit OT Evaluation $OT Eval Moderate Complexity: 1 Mod  Dessie Coma, M.S. OTR/L  02/15/20, 3:04 PM  ascom [phone]

## 2020-02-15 NOTE — Progress Notes (Signed)
PROGRESS NOTE    Dalton Roy  QXI:[id number] DOB: [birth year] DOA: 02/13/2020 [facility]: [facility]  Brief Narrative:  79 year old male history of coronary disease status post CABG approximately 2 months ago who was discharged to skilled nursing facility now presents to [facility] with complaints of dyspnea.  Patient not requiring supplemental oxygen.  On admission he was found to have bilateral layering pleural effusions right greater than left.  Patient is also recent hemodialysis started as he had a complicated postoperative course and nephrology has been consulted here for hemodialysis  11/7: On my evaluation patient is resting comfortably in bed.  He is able to speak in complete sentences but is notably dyspneic with repeated conversation.  Nephrology and cardiology have been consulted.  Recommendations appreciated.  Plan for thoracentesis tomorrow 11/8  11/8: Patient remained stable overnight.  Continues to have some dyspnea on exertion.  Thoracentesis planned for today.  Nephrology and cardiology on consult.   Assessment & Plan:   Principal Problem:   Pleural effusion, right Active Problems:   Essential hypertension   SOB (shortness of breath)   ESRD (end stage renal disease) (HCC)   Hypokalemia   Troponin I above reference range   Symptomatic anemia   Coronary artery disease involving native coronary artery of native heart without angina pectoris  Bilateral pleural effusions, right greater than left Likely postsurgical as well as in the setting of ESRD IR consulted from admission On room air Plan: Image guided thoracentesis, IR consult placed.  Plan for today Hold a.m. 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 On hold for thoracentesis Restart as appropriate post procedurally  Aortic insufficiency status post AVR Check 2D echo Follow any further cardiology recommendations  ESRD on hemodialysis Nephrology consult for hemodialysis  Anemia Acuity unclear Suspect chronic in nature Transfused 1 unit packed red cells Hemoglobin responded appropriately No bleeding noted Continue to monitor      DVT prophylaxis: Subcutaneous heparin Code Status: Full Family Communication: Wife Hassan Rowan [phone] bedside on 02/15/2020 disposition Plan: Status is: Inpatient  Remains inpatient appropriate because:Inpatient level of care appropriate due to severity of illness   Dispo: The patient is from: Home              Anticipated d/c is to: Home              Anticipated d/c date is: 1 day              Patient currently is not medically stable to d/c.   Image guided thoracentesis today.  Nephrology following for inpatient hemodialysis needs.  Anticipate discharge home with home health within the next 24 hours.   Consultants:   Cardiology  Nephrology  IR  Procedures:   None  Antimicrobials:   None   Subjective: Seen and examined.  Endorses some reflux type symptoms this morning.  Objective: Vitals:   02/15/20 0452 02/15/20 0758 02/15/20 0900 02/15/20 1137  BP: (!) 139/105 (!) 153/95 (!) 148/95 (!) 131/92  Pulse: 91 93  94  Resp: $Remo'20 18  18  'XxSMR$ Temp: (!) 97.5 F (36.4 C) (!) 97.5 F (36.4 C)  98.1 F (36.7 C)  TempSrc: Oral Oral  Oral  SpO2: 96% 97% 97% 99%  Weight:      Height:  Intake/Output Summary (Last 24 hours) at 02/15/2020 1303 Last data filed at 02/15/2020 0504 Gross per 24 hour  Intake --  Output 0 ml  Net 0 ml   Filed Weights   02/13/20 1956 02/14/20 0038 02/15/20 0418  Weight: 96.6 kg 95.8 kg 96 kg    Examination:  General exam: Appears calm and comfortable  Respiratory system: Clear to auscultation. Respiratory effort  normal. Cardiovascular system: S1 & S2 heard, RRR.  2/6 systolic murmur, 1+ pedal edema bilaterally Gastrointestinal system: Abdomen is nondistended, soft and nontender. No organomegaly or masses felt. Normal bowel sounds heard. Central nervous system: Alert and oriented. No focal neurological deficits. Extremities: Symmetric 5 x 5 power. Skin: No rashes, lesions or ulcers Psychiatry: Judgement and insight appear normal. Mood & affect appropriate.     Data Reviewed: I have personally reviewed following labs and imaging studies  CBC: Recent Labs  Lab 02/13/20 2003 02/14/20 0710  WBC 9.5 7.9  NEUTROABS 4.8  --   HGB 7.7* 8.3*  HCT 24.4* 25.8*  MCV 98.4 95.9  PLT 191 270   Basic Metabolic Panel: Recent Labs  Lab 02/13/20 2003 02/14/20 0710  NA 135 138  K 3.4* 3.5  CL 98 99  CO2 26 30  GLUCOSE 131* 88  BUN 16 22  CREATININE 4.01* 4.84*  CALCIUM 7.8* 8.1*   GFR: Estimated Creatinine Clearance: 14.6 mL/min (A) (by C-G formula based on SCr of 4.84 mg/dL (H)). Liver Function Tests: Recent Labs  Lab 02/13/20 2003 02/14/20 0710  AST 26 21  ALT 10 10  ALKPHOS 94 86  BILITOT 0.9 1.1  PROT 7.0 6.7  ALBUMIN 2.7* 2.7*   No results for input(s): LIPASE, AMYLASE in the last 168 hours. No results for input(s): AMMONIA in the last 168 hours. Coagulation Profile: No results for input(s): INR, PROTIME in the last 168 hours. Cardiac Enzymes: No results for input(s): CKTOTAL, CKMB, CKMBINDEX, TROPONINI in the last 168 hours. BNP (last 3 results) No results for input(s): PROBNP in the last 8760 hours. HbA1C: No results for input(s): HGBA1C in the last 72 hours. CBG: No results for input(s): GLUCAP in the last 168 hours. Lipid Profile: No results for input(s): CHOL, HDL, LDLCALC, TRIG, CHOLHDL, LDLDIRECT in the last 72 hours. Thyroid Function Tests: No results for input(s): TSH, T4TOTAL, FREET4, T3FREE, THYROIDAB in the last 72 hours. Anemia Panel: No results for input(s):  VITAMINB12, FOLATE, FERRITIN, TIBC, IRON, RETICCTPCT in the last 72 hours. Sepsis Labs: No results for input(s): PROCALCITON, LATICACIDVEN in the last 168 hours.  Recent Results (from the past 240 hour(s))  Respiratory Panel by RT PCR (Flu A&B, Covid) - Nasopharyngeal Swab     Status: None   Collection Time: 02/13/20 10:17 PM   Specimen: Nasopharyngeal Swab  Result Value Ref Range Status   SARS Coronavirus 2 by RT PCR NEGATIVE NEGATIVE Final    Comment: (NOTE) SARS-CoV-2 target nucleic acids are NOT DETECTED.  The SARS-CoV-2 RNA is generally detectable in upper respiratoy specimens during the acute phase of infection. The lowest concentration of SARS-CoV-2 viral copies this assay can detect is 131 copies/mL. A negative result does not preclude SARS-Cov-2 infection and should not be used as the sole basis for treatment or other patient management decisions. A negative result may occur with  improper specimen collection/handling, submission of specimen other than nasopharyngeal swab, presence of viral mutation(s) within the areas targeted by this assay, and inadequate number of viral copies (<131 copies/mL). A negative result must be combined  with clinical observations, patient history, and epidemiological information. The expected result is Negative.  Fact Sheet for Patients:  PinkCheek.be  Fact Sheet for Healthcare Providers:  GravelBags.it  This test is no t yet approved or cleared by the [place] FDA and  has been authorized for detection and/or diagnosis of SARS-CoV-2 by FDA under an Emergency Use Authorization (EUA). This EUA will remain  in effect (meaning this test can be used) for the duration of the COVID-19 declaration under Section 564(b)(1) of the Act, 21 U.S.C. section 360bbb-3(b)(1), unless the authorization is terminated or revoked sooner.     Influenza A by PCR NEGATIVE NEGATIVE Final   Influenza B  by PCR NEGATIVE NEGATIVE Final    Comment: (NOTE) The Xpert Xpress SARS-CoV-2/FLU/RSV assay is intended as an aid in  the diagnosis of influenza from Nasopharyngeal swab specimens and  should not be used as a sole basis for treatment. Nasal washings and  aspirates are unacceptable for Xpert Xpress SARS-CoV-2/FLU/RSV  testing.  Fact Sheet for Patients: PinkCheek.be  Fact Sheet for Healthcare Providers: GravelBags.it  This test is not yet approved or cleared by the [place] FDA and  has been authorized for detection and/or diagnosis of SARS-CoV-2 by  FDA under an Emergency Use Authorization (EUA). This EUA will remain  in effect (meaning this test can be used) for the duration of the  Covid-19 declaration under Section 564(b)(1) of the Act, 21  U.S.C. section 360bbb-3(b)(1), unless the authorization is  terminated or revoked. Performed at [facility], [address] [place], [place] [address]          Radiology Studies: DG Chest 2 View  Result Date: 02/13/2020 CLINICAL DATA:  Pleural effusion EXAM: CHEST - 2 VIEW COMPARISON:  05/22/2019 FINDINGS: There is a well-positioned left-sided tunneled dialysis catheter. The patient is status post prior median sternotomy. The cardiac silhouette is enlarged. There are bilateral pleural effusions, moderate to large on the right and small to moderate on the left. Both of these pleural effusions appear to be at least partially loculated. There is adjacent airspace disease favored to represent atelectasis. There is no pneumothorax. IMPRESSION: 1. Bilateral pleural effusions, moderate to large on the right and small to moderate on the left. Both of these pleural effusions appear to be partially loculated. 2. Cardiomegaly. Electronically Signed   By: Constance Holster M.D.   On: 02/13/2020 20:59   DG Chest Port 1 View  Result Date: 02/15/2020 CLINICAL DATA:  Patient status  post right thoracentesis today. EXAM: PORTABLE CHEST 1 VIEW COMPARISON:  PA and lateral chest 02/13/2020. FINDINGS: Right pleural effusion is decreased after thoracentesis. No pneumothorax. Small left effusion is unchanged. There is cardiomegaly and vascular congestion. Aortic atherosclerosis is noted. The patient is status post CABG and aortic valve replacement. Dialysis catheter is in place. IMPRESSION: Decreased right effusion after thoracentesis. Negative for pneumothorax. No change in a small left pleural effusion and basilar atelectasis. Cardiomegaly and vascular congestion. Electronically Signed   By: Inge Rise M.D.   On: 02/15/2020 11:24   US THORACENTESIS ASP PLEURAL SPACE W/IMG GUIDE  Result Date: 02/15/2020 INDICATION: Patient history of coronary artery disease status post CABG with dyspnea found to have a bilateral pleural effusions. Request is for therapeutic right-sided thoracentesis EXAM: ULTRASOUND GUIDED THERAPEUTIC THORACENTESIS MEDICATIONS: Lidocaine 1% 10 mL COMPLICATIONS: None immediate. PROCEDURE: An ultrasound guided thoracentesis was thoroughly discussed with the patient and questions answered. The benefits, risks, alternatives and complications were also discussed. The patient understands and  wishes to proceed with the procedure. Written consent was obtained. Ultrasound performed showed a loculated right-sided pleural effusion. The largest pocket was localized and marked in the right sided chest. The area was then prepped and draped in the normal sterile fashion. 1% Lidocaine was used for local anesthesia. Under ultrasound guidance a 6 Fr Safe-T-Centesis catheter was introduced. Thoracentesis was performed. The catheter was removed and a dressing applied. FINDINGS: A total of approximately 500 mL of sanguineous fluid was removed. IMPRESSION: Successful ultrasound guided right-sided therapeutic thoracentesis yielding 500 mL of pleural fluid. Read by: Rushie Nyhan, NP  Electronically Signed   By: Lucrezia Europe M.D.   On: 02/15/2020 11:37        Scheduled Meds: . sodium chloride   Intravenous Once  . allopurinol  100 mg Oral Once per day on Mon Wed Fri  . aspirin EC  81 mg Oral QHS  . atorvastatin  20 mg Oral QHS  . Chlorhexidine Gluconate Cloth  6 each Topical Q0600  . escitalopram  5 mg Oral Daily  . ferrous sulfate  325 mg Oral QHS  . gabapentin  200 mg Oral Q breakfast  . gabapentin  300 mg Oral Q supper  . levothyroxine  75 mcg Oral Q0600  . melatonin  5 mg Oral QHS  . metoprolol tartrate  12.5 mg Oral BID  . pantoprazole  40 mg Oral Daily  . sodium chloride flush  3 mL Intravenous Q12H  . traZODone  50 mg Oral QHS   Continuous Infusions: . sodium chloride       LOS: 2 days    Time spent: 25 minutes    Sidney Ace, MD [facility] Pager [phone]  If 7PM-7AM, please contact night-coverage 02/15/2020, 1:03 PM

## 2020-02-15 NOTE — Progress Notes (Signed)
Progress Note  Patient Name: Dalton Roy Date of Encounter: 02/15/2020  Primary Cardiologist: [facility]  Subjective   Status post right-sided thoracentesis with 500 mL of sanguineous fluid removed. Reports his breathing is back to baseline. Remains on supplemental oxygen at 2 L (not on at baseline). No angina.   Inpatient Medications    Scheduled Meds: . sodium chloride   Intravenous Once  . allopurinol  100 mg Oral Once per day on Mon Wed Fri  . aspirin EC  81 mg Oral QHS  . atorvastatin  20 mg Oral QHS  . Chlorhexidine Gluconate Cloth  6 each Topical Q0600  . escitalopram  5 mg Oral Daily  . ferrous sulfate  325 mg Oral QHS  . gabapentin  200 mg Oral Q breakfast  . gabapentin  300 mg Oral Q supper  . levothyroxine  75 mcg Oral Q0600  . melatonin  5 mg Oral QHS  . metoprolol tartrate  12.5 mg Oral BID  . pantoprazole  40 mg Oral Daily  . sodium chloride flush  3 mL Intravenous Q12H  . traZODone  50 mg Oral QHS   Continuous Infusions: . sodium chloride     PRN Meds: sodium chloride, acetaminophen **OR** acetaminophen, calcium carbonate (dosed in mg elemental calcium), camphor-menthol **AND** hydrOXYzine, docusate sodium, feeding supplement (NEPRO CARB STEADY), ondansetron **OR** ondansetron (ZOFRAN) IV, sodium chloride flush, sorbitol, zolpidem   Vital Signs    Vitals:   02/15/20 0452 02/15/20 0758 02/15/20 0900 02/15/20 1137  BP: (!) 139/105 (!) 153/95 (!) 148/95 (!) 131/92  Pulse: 91 93  94  Resp: $Remo'20 18  18  'vPZyk$ Temp: (!) 97.5 F (36.4 C) (!) 97.5 F (36.4 C)  98.1 F (36.7 C)  TempSrc: Oral Oral  Oral  SpO2: 96% 97% 97% 99%  Weight:      Height:        Intake/Output Summary (Last 24 hours) at 02/15/2020 1301 Last data filed at 02/15/2020 0504 Gross per 24 hour  Intake --  Output 0 ml  Net 0 ml   Filed Weights   02/13/20 1956 02/14/20 0038 02/15/20 0418  Weight: 96.6 kg 95.8 kg 96 kg    Telemetry    SR - Personally Reviewed  ECG    No  new tracings - Personally Reviewed  Physical Exam   GEN: No acute distress.   Neck: No JVD. Cardiac: RRR, no murmurs, rubs, or gallops. Well healing midline anterior chest wall surgical site.  Respiratory: Diminished breath sounds along the left base.  GI: Soft, nontender, non-distended.   MS: Trace bilateral pretibial edema; No deformity. Neuro:  Alert and oriented x 3; Nonfocal.  Psych: Normal affect.  Labs    Chemistry Recent Labs  Lab 02/13/20 2003 02/14/20 0710  NA 135 138  K 3.4* 3.5  CL 98 99  CO2 26 30  GLUCOSE 131* 88  BUN 16 22  CREATININE 4.01* 4.84*  CALCIUM 7.8* 8.1*  PROT 7.0 6.7  ALBUMIN 2.7* 2.7*  AST 26 21  ALT 10 10  ALKPHOS 94 86  BILITOT 0.9 1.1  GFRNONAA 15* 12*  ANIONGAP 11 9     Hematology Recent Labs  Lab 02/13/20 2003 02/14/20 0710  WBC 9.5 7.9  RBC 2.48* 2.69*  HGB 7.7* 8.3*  HCT 24.4* 25.8*  MCV 98.4 95.9  MCH 31.0 30.9  MCHC 31.6 32.2  RDW 15.4 16.1*  PLT 191 176    Cardiac EnzymesNo results for input(s): TROPONINI in  the last 168 hours. No results for input(s): TROPIPOC in the last 168 hours.   BNPNo results for input(s): BNP, PROBNP in the last 168 hours.   DDimer No results for input(s): DDIMER in the last 168 hours.   Radiology    DG Chest 2 View  Result Date: 02/13/2020 IMPRESSION: 1. Bilateral pleural effusions, moderate to large on the right and small to moderate on the left. Both of these pleural effusions appear to be partially loculated. 2. Cardiomegaly. Electronically Signed   By: Constance Holster M.D.   On: 02/13/2020 20:59   DG Chest Port 1 View  Result Date: 02/15/2020 IMPRESSION: Decreased right effusion after thoracentesis. Negative for pneumothorax. No change in a small left pleural effusion and basilar atelectasis. Cardiomegaly and vascular congestion. Electronically Signed   By: Inge Rise M.D.   On: 02/15/2020 11:24   US THORACENTESIS ASP PLEURAL SPACE W/IMG GUIDE  Result Date:  02/15/2020 IMPRESSION: Successful ultrasound guided right-sided therapeutic thoracentesis yielding 500 mL of pleural fluid. Read by: Rushie Nyhan, NP Electronically Signed   By: Lucrezia Europe M.D.   On: 02/15/2020 11:37    Cardiac Studies   2D echo 02/15/2020: Pending __________  2D echo 12/2019 ([facility]): INTERPRETATION ---------------------------------------------------------------  NORMAL LEFT VENTRICULAR SYSTOLIC FUNCTION WITH MILD LVH  NORMAL LA PRESSURES WITH NORMAL DIASTOLIC FUNCTION  NORMAL RIGHT VENTRICULAR SYSTOLIC FUNCTION  VALVULAR REGURGITATION: MILD AR, MILD MR, TRIVIAL PR, MILD TR  VALVULAR STENOSIS: MODERATE AS  TRIVIAL PERICARDIAL EFFUSION  LIMITED PARASTERNAL WINDOWS.  NO PRIOR STUDY FOR COMPARISON  __________  2D echo 05/2019: 1. Left ventricular ejection fraction, by estimation, is 60 to 65%. The  left ventricle has normal function. The left ventricle has no regional  wall motion abnormalities. Left ventricular diastolic parameters were  normal.  2. Right ventricular systolic function is normal. The right ventricular  size is normal. There is mildly elevated pulmonary artery systolic  pressure.  3. The mitral valve is degenerative. Mild mitral valve regurgitation. No  evidence of mitral stenosis.  4. The aortic valve is tricuspid. Aortic valve regurgitation is moderate.  Moderate aortic valve stenosis.  5. Distal aortic root measured off axis at 4.5 cm . aortic dilatation  noted. There is mild to moderate dilatation of the aortic root measuring  42 mm.  __________  Nuclear stress test 05/2019:  No T wave inversion was noted during stress.  There was no ST segment deviation noted during stress.  The study is normal.  This is a low risk study.  The left ventricular ejection fraction is normal (55-65%).   Normal resting and stress perfusion. No ischemia or infarction EF 56%  Patient Profile     79 y.o. 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   Assessment & Plan    1. Acute respiratory distress with hypoxia/bilateral pleural effusions: -Status post right-sided thoracentesis with 500 mL of sanguineous fluid removed -No pleural fluid labs/cytology for review  -Cannot exclude third spacing from hypoalbuminemia  -Wean supplemental oxygen as able -Echo pending -Volume management per HD  2. CAD s/p recent CABG with elevated HS-Tn: -Currently, without chest pain -Mildly elevated HS-Tn with a peak of 526 and down trending, felt to be  related to demand ischemia from dyspnea, underlying multivessel CAD, anemia, and ESRD -Not consistent with ACS -Echo pending, further recommendations pending -ASA -Lipitor  -Metoprolol   3. Severe aortic insufficiency: -Status post AVR in 01/2020 -Await echo  4. HTN: -Blood pressure is improving  -Lopressor   5. HLD: -No recent LDL for review -Lipitor -[facility]   6. History of multiple VTE: -PTA Eliquis held for thoracentesis, resume per IM  For questions or updates, please contact [place] Please consult [email or web address] for contact info under Cardiology/STEMI.    Signed, Christell Faith, PA-C [place] Pager: [phone] 02/15/2020, 1:01 PM

## 2020-02-15 NOTE — Progress Notes (Signed)
patient complaining of intermittant chest pain at incision. states as "new pain".  MD notified.  No calls from tele.   02/15/20 0758  Vitals  Temp (!) 97.5 F (36.4 C)  Temp Source Oral  BP (!) 153/95  MAP (mmHg) 106  BP Location Right Arm  BP Method Automatic  Patient Position (if appropriate) Lying  Pulse Rate 93  Pulse Rate Source Monitor  Resp 18  MEWS COLOR  MEWS Score Color Green  Oxygen Therapy  SpO2 97 %  O2 Device Nasal Cannula  O2 Flow Rate (L/min) 2 L/min  Pain Assessment  Pain Scale 0-10  Pain Score 4  Pain Type Acute pain  Pain Location Chest  Pain Orientation Mid  Pain Descriptors / Indicators Aching  Pain Onset On-going  Pain Intervention(s) MD notified (Comment)  MEWS Score  MEWS Temp 0  MEWS Systolic 0  MEWS Pulse 0  MEWS RR 0  MEWS LOC 0  MEWS Score 0   Also wants to know when procedure will happen.

## 2020-02-15 NOTE — Discharge Summary (Signed)
Physician Discharge Summary  Dalton Roy:[id number] DOB: 08/03/1940 DOA: 02/13/2020  PCP: [facility]  Admit date: 02/13/2020 Discharge date: 02/16/2020  Admitted From: SNF Disposition:  Home with home health  Recommendations for Outpatient Follow-up:  1. Follow up with PCP in 1-2 weeks 2. Follow-up with cardiology as directed 3. Follow-up with nephrology as directed  Home Health: Yes Equipment/Devices: No Discharge Condition: Stable CODE STATUS: Full Diet recommendation: Heart Healthy Brief/Interim Summary: 79 year old male history of coronary disease status post CABG approximately 2 months ago who was discharged to skilled nursing facility now presents to [facility] with complaints of dyspnea.  Patient not requiring supplemental oxygen.  On admission he was found to have bilateral layering pleural effusions right greater than left.  Patient is also recent hemodialysis started as he had a complicated postoperative course and nephrology has been consulted here for hemodialysis  11/7: On my evaluation patient is resting comfortably in bed.  He is able to speak in complete sentences but is notably dyspneic with repeated conversation.  Nephrology and cardiology have been consulted.  Recommendations appreciated.  Plan for thoracentesis tomorrow 11/8  11/8: Patient remained stable overnight.  Continues to have some dyspnea on exertion.    Thoracentesis completed.  500 cc sanguinous fluid removed.  Patient symptomatically improved afterwards.  Considering sanguinous nature of the pleural fluid will pursue CT chest noncontrast.  Cardiology following.   11/9: Chest CT complete.  No concerning signs.  Cardiology reviewed echocardiogram and noted a moderate pericardial effusion without signs of tamponade.  No further inpatient management from their standpoint.  Patient medically stable for discharge.  Communicated with nephrology prior to discharge.  Patient underwent hemodialysis in  house.  Will recommend torsemide every other day on nondialysis days post discharge.  Patient to follow-up with his established doctors at the [place].  Discharge Diagnoses:  Principal Problem:   Pleural effusion, right Active Problems:   Essential hypertension   SOB (shortness of breath)   ESRD (end stage renal disease) (HCC)   Hypokalemia   Troponin I above reference range   Symptomatic anemia   Coronary artery disease involving native coronary artery of native heart without angina pectoris   Aortic valve disease  Bilateral pleural effusions, right greater than left Likely postsurgical as well as in the setting of ESRD IR consulted from admission Status post image guided thoracentesis 500 cc pleural fluid removed, sanguinous in nature No further inpatient management Weaned off room air at time of discharge  Coronary disease status post CABG Elevated troponins Patient with no chest pain Troponin elevation likely representative of demand ischemia post CABG Cardiology consulted No ischemic changes noted on telemetry 2D echocardiogram, moderate pericardial effusion, no intervention necessary at this time.  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 On hold for thoracentesis Can restart on post procedure day #1  Aortic insufficiency status post AVR Echocardiogram reviewed by cardiology  ESRD on hemodialysis Nephrology consult for hemodialysis  Anemia Acuity unclear Suspect chronic in nature Transfused 1 unit packed red cells Hemoglobin responded appropriately No bleeding noted Continue to monitor No bleeding noted time of discharge  Discharge Instructions  Discharge Instructions    Diet - low sodium heart healthy   Complete by: As directed    Increase activity slowly   Complete by: As directed      Allergies as of 02/16/2020      Reactions   Codeine Other (See Comments)   hyperactive  Medication List     TAKE these medications   acetaminophen 325 MG tablet Commonly known as: TYLENOL Take 975 mg by mouth every 8 (eight) hours as needed.   allopurinol 300 MG tablet Commonly known as: ZYLOPRIM TAKE 1 TABLET(300 MG) BY MOUTH DAILY What changed: See the new instructions.   apixaban 5 MG Tabs tablet Commonly known as: ELIQUIS Take 5 mg by mouth 2 (two) times daily.   aspirin EC 81 MG tablet Take 81 mg by mouth at bedtime. Swallow whole.   atorvastatin 40 MG tablet Commonly known as: LIPITOR Take 20 mg by mouth at bedtime. While on cipro   capsaicin 0.025 % cream Commonly known as: ZOSTRIX Apply topically 2 (two) times daily.   ciprofloxacin 500 MG tablet Commonly known as: CIPRO Take 500 mg by mouth at bedtime.   escitalopram 5 MG tablet Commonly known as: LEXAPRO Take 5 mg by mouth daily.   FerrouSul 325 (65 FE) MG tablet Generic drug: ferrous sulfate Take 325 mg by mouth at bedtime.   gabapentin 100 MG capsule Commonly known as: NEURONTIN Take 100-300 mg by mouth See admin instructions. Take 200 mg with breakfast, and 300 mg with supper   levothyroxine 75 MCG tablet Commonly known as: SYNTHROID Take 1 tablet (75 mcg total) by mouth daily before breakfast.   melatonin 5 MG Tabs Take 5 mg by mouth at bedtime.   metoprolol tartrate 25 MG tablet Commonly known as: LOPRESSOR Take 12.5 mg by mouth 2 (two) times daily.   torsemide 20 MG tablet Commonly known as: DEMADEX Take 3 tablets (60 mg total) by mouth every other day. Take on non-dialysis days   traZODone 50 MG tablet Commonly known as: DESYREL Take 50 mg by mouth at bedtime.       Allergies  Allergen Reactions  . Codeine Other (See Comments)    hyperactive    Consultations:  Cardiology  Nephrology   Procedures/Studies: DG Chest 2 View  Result Date: 02/13/2020 CLINICAL DATA:  Pleural effusion EXAM: CHEST - 2 VIEW COMPARISON:  05/22/2019 FINDINGS: There is a well-positioned left-sided  tunneled dialysis catheter. The patient is status post prior median sternotomy. The cardiac silhouette is enlarged. There are bilateral pleural effusions, moderate to large on the right and small to moderate on the left. Both of these pleural effusions appear to be at least partially loculated. There is adjacent airspace disease favored to represent atelectasis. There is no pneumothorax. IMPRESSION: 1. Bilateral pleural effusions, moderate to large on the right and small to moderate on the left. Both of these pleural effusions appear to be partially loculated. 2. Cardiomegaly. Electronically Signed   By: Constance Holster M.D.   On: 02/13/2020 20:59   CT CHEST WO CONTRAST  Result Date: 02/15/2020 CLINICAL DATA:  79 year old male status post ultrasound-guided right side thoracentesis today. Recent 3 vessel CABG, aortic valve replacement last month at [facility] in [place]. End stage renal disease. EXAM: CT CHEST WITHOUT CONTRAST TECHNIQUE: Multidetector CT imaging of the chest was performed following the standard protocol without IV contrast. COMPARISON:  Portable chest earlier today. Chest radiographs 02/13/2020. Non-contrast CT Abdomen and Pelvis 06/10/2006. FINDINGS: Cardiovascular: Cardiomegaly is new since 2008. Small volume pericardial effusion with complex fluid density (30 Hounsfield units). Sequelae of CABG and aortic valve replacement. Mediastinum/Nodes: Recent postoperative changes to the sternum. Trace postoperative mediastinal hematoma. No lymphadenopathy. Lungs/Pleura: No pneumothorax.  Major airways are patent. Small to moderate residual right pleural effusion which is mostly layering, and averages simple  fluid density (series 2, image 80) although there are some subtle areas of hyperdense foci within the effusion (series 2, image 122). Superimposed compressive right lung atelectasis. Calcified right lower lobe granuloma. Noncalcified right middle lobe lung nodule measuring 6 mm on series 4,  image 81 at a level which was not included on the 2008 CT. More lobulated left pleural effusion which partially tracks within the left major fissure, averages simple fluid density but has more areas of hyperdensity at the costophrenic angle than on the right side (series 2 image 125). Compressive left lung atelectasis. Upper Abdomen: Cholelithiasis. No pericholecystic inflammation. Negative visible noncontrast liver, spleen, adrenal glands and bowel in the upper abdomen. Dystrophic calcifications of the pancreas are new since 2008. 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 The largest bilateral cysts have simple fluid density. Calcified aortic atherosclerosis. Musculoskeletal: Unhealed sternotomy. Sternotomy wires appear intact. Benign bone island suspected in the left lateral 7th rib. No acute or suspicious osseous lesion identified. IMPRESSION: 1. Recent CABG and aortic valve replacement with trace postoperative hematoma in the mediastinum and trace hemopericardium suspected. 2. No pneumothorax following thoracentesis. Bilateral pleural effusions with predominantly simple fluid density although there are several nonspecific hyperdense areas which are nonspecific but might be clotted blood. More of the left pleural effusion tracks along the major fissure. Bilateral compressive atelectasis. 3. Noncalcified 6 mm right middle lobe lung nodule. Non-contrast chest CT at 6-12 months is recommended. If the nodule is stable at time of repeat CT, then future CT at 18-24 months (from today's scan) is considered optional for low-risk patients, but is recommended for high-risk patients. This recommendation follows the consensus statement: Guidelines for Management of Incidental Pulmonary Nodules Detected on CT Images: From the Fleischner Society 2017; Radiology 2017; 284:228-243. 4. Cholelithiasis. Chronic polycystic renal disease. Aortic Atherosclerosis (ICD10-I70.0). Electronically Signed    By: Genevie Ann M.D.   On: 02/15/2020 16:48   DG Chest Port 1 View  Result Date: 02/15/2020 CLINICAL DATA:  Patient status post right thoracentesis today. EXAM: PORTABLE CHEST 1 VIEW COMPARISON:  PA and lateral chest 02/13/2020. FINDINGS: Right pleural effusion is decreased after thoracentesis. No pneumothorax. Small left effusion is unchanged. There is cardiomegaly and vascular congestion. Aortic atherosclerosis is noted. The patient is status post CABG and aortic valve replacement. Dialysis catheter is in place. IMPRESSION: Decreased right effusion after thoracentesis. Negative for pneumothorax. No change in a small left pleural effusion and basilar atelectasis. Cardiomegaly and vascular congestion. Electronically Signed   By: Inge Rise M.D.   On: 02/15/2020 11:24   ECHOCARDIOGRAM COMPLETE  Result Date: 02/15/2020    ECHOCARDIOGRAM REPORT   Patient Name:   JEFFRY VOGELSANG Geeslin Date of Exam: 02/15/2020 Medical Rec #:  [id number]      Height:       70.0 in Accession #:    [id number]     Weight:       211.6 lb Date of Birth:  [birth year]      BSA:          2.138 m Patient Age:    71 years       BP:           153/95 mmHg Patient Gender: M              HR:           93 bpm. Exam Location:  [facility] Procedure: 2D Echo, Cardiac Doppler and Color Doppler Indications:     Dyspnea 786.09  History:         Patient has prior history of Echocardiogram examinations, most                  recent 05/22/2019. Prior CABG, Signs/Symptoms:Murmur; Risk                  Factors:Hypertension. Bipolar disorder 1.  Sonographer:     Sherrie Sport RDCS (AE) Referring Phys:  [id number] Kate Sable Diagnosing Phys: Kathlyn Sacramento MD IMPRESSIONS  1. Left ventricular ejection fraction, by estimation, is 55 to 60%. The left ventricle has normal function. Left ventricular endocardial border not optimally defined to evaluate regional wall motion. There is moderate left ventricular hypertrophy. Left ventricular diastolic parameters are indeterminate.   2. Right ventricular systolic function is normal. The right ventricular size is normal. There is mildly elevated pulmonary artery systolic pressure.  3. Left atrial size was mildly dilated.  4. Right atrial size was mildly dilated.  5. Moderate pericardial effusion. The pericardial effusion is posterior to the left ventricle.  6. The mitral valve is normal in structure. Mild mitral valve regurgitation. No evidence of mitral stenosis.  7. The aortic valve was not well visualized. Aortic valve regurgitation is not visualized. Mild aortic valve stenosis. Aortic valve area, by VTI measures 1.70 cm. Aortic valve mean gradient measures 5.7 mmHg.  8. The inferior vena cava is dilated in size with >50% respiratory variability, suggesting right atrial pressure of 8 mmHg.  9. challening image quality. FINDINGS  Left Ventricle: Left ventricular ejection fraction, by estimation, is 55 to 60%. The left ventricle has normal function. Left ventricular endocardial border not optimally defined to evaluate regional wall motion. The left ventricular internal cavity size was normal in size. There is moderate left ventricular hypertrophy. Left ventricular diastolic parameters are indeterminate. Right Ventricle: The right ventricular size is normal. No increase in right ventricular wall thickness. Right ventricular systolic function is normal. There is mildly elevated pulmonary artery systolic pressure. The tricuspid regurgitant velocity is 2.89  m/s, and with an assumed right atrial pressure of 8 mmHg, the estimated right ventricular systolic pressure is 09.4 mmHg. Left Atrium: Left atrial size was mildly dilated. Right Atrium: Right atrial size was mildly dilated. Pericardium: A moderately sized pericardial effusion is present. The pericardial effusion is posterior to the left ventricle. Mitral Valve: The mitral valve is normal in structure. Mild mitral valve regurgitation. No evidence of mitral valve stenosis. Tricuspid Valve: The  tricuspid valve is normal in structure. Tricuspid valve regurgitation is mild . No evidence of tricuspid stenosis. Aortic Valve: The aortic valve was not well visualized. Aortic valve regurgitation is not visualized. Mild aortic stenosis is present. Aortic valve mean gradient measures 5.7 mmHg. Aortic valve peak gradient measures 10.8 mmHg. Aortic valve area, by VTI measures 1.70 cm. Pulmonic Valve: The pulmonic valve was normal in structure. Pulmonic valve regurgitation is not visualized. No evidence of pulmonic stenosis. Aorta: The aortic root is normal in size and structure. Venous: The inferior vena cava is dilated in size with greater than 50% respiratory variability, suggesting right atrial pressure of 8 mmHg. IAS/Shunts: No atrial level shunt detected by color flow Doppler.  LEFT VENTRICLE PLAX 2D LVIDd:         3.53 cm  Diastology LVIDs:         2.60 cm  LV e' medial:    12.10 cm/s LV PW:         1.42 cm  LV E/e' medial:  7.8 LV  IVS:        1.21 cm  LV e' lateral:   5.87 cm/s LVOT diam:     2.00 cm  LV E/e' lateral: 16.1 LV SV:         36 LV SV Index:   17 LVOT Area:     3.14 cm  RIGHT VENTRICLE RV Basal diam:  3.81 cm RV S prime:     11.60 cm/s LEFT ATRIUM             Index       RIGHT ATRIUM           Index LA diam:        4.80 cm 2.24 cm/m  RA Area:     20.70 cm LA Vol (A2C):   67.4 ml 31.52 ml/m RA Volume:   51.40 ml  24.04 ml/m LA Vol (A4C):   76.9 ml 35.97 ml/m LA Biplane Vol: 73.9 ml 34.56 ml/m  AORTIC VALVE                    PULMONIC VALVE AV Area (Vmax):    1.41 cm     PV Vmax:        1.00 m/s AV Area (Vmean):   1.43 cm     PV Peak grad:   4.0 mmHg AV Area (VTI):     1.70 cm     RVOT Peak grad: 5 mmHg AV Vmax:           164.33 cm/s AV Vmean:          110.333 cm/s AV VTI:            0.211 m AV Peak Grad:      10.8 mmHg AV Mean Grad:      5.7 mmHg LVOT Vmax:         73.80 cm/s LVOT Vmean:        50.300 cm/s LVOT VTI:          0.114 m LVOT/AV VTI ratio: 0.54  AORTA Ao Root diam: 3.00 cm  MITRAL VALVE               TRICUSPID VALVE MV Area (PHT): 4.21 cm    TR Peak grad:   33.4 mmHg MV Decel Time: 180 msec    TR Vmax:        289.00 cm/s MV E velocity: 94.30 cm/s MV A velocity: 94.30 cm/s  SHUNTS MV E/A ratio:  1.00        Systemic VTI:  0.11 m                            Systemic Diam: 2.00 cm Kathlyn Sacramento MD Electronically signed by Kathlyn Sacramento MD Signature Date/Time: 02/15/2020/2:24:49 PM    Final    US THORACENTESIS ASP PLEURAL SPACE W/IMG GUIDE  Result Date: 02/15/2020 INDICATION: Patient history of coronary artery disease status post CABG with dyspnea found to have a bilateral pleural effusions. Request is for therapeutic right-sided thoracentesis EXAM: ULTRASOUND GUIDED THERAPEUTIC THORACENTESIS MEDICATIONS: Lidocaine 1% 10 mL COMPLICATIONS: None immediate. PROCEDURE: An ultrasound guided thoracentesis was thoroughly discussed with the patient and questions answered. The benefits, risks, alternatives and complications were also discussed. The patient understands and wishes to proceed with the procedure. Written consent was obtained. Ultrasound performed showed a loculated right-sided pleural effusion. The largest pocket was localized and marked in the right sided chest. The area was then prepped  and draped in the normal sterile fashion. 1% Lidocaine was used for local anesthesia. Under ultrasound guidance a 6 Fr Safe-T-Centesis catheter was introduced. Thoracentesis was performed. The catheter was removed and a dressing applied. FINDINGS: A total of approximately 500 mL of sanguineous fluid was removed. IMPRESSION: Successful ultrasound guided right-sided therapeutic thoracentesis yielding 500 mL of pleural fluid. Read by: Rushie Nyhan, NP Electronically Signed   By: Lucrezia Europe M.D.   On: 02/15/2020 11:37   (Echo, Carotid, EGD, Colonoscopy, ERCP)    Subjective: Seen and examined the day of discharge.  Post procedurally.  Stable, no distress.  Stable for discharge  home.  Discharge Exam: Vitals:   02/16/20 1000 02/16/20 1015  BP: 115/88 (!) 132/96  Pulse:    Resp: (!) 22 (!) 22  Temp:    SpO2:  92%   Vitals:   02/16/20 0930 02/16/20 0945 02/16/20 1000 02/16/20 1015  BP:  126/88 115/88 (!) 132/96  Pulse:      Resp:   (!) 22 (!) 22  Temp:  97.6 F (36.4 C)    TempSrc:  Oral    SpO2: 94%   92%  Weight:      Height:        General: Pt is alert, awake, not in acute distress Cardiovascular: Regular rate and rhythm, 2/6 systolic murmur Respiratory: Bibasilar crackles.  No wheezes.  Normal work of breathing.  Room air Abdominal: Soft, NT, ND, bowel sounds + Extremities: 1+ pitting edema bilaterally, no cyanosis    The results of significant diagnostics from this hospitalization (including imaging, microbiology, ancillary and laboratory) are listed below for reference.     Microbiology: Recent Results (from the past 240 hour(s))  Respiratory Panel by RT PCR (Flu A&B, Covid) - Nasopharyngeal Swab     Status: None   Collection Time: 02/13/20 10:17 PM   Specimen: Nasopharyngeal Swab  Result Value Ref Range Status   SARS Coronavirus 2 by RT PCR NEGATIVE NEGATIVE Final    Comment: (NOTE) SARS-CoV-2 target nucleic acids are NOT DETECTED.  The SARS-CoV-2 RNA is generally detectable in upper respiratoy specimens during the acute phase of infection. The lowest concentration of SARS-CoV-2 viral copies this assay can detect is 131 copies/mL. A negative result does not preclude SARS-Cov-2 infection and should not be used as the sole basis for treatment or other patient management decisions. A negative result may occur with  improper specimen collection/handling, submission of specimen other than nasopharyngeal swab, presence of viral mutation(s) within the areas targeted by this assay, and inadequate number of viral copies (<131 copies/mL). A negative result must be combined with clinical observations, patient history, and epidemiological  information. The expected result is Negative.  Fact Sheet for Patients:  PinkCheek.be  Fact Sheet for Healthcare Providers:  GravelBags.it  This test is no t yet approved or cleared by the [place] FDA and  has been authorized for detection and/or diagnosis of SARS-CoV-2 by FDA under an Emergency Use Authorization (EUA). This EUA will remain  in effect (meaning this test can be used) for the duration of the COVID-19 declaration under Section 564(b)(1) of the Act, 21 U.S.C. section 360bbb-3(b)(1), unless the authorization is terminated or revoked sooner.     Influenza A by PCR NEGATIVE NEGATIVE Final   Influenza B by PCR NEGATIVE NEGATIVE Final    Comment: (NOTE) The Xpert Xpress SARS-CoV-2/FLU/RSV assay is intended as an aid in  the diagnosis of influenza from Nasopharyngeal swab specimens and  should not be used  as a sole basis for treatment. Nasal washings and  aspirates are unacceptable for Xpert Xpress SARS-CoV-2/FLU/RSV  testing.  Fact Sheet for Patients: PinkCheek.be  Fact Sheet for Healthcare Providers: GravelBags.it  This test is not yet approved or cleared by the [place] FDA and  has been authorized for detection and/or diagnosis of SARS-CoV-2 by  FDA under an Emergency Use Authorization (EUA). This EUA will remain  in effect (meaning this test can be used) for the duration of the  Covid-19 declaration under Section 564(b)(1) of the Act, 21  U.S.C. section 360bbb-3(b)(1), unless the authorization is  terminated or revoked. Performed at [facility], [address] [place], [place] [address]      Labs: BNP (last 3 results) Recent Labs    05/22/19 0003  BNP 62.3   Basic Metabolic Panel: Recent Labs  Lab 02/13/20 2003 02/14/20 0710  NA 135 138  K 3.4* 3.5  CL 98 99  CO2 26 30  GLUCOSE 131* 88  BUN 16 22  CREATININE  4.01* 4.84*  CALCIUM 7.8* 8.1*   Liver Function Tests: Recent Labs  Lab 02/13/20 2003 02/14/20 0710  AST 26 21  ALT 10 10  ALKPHOS 94 86  BILITOT 0.9 1.1  PROT 7.0 6.7  ALBUMIN 2.7* 2.7*   No results for input(s): LIPASE, AMYLASE in the last 168 hours. No results for input(s): AMMONIA in the last 168 hours. CBC: Recent Labs  Lab 02/13/20 2003 02/14/20 0710  WBC 9.5 7.9  NEUTROABS 4.8  --   HGB 7.7* 8.3*  HCT 24.4* 25.8*  MCV 98.4 95.9  PLT 191 176   Cardiac Enzymes: No results for input(s): CKTOTAL, CKMB, CKMBINDEX, TROPONINI in the last 168 hours. BNP: Invalid input(s): POCBNP CBG: No results for input(s): GLUCAP in the last 168 hours. D-Dimer No results for input(s): DDIMER in the last 72 hours. Hgb A1c No results for input(s): HGBA1C in the last 72 hours. Lipid Profile No results for input(s): CHOL, HDL, LDLCALC, TRIG, CHOLHDL, LDLDIRECT in the last 72 hours. Thyroid function studies No results for input(s): TSH, T4TOTAL, T3FREE, THYROIDAB in the last 72 hours.  Invalid input(s): FREET3 Anemia work up No results for input(s): VITAMINB12, FOLATE, FERRITIN, TIBC, IRON, RETICCTPCT in the last 72 hours. Urinalysis    Component Value Date/Time   COLORURINE YELLOW 05/22/2019 0040   APPEARANCEUR HAZY (A) 05/22/2019 0040   LABSPEC 1.015 05/22/2019 0040   PHURINE 5.0 05/22/2019 0040   GLUCOSEU NEGATIVE 05/22/2019 0040   HGBUR NEGATIVE 05/22/2019 0040   BILIRUBINUR NEGATIVE 05/22/2019 0040   KETONESUR NEGATIVE 05/22/2019 0040   PROTEINUR NEGATIVE 05/22/2019 0040   NITRITE NEGATIVE 05/22/2019 0040   LEUKOCYTESUR NEGATIVE 05/22/2019 0040   Sepsis Labs Invalid input(s): PROCALCITONIN,  WBC,  LACTICIDVEN Microbiology Recent Results (from the past 240 hour(s))  Respiratory Panel by RT PCR (Flu A&B, Covid) - Nasopharyngeal Swab     Status: None   Collection Time: 02/13/20 10:17 PM   Specimen: Nasopharyngeal Swab  Result Value Ref Range Status   SARS  Coronavirus 2 by RT PCR NEGATIVE NEGATIVE Final    Comment: (NOTE) SARS-CoV-2 target nucleic acids are NOT DETECTED.  The SARS-CoV-2 RNA is generally detectable in upper respiratoy specimens during the acute phase of infection. The lowest concentration of SARS-CoV-2 viral copies this assay can detect is 131 copies/mL. A negative result does not preclude SARS-Cov-2 infection and should not be used as the sole basis for treatment or other patient management decisions. A negative result  may occur with  improper specimen collection/handling, submission of specimen other than nasopharyngeal swab, presence of viral mutation(s) within the areas targeted by this assay, and inadequate number of viral copies (<131 copies/mL). A negative result must be combined with clinical observations, patient history, and epidemiological information. The expected result is Negative.  Fact Sheet for Patients:  PinkCheek.be  Fact Sheet for Healthcare Providers:  GravelBags.it  This test is no t yet approved or cleared by the [place] FDA and  has been authorized for detection and/or diagnosis of SARS-CoV-2 by FDA under an Emergency Use Authorization (EUA). This EUA will remain  in effect (meaning this test can be used) for the duration of the COVID-19 declaration under Section 564(b)(1) of the Act, 21 U.S.C. section 360bbb-3(b)(1), unless the authorization is terminated or revoked sooner.     Influenza A by PCR NEGATIVE NEGATIVE Final   Influenza B by PCR NEGATIVE NEGATIVE Final    Comment: (NOTE) The Xpert Xpress SARS-CoV-2/FLU/RSV assay is intended as an aid in  the diagnosis of influenza from Nasopharyngeal swab specimens and  should not be used as a sole basis for treatment. Nasal washings and  aspirates are unacceptable for Xpert Xpress SARS-CoV-2/FLU/RSV  testing.  Fact Sheet for  Patients: PinkCheek.be  Fact Sheet for Healthcare Providers: GravelBags.it  This test is not yet approved or cleared by the [place] FDA and  has been authorized for detection and/or diagnosis of SARS-CoV-2 by  FDA under an Emergency Use Authorization (EUA). This EUA will remain  in effect (meaning this test can be used) for the duration of the  Covid-19 declaration under Section 564(b)(1) of the Act, 21  U.S.C. section 360bbb-3(b)(1), unless the authorization is  terminated or revoked. Performed at [facility], [address] [place], [place] [address]      Time coordinating discharge: Over 30 minutes  SIGNED:   Sidney Ace, MD  [facility] 02/16/2020, 12:13 PM Pager   If 7PM-7AM, please contact night-coverage

## 2020-02-16 DIAGNOSIS — I313 Pericardial effusion (noninflammatory): Secondary | ICD-10-CM

## 2020-02-16 DIAGNOSIS — J90 Pleural effusion, not elsewhere classified: Principal | ICD-10-CM

## 2020-02-16 LAB — HEPATITIS B SURFACE ANTIGEN: Hepatitis B Surface Ag: NONREACTIVE

## 2020-02-16 LAB — PHOSPHORUS: Phosphorus: 6.2 mg/dL — ABNORMAL HIGH (ref 2.5–4.6)

## 2020-02-16 MED ORDER — APIXABAN 5 MG PO TABS: 5.0000 mg | ORAL_TABLET | Freq: Two times a day (BID) | ORAL | Status: DC

## 2020-02-16 MED ORDER — TORSEMIDE 20 MG PO TABS: 60.0000 mg | ORAL_TABLET | ORAL | 0 refills | Status: DC

## 2020-02-16 NOTE — Progress Notes (Signed)
OT Cancellation Note  Patient Details Name: Dalton Roy MRN: [id number] DOB: 05-25-1940   Cancelled Treatment:    Reason Eval/Treat Not Completed: Patient at procedure or test/ unavailable  Pt off floor to HD at this time. Will f/u at later date/time as able for OT treatment. Thank you.  Gerrianne Scale, [place], OTR/L ascom [phone] 02/16/20, 11:36 AM

## 2020-02-16 NOTE — Progress Notes (Signed)
Progress Note  Patient Name: Dalton Roy Date of Encounter: 02/16/2020  Primary Cardiologist: [facility]  Subjective   Seen in HD. He continues to note improvement in dyspnea following thoracentesis on 11/8. He was briefly off supplemental oxygen this morning, though his saturations did drop into the 80s on room air during dialysis. No angina.   Inpatient Medications    Scheduled Meds: . sodium chloride   Intravenous Once  . allopurinol  100 mg Oral Once per day on Mon Wed Fri  . apixaban  5 mg Oral BID  . aspirin EC  81 mg Oral QHS  . atorvastatin  20 mg Oral QHS  . Chlorhexidine Gluconate Cloth  6 each Topical Q0600  . escitalopram  5 mg Oral Daily  . ferrous sulfate  325 mg Oral QHS  . gabapentin  200 mg Oral Q breakfast  . gabapentin  300 mg Oral Q supper  . levothyroxine  75 mcg Oral Q0600  . melatonin  5 mg Oral QHS  . metoprolol tartrate  12.5 mg Oral BID  . pantoprazole  40 mg Oral Daily  . sodium chloride flush  3 mL Intravenous Q12H  . torsemide  60 mg Oral Daily  . traZODone  50 mg Oral QHS   Continuous Infusions: . sodium chloride     PRN Meds: sodium chloride, acetaminophen **OR** acetaminophen, calcium carbonate (dosed in mg elemental calcium), camphor-menthol **AND** hydrOXYzine, docusate sodium, feeding supplement (NEPRO CARB STEADY), ondansetron **OR** ondansetron (ZOFRAN) IV, sodium chloride flush, sorbitol, traMADol, zolpidem   Vital Signs    Vitals:   02/16/20 0930 02/16/20 0945 02/16/20 1000 02/16/20 1015  BP:  126/88 115/88 (!) 132/96  Pulse:      Resp:   (!) 22 (!) 22  Temp:  97.6 F (36.4 C)    TempSrc:  Oral    SpO2: 94%   92%  Weight:      Height:       No intake or output data in the 24 hours ending 02/16/20 1109 Filed Weights   02/14/20 0038 02/15/20 0418 02/16/20 0500  Weight: 95.8 kg 96 kg 80.5 kg    Telemetry    Not currently on telemetry (in HD) - Personally Reviewed  ECG    No new tracings - Personally  Reviewed  Physical Exam   GEN: No acute distress.   Neck: No JVD. Cardiac: RRR, no murmurs, rubs, or gallops. Well healing midline anterior chest wall surgical site.  Respiratory: Diminished breath sounds along the left base.  GI: Soft, nontender, non-distended.   MS: Trace bilateral pretibial edema; No deformity. Neuro:  Alert and oriented x 3; Nonfocal.  Psych: Normal affect.  Labs    Chemistry Recent Labs  Lab 02/13/20 2003 02/14/20 0710  NA 135 138  K 3.4* 3.5  CL 98 99  CO2 26 30  GLUCOSE 131* 88  BUN 16 22  CREATININE 4.01* 4.84*  CALCIUM 7.8* 8.1*  PROT 7.0 6.7  ALBUMIN 2.7* 2.7*  AST 26 21  ALT 10 10  ALKPHOS 94 86  BILITOT 0.9 1.1  GFRNONAA 15* 12*  ANIONGAP 11 9     Hematology Recent Labs  Lab 02/13/20 2003 02/14/20 0710  WBC 9.5 7.9  RBC 2.48* 2.69*  HGB 7.7* 8.3*  HCT 24.4* 25.8*  MCV 98.4 95.9  MCH 31.0 30.9  MCHC 31.6 32.2  RDW 15.4 16.1*  PLT 191 176    Cardiac EnzymesNo results for input(s): TROPONINI in the  last 168 hours. No results for input(s): TROPIPOC in the last 168 hours.   BNPNo results for input(s): BNP, PROBNP in the last 168 hours.   DDimer No results for input(s): DDIMER in the last 168 hours.   Radiology    DG Chest 2 View  Result Date: 02/13/2020 IMPRESSION: 1. Bilateral pleural effusions, moderate to large on the right and small to moderate on the left. Both of these pleural effusions appear to be partially loculated. 2. Cardiomegaly. Electronically Signed   By: Constance Holster M.D.   On: 02/13/2020 20:59   DG Chest Port 1 View  Result Date: 02/15/2020 IMPRESSION: Decreased right effusion after thoracentesis. Negative for pneumothorax. No change in a small left pleural effusion and basilar atelectasis. Cardiomegaly and vascular congestion. Electronically Signed   By: Inge Rise M.D.   On: 02/15/2020 11:24   US THORACENTESIS ASP PLEURAL SPACE W/IMG GUIDE  Result Date: 02/15/2020 IMPRESSION: Successful  ultrasound guided right-sided therapeutic thoracentesis yielding 500 mL of pleural fluid. Read by: Rushie Nyhan, NP Electronically Signed   By: Lucrezia Europe M.D.   On: 02/15/2020 11:37    Cardiac Studies   2D echo 02/15/2020: 1. Left ventricular ejection fraction, by estimation, is 55 to 60%. The  left ventricle has normal function. Left ventricular endocardial border  not optimally defined to evaluate regional wall motion. There is moderate  left ventricular hypertrophy. Left  ventricular diastolic parameters are indeterminate.  2. Right ventricular systolic function is normal. The right ventricular  size is normal. There is mildly elevated pulmonary artery systolic  pressure.  3. Left atrial size was mildly dilated.  4. Right atrial size was mildly dilated.  5. Moderate pericardial effusion. The pericardial effusion is posterior  to the left ventricle.  6. The mitral valve is normal in structure. Mild mitral valve  regurgitation. No evidence of mitral stenosis.  7. The aortic valve was not well visualized. Aortic valve regurgitation  is not visualized. Mild aortic valve stenosis. Aortic valve area, by VTI  measures 1.70 cm. Aortic valve mean gradient measures 5.7 mmHg.  8. The inferior vena cava is dilated in size with >50% respiratory  variability, suggesting right atrial pressure of 8 mmHg.  9. challening image quality. __________  2D echo 12/2019 ([facility]): INTERPRETATION ---------------------------------------------------------------  NORMAL LEFT VENTRICULAR SYSTOLIC FUNCTION WITH MILD LVH  NORMAL LA PRESSURES WITH NORMAL DIASTOLIC FUNCTION  NORMAL RIGHT VENTRICULAR SYSTOLIC FUNCTION  VALVULAR REGURGITATION: MILD AR, MILD MR, TRIVIAL PR, MILD TR  VALVULAR STENOSIS: MODERATE AS  TRIVIAL PERICARDIAL EFFUSION  LIMITED PARASTERNAL WINDOWS.  NO PRIOR STUDY FOR COMPARISON  __________  2D echo 05/2019: 1. Left ventricular ejection fraction, by  estimation, is 60 to 65%. The  left ventricle has normal function. The left ventricle has no regional  wall motion abnormalities. Left ventricular diastolic parameters were  normal.  2. Right ventricular systolic function is normal. The right ventricular  size is normal. There is mildly elevated pulmonary artery systolic  pressure.  3. The mitral valve is degenerative. Mild mitral valve regurgitation. No  evidence of mitral stenosis.  4. The aortic valve is tricuspid. Aortic valve regurgitation is moderate.  Moderate aortic valve stenosis.  5. Distal aortic root measured off axis at 4.5 cm . aortic dilatation  noted. There is mild to moderate dilatation of the aortic root measuring  42 mm.  __________  Nuclear stress test 05/2019:  No T wave inversion was noted during stress.  There was no ST segment deviation noted during  stress.  The study is normal.  This is a low risk study.  The left ventricular ejection fraction is normal (55-65%).   Normal resting and stress perfusion. No ischemia or infarction EF 56%  Patient Profile     79 y.o. male with history of CAD s/p recent 3-vessel CABG in 01/2020 at the [facility] in 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   Assessment & Plan    1. Acute respiratory distress with hypoxia/bilateral pleural effusions: -Status post right-sided thoracentesis with 500 mL of sanguineous fluid removed on 11/8 with noted improvement in symptoms  -No pleural fluid labs/cytology for review, suspected to be transudative  -Cannot exclude some degree of third spacing from hypoalbuminemia  -Wean supplemental oxygen as able -Echo as above -Volume management per HD  2. CAD s/p recent CABG with elevated HS-Tn: -Currently, without chest  pain -Mildly elevated HS-Tn with a peak of 526 and down trending, felt to be related to demand ischemia from dyspnea, underlying multivessel CAD, anemia, and ESRD -Not consistent with ACS -Echo pending, further recommendations pending -ASA -Lipitor  -Metoprolol   3. Severe aortic insufficiency: -Status post AVR in 01/2020 -Echo as above  4. HTN: -Blood pressure is reasonably controlled -Lopressor   5. HLD: -No recent LDL for review -Lipitor -[facility]CP[facility]   6. History of multiple VTE: -PTA Eliquis held for thoracentesis, resume per IM   For questions or updates, please contact [place] Please consult [email or web address] for contact info under Cardiology/STEMI.    Signed, Christell Faith, PA-C [place] Pager: [phone] 02/16/2020, 11:09 AM

## 2020-02-16 NOTE — TOC Initial Note (Signed)
Transition of Care [facility]) - Initial/Assessment Note    Patient Details  Name: Dalton Roy MRN: [id number] Date of Birth: 12-24-1940  [facility] CM/SW Contact:    Candie Chroman, LCSW Phone Number: 02/16/2020, 10:03 AM  Clinical Narrative: Patient will discharge home today after HD. [facility] or home and they said they do not transport from the hospital. Patient will go to HD before he leaves today so [facility] is aware they will need to pick him up from his house on Thursday to take him to HD. Patient will transport home by non-emergency ambulance. Wife has been updated. Her son will be there to assist when he returns home. Patient has a rolling walker, rollator, and bedside commode at home. No further concerns. CSW encouraged patient's wife to contact CSW as needed. CSW will continue to follow patient and his wife for support and facilitate return home today.               Expected Discharge Plan: [place] Services Barriers to Discharge: Other (comment) (HD before discharge.)   Patient Goals and CMS Choice     Choice offered to / list presented to : Patient, Spouse  Expected Discharge Plan and Services Expected Discharge Plan: [place] Acute Care Choice: [place] arrangements for the past 2 months: Single Family Home                           HH Arranged: RN, PT, OT, Social Work [employer] Agency: [place] ([place]) Date [place]: 02/16/20   Representative spoke with at [place]: Floydene Flock  Prior Living Arrangements/Services Living arrangements for the past 2 months: Single Family Home Lives with:: Spouse Patient language and need for interpreter reviewed:: Yes Do you feel safe going back to the place where you live?: Yes      Need for Family Participation in Patient Care: Yes (Comment) Care giver support system in place?: Yes (comment) Current home  services: DME Criminal Activity/Legal Involvement Pertinent to Current Situation/Hospitalization: No - Comment as needed  Activities of Daily Living Home Assistive Devices/Equipment: None ADL Screening (condition at time of admission) Patient's cognitive ability adequate to safely complete daily activities?: Yes Is the patient deaf or have difficulty hearing?: No Does the patient have difficulty seeing, even when wearing glasses/contacts?: No Does the patient have difficulty concentrating, remembering, or making decisions?: No Patient able to express need for assistance with ADLs?: Yes Does the patient have difficulty dressing or bathing?: No Independently performs ADLs?: Yes (appropriate for developmental age) Does the patient have difficulty walking or climbing stairs?: Yes Weakness of Legs: None Weakness of Arms/Hands: None  Permission Sought/Granted Permission sought to share information with : Facility Sport and exercise psychologist, Family Supports Permission granted to share information with : Yes, Verbal Permission Granted  Share Information with NAME: Dalton Roy  Permission granted to share info w AGENCY: [place] granted to share info w Relationship: Wife  Permission granted to share info w Contact Information: [phone]  Emotional Assessment Appearance:: Appears stated age     Orientation: : Oriented to Self, Oriented to Place, Oriented to  Time, Oriented to Situation Alcohol / Substance Use: Not Applicable Psych Involvement: No (comment)  Admission diagnosis:  SOB (shortness of breath) [R06.02] DOE (dyspnea on exertion) [R06.00] Pleural  effusion, bilateral [J90] Pleural effusion, right [J90] Patient Active Problem List   Diagnosis Date Noted  . Aortic valve disease   . Coronary artery disease involving native coronary artery of native heart without angina pectoris   . Pleural effusion, right 02/13/2020  . ESRD (end stage renal disease) ([place])  02/13/2020  . Hypokalemia 02/13/2020  . Troponin I above reference range 02/13/2020  . Symptomatic anemia 02/13/2020  . Acute blood loss anemia 05/29/2019  . Epistaxis, recurrent 05/29/2019  . Syncope 05/23/2019  . SOB (shortness of breath) 05/23/2019  . Near syncope 05/22/2019  . Medicare annual wellness visit, subsequent 08/24/2015  . Arthritis 05/10/2015  . Essential hypertension 01/27/2015  . Gout 01/27/2015  . Frequent headaches 01/27/2015  . Hypothyroidism 01/27/2015  . History of colonic polyps 08/30/2014   PCP:  [place]:   [place] [id number], [place] [address] Phone: [phone] Fax: [place], [place] [address] Phone: [phone] Fax: [phone]     Social Determinants of Health (SDOH) Interventions    Readmission Risk Interventions No flowsheet data found.

## 2020-02-16 NOTE — Progress Notes (Signed)
[facility]  ROUNDING NOTE   Subjective:  Patient was not able to get discharged home yesterday, but primary team planning for discharge today. He is receiving dialysis prior to discharge.   HEMODIALYSIS FLOWSHEET:  Blood Flow Rate (mL/min): 400 mL/min Arterial Pressure (mmHg): -200 mmHg Venous Pressure (mmHg): 150 mmHg Transmembrane Pressure (mmHg): 60 mmHg Ultrafiltration Rate (mL/min): 870 mL/min Dialysate Flow Rate (mL/min): 800 ml/min Conductivity: Machine : 14.1 Conductivity: Machine : 14.1 Dialysis Fluid Bolus: Normal Saline Bolus Amount (mL): 200 mL    Objective:  Vital signs in last 24 hours:  Temp:  [97.5 F (36.4 C)-98.2 F (36.8 C)] 97.6 F (36.4 C) (11/09 0945) Pulse Rate:  [78-87] 79 (11/09 0846) Resp:  [16-22] 22 (11/09 1015) BP: (98-132)/(74-96) 132/96 (11/09 1015) SpO2:  [92 %-97 %] 92 % (11/09 1015) Weight:  [80.5 kg] 80.5 kg (11/09 0500)  Weight change: -15.5 kg Filed Weights   02/14/20 0038 02/15/20 0418 02/16/20 0500  Weight: 95.8 kg 96 kg 80.5 kg    Intake/Output: No intake/output data recorded.   Intake/Output this shift:  No intake/output data recorded.  Physical Exam: General: Resting in bed, receiving dialysis treatment  Head:  Moist oral mucosal membranes  Eyes: Anicteric  Lungs:  Lungs diminished at the bases  Heart: S1S2,no rubs or gallops  Abdomen:  Soft, nontender,non distended  Extremities:  1+ peripheral edema.  Neurologic: Oriented x 3  Skin: No lesions or rashes  Access: Lt IJ catheter    Basic Metabolic Panel: Recent Labs  Lab 02/13/20 2003 02/14/20 0710  NA 135 138  K 3.4* 3.5  CL 98 99  CO2 26 30  GLUCOSE 131* 88  BUN 16 22  CREATININE 4.01* 4.84*  CALCIUM 7.8* 8.1*    Liver Function Tests: Recent Labs  Lab 02/13/20 2003 02/14/20 0710  AST 26 21  ALT 10 10  ALKPHOS 94 86  BILITOT 0.9 1.1  PROT 7.0 6.7  ALBUMIN 2.7* 2.7*   No results for input(s): LIPASE, AMYLASE in the last 168  hours. No results for input(s): AMMONIA in the last 168 hours.  CBC: Recent Labs  Lab 02/13/20 2003 02/14/20 0710  WBC 9.5 7.9  NEUTROABS 4.8  --   HGB 7.7* 8.3*  HCT 24.4* 25.8*  MCV 98.4 95.9  PLT 191 176    Cardiac Enzymes: No results for input(s): CKTOTAL, CKMB, CKMBINDEX, TROPONINI in the last 168 hours.  BNP: Invalid input(s): POCBNP  CBG: No results for input(s): GLUCAP in the last 168 hours.  Microbiology: Results for orders placed or performed during the hospital encounter of 02/13/20  Respiratory Panel by RT PCR (Flu A&B, Covid) - Nasopharyngeal Swab     Status: None   Collection Time: 02/13/20 10:17 PM   Specimen: Nasopharyngeal Swab  Result Value Ref Range Status   SARS Coronavirus 2 by RT PCR NEGATIVE NEGATIVE Final    Comment: (NOTE) SARS-CoV-2 target nucleic acids are NOT DETECTED.  The SARS-CoV-2 RNA is generally detectable in upper respiratoy specimens during the acute phase of infection. The lowest concentration of SARS-CoV-2 viral copies this assay can detect is 131 copies/mL. A negative result does not preclude SARS-Cov-2 infection and should not be used as the sole basis for treatment or other patient management decisions. A negative result may occur with  improper specimen collection/handling, submission of specimen other than nasopharyngeal swab, presence of viral mutation(s) within the areas targeted by this assay, and inadequate number of viral copies (<131 copies/mL). A negative result must  be combined with clinical observations, patient history, and epidemiological information. The expected result is Negative.  Fact Sheet for Patients:  PinkCheek.be  Fact Sheet for Healthcare Providers:  GravelBags.it  This test is no t yet approved or cleared by the [place] FDA and  has been authorized for detection and/or diagnosis of SARS-CoV-2 by FDA under an Emergency Use  Authorization (EUA). This EUA will remain  in effect (meaning this test can be used) for the duration of the COVID-19 declaration under Section 564(b)(1) of the Act, 21 U.S.C. section 360bbb-3(b)(1), unless the authorization is terminated or revoked sooner.     Influenza A by PCR NEGATIVE NEGATIVE Final   Influenza B by PCR NEGATIVE NEGATIVE Final    Comment: (NOTE) The Xpert Xpress SARS-CoV-2/FLU/RSV assay is intended as an aid in  the diagnosis of influenza from Nasopharyngeal swab specimens and  should not be used as a sole basis for treatment. Nasal washings and  aspirates are unacceptable for Xpert Xpress SARS-CoV-2/FLU/RSV  testing.  Fact Sheet for Patients: PinkCheek.be  Fact Sheet for Healthcare Providers: GravelBags.it  This test is not yet approved or cleared by the [place] FDA and  has been authorized for detection and/or diagnosis of SARS-CoV-2 by  FDA under an Emergency Use Authorization (EUA). This EUA will remain  in effect (meaning this test can be used) for the duration of the  Covid-19 declaration under Section 564(b)(1) of the Act, 21  U.S.C. section 360bbb-3(b)(1), unless the authorization is  terminated or revoked. Performed at [facility], [place], [address] [place], [place] [address]     Coagulation Studies: No results for input(s): LABPROT, INR in the last 72 hours.  Urinalysis: No results for input(s): COLORURINE, LABSPEC, PHURINE, GLUCOSEU, HGBUR, BILIRUBINUR, KETONESUR, PROTEINUR, UROBILINOGEN, NITRITE, LEUKOCYTESUR in the last 72 hours.  Invalid input(s): APPERANCEUR    Imaging: CT CHEST WO CONTRAST  Result Date: 02/15/2020 CLINICAL DATA:  79 year old male status post ultrasound-guided right side thoracentesis today. Recent 3 vessel CABG, aortic valve replacement last month at [facility] in [place]. End stage renal disease. EXAM: CT CHEST WITHOUT CONTRAST TECHNIQUE:  Multidetector CT imaging of the chest was performed following the standard protocol without IV contrast. COMPARISON:  Portable chest earlier today. Chest radiographs 02/13/2020. Non-contrast CT Abdomen and Pelvis 06/10/2006. FINDINGS: Cardiovascular: Cardiomegaly is new since 2008. Small volume pericardial effusion with complex fluid density (30 Hounsfield units). Sequelae of CABG and aortic valve replacement. Mediastinum/Nodes: Recent postoperative changes to the sternum. Trace postoperative mediastinal hematoma. No lymphadenopathy. Lungs/Pleura: No pneumothorax.  Major airways are patent. Small to moderate residual right pleural effusion which is mostly layering, and averages simple fluid density (series 2, image 80) although there are some subtle areas of hyperdense foci within the effusion (series 2, image 122). Superimposed compressive right lung atelectasis. Calcified right lower lobe granuloma. Noncalcified right middle lobe lung nodule measuring 6 mm on series 4, image 81 at a level which was not included on the 2008 CT. More lobulated left pleural effusion which partially tracks within the left major fissure, averages simple fluid density but has more areas of hyperdensity at the costophrenic angle than on the right side (series 2 image 125). Compressive left lung atelectasis. Upper Abdomen: Cholelithiasis. No pericholecystic inflammation. Negative visible noncontrast liver, spleen, adrenal glands and bowel in the upper abdomen. Dystrophic calcifications of the pancreas are new since 2008. 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 The largest bilateral cysts have simple fluid density. Calcified aortic  atherosclerosis. Musculoskeletal: Unhealed sternotomy. Sternotomy wires appear intact. Benign bone island suspected in the left lateral 7th rib. No acute or suspicious osseous lesion identified. IMPRESSION: 1. Recent CABG and aortic valve replacement with trace postoperative  hematoma in the mediastinum and trace hemopericardium suspected. 2. No pneumothorax following thoracentesis. Bilateral pleural effusions with predominantly simple fluid density although there are several nonspecific hyperdense areas which are nonspecific but might be clotted blood. More of the left pleural effusion tracks along the major fissure. Bilateral compressive atelectasis. 3. Noncalcified 6 mm right middle lobe lung nodule. Non-contrast chest CT at 6-12 months is recommended. If the nodule is stable at time of repeat CT, then future CT at 18-24 months (from today's scan) is considered optional for low-risk patients, but is recommended for high-risk patients. This recommendation follows the consensus statement: Guidelines for Management of Incidental Pulmonary Nodules Detected on CT Images: From the Fleischner Society 2017; Radiology 2017; 284:228-243. 4. Cholelithiasis. Chronic polycystic renal disease. Aortic Atherosclerosis (ICD10-I70.0). Electronically Signed   By: Genevie Ann M.D.   On: 02/15/2020 16:48   DG Chest Port 1 View  Result Date: 02/15/2020 CLINICAL DATA:  Patient status post right thoracentesis today. EXAM: PORTABLE CHEST 1 VIEW COMPARISON:  PA and lateral chest 02/13/2020. FINDINGS: Right pleural effusion is decreased after thoracentesis. No pneumothorax. Small left effusion is unchanged. There is cardiomegaly and vascular congestion. Aortic atherosclerosis is noted. The patient is status post CABG and aortic valve replacement. Dialysis catheter is in place. IMPRESSION: Decreased right effusion after thoracentesis. Negative for pneumothorax. No change in a small left pleural effusion and basilar atelectasis. Cardiomegaly and vascular congestion. Electronically Signed   By: Inge Rise M.D.   On: 02/15/2020 11:24   ECHOCARDIOGRAM COMPLETE  Result Date: 02/15/2020    ECHOCARDIOGRAM REPORT   Patient Name:   Dalton Roy Date of Exam: 02/15/2020 Medical Rec #:  [id number]      Height:        70.0 in Accession #:    [id number]     Weight:       211.6 lb Date of Birth:  10-13-1940      BSA:          2.138 m Patient Age:    40 years       BP:           153/95 mmHg Patient Gender: M              HR:           93 bpm. Exam Location:  [facility] Procedure: 2D Echo, Cardiac Doppler and Color Doppler Indications:     Dyspnea 786.09  History:         Patient has prior history of Echocardiogram examinations, most                  recent 05/22/2019. Prior CABG, Signs/Symptoms:Murmur; Risk                  Factors:Hypertension. Bipolar disorder 1.  Sonographer:     Sherrie Sport RDCS (AE) Referring Phys:  [id number] Kate Sable Diagnosing Phys: Kathlyn Sacramento MD IMPRESSIONS  1. Left ventricular ejection fraction, by estimation, is 55 to 60%. The left ventricle has normal function. Left ventricular endocardial border not optimally defined to evaluate regional wall motion. There is moderate left ventricular hypertrophy. Left ventricular diastolic parameters are indeterminate.  2. Right ventricular systolic function is normal. The right ventricular size is normal. There is mildly elevated pulmonary artery  systolic pressure.  3. Left atrial size was mildly dilated.  4. Right atrial size was mildly dilated.  5. Moderate pericardial effusion. The pericardial effusion is posterior to the left ventricle.  6. The mitral valve is normal in structure. Mild mitral valve regurgitation. No evidence of mitral stenosis.  7. The aortic valve was not well visualized. Aortic valve regurgitation is not visualized. Mild aortic valve stenosis. Aortic valve area, by VTI measures 1.70 cm. Aortic valve mean gradient measures 5.7 mmHg.  8. The inferior vena cava is dilated in size with >50% respiratory variability, suggesting right atrial pressure of 8 mmHg.  9. challening image quality. FINDINGS  Left Ventricle: Left ventricular ejection fraction, by estimation, is 55 to 60%. The left ventricle has normal function. Left ventricular  endocardial border not optimally defined to evaluate regional wall motion. The left ventricular internal cavity size was normal in size. There is moderate left ventricular hypertrophy. Left ventricular diastolic parameters are indeterminate. Right Ventricle: The right ventricular size is normal. No increase in right ventricular wall thickness. Right ventricular systolic function is normal. There is mildly elevated pulmonary artery systolic pressure. The tricuspid regurgitant velocity is 2.89  m/s, and with an assumed right atrial pressure of 8 mmHg, the estimated right ventricular systolic pressure is 32.1 mmHg. Left Atrium: Left atrial size was mildly dilated. Right Atrium: Right atrial size was mildly dilated. Pericardium: A moderately sized pericardial effusion is present. The pericardial effusion is posterior to the left ventricle. Mitral Valve: The mitral valve is normal in structure. Mild mitral valve regurgitation. No evidence of mitral valve stenosis. Tricuspid Valve: The tricuspid valve is normal in structure. Tricuspid valve regurgitation is mild . No evidence of tricuspid stenosis. Aortic Valve: The aortic valve was not well visualized. Aortic valve regurgitation is not visualized. Mild aortic stenosis is present. Aortic valve mean gradient measures 5.7 mmHg. Aortic valve peak gradient measures 10.8 mmHg. Aortic valve area, by VTI measures 1.70 cm. Pulmonic Valve: The pulmonic valve was normal in structure. Pulmonic valve regurgitation is not visualized. No evidence of pulmonic stenosis. Aorta: The aortic root is normal in size and structure. Venous: The inferior vena cava is dilated in size with greater than 50% respiratory variability, suggesting right atrial pressure of 8 mmHg. IAS/Shunts: No atrial level shunt detected by color flow Doppler.  LEFT VENTRICLE PLAX 2D LVIDd:         3.53 cm  Diastology LVIDs:         2.60 cm  LV e' medial:    12.10 cm/s LV PW:         1.42 cm  LV E/e' medial:  7.8 LV  IVS:        1.21 cm  LV e' lateral:   5.87 cm/s LVOT diam:     2.00 cm  LV E/e' lateral: 16.1 LV SV:         36 LV SV Index:   17 LVOT Area:     3.14 cm  RIGHT VENTRICLE RV Basal diam:  3.81 cm RV S prime:     11.60 cm/s LEFT ATRIUM             Index       RIGHT ATRIUM           Index LA diam:        4.80 cm 2.24 cm/m  RA Area:     20.70 cm LA Vol (A2C):   67.4 ml 31.52 ml/m RA Volume:   51.40 ml  24.04 ml/m LA Vol (A4C):   76.9 ml 35.97 ml/m LA Biplane Vol: 73.9 ml 34.56 ml/m  AORTIC VALVE                    PULMONIC VALVE AV Area (Vmax):    1.41 cm     PV Vmax:        1.00 m/s AV Area (Vmean):   1.43 cm     PV Peak grad:   4.0 mmHg AV Area (VTI):     1.70 cm     RVOT Peak grad: 5 mmHg AV Vmax:           164.33 cm/s AV Vmean:          110.333 cm/s AV VTI:            0.211 m AV Peak Grad:      10.8 mmHg AV Mean Grad:      5.7 mmHg LVOT Vmax:         73.80 cm/s LVOT Vmean:        50.300 cm/s LVOT VTI:          0.114 m LVOT/AV VTI ratio: 0.54  AORTA Ao Root diam: 3.00 cm MITRAL VALVE               TRICUSPID VALVE MV Area (PHT): 4.21 cm    TR Peak grad:   33.4 mmHg MV Decel Time: 180 msec    TR Vmax:        289.00 cm/s MV E velocity: 94.30 cm/s MV A velocity: 94.30 cm/s  SHUNTS MV E/A ratio:  1.00        Systemic VTI:  0.11 m                            Systemic Diam: 2.00 cm Kathlyn Sacramento MD Electronically signed by Kathlyn Sacramento MD Signature Date/Time: 02/15/2020/2:24:49 PM    Final    US THORACENTESIS ASP PLEURAL SPACE W/IMG GUIDE  Result Date: 02/15/2020 INDICATION: Patient history of coronary artery disease status post CABG with dyspnea found to have a bilateral pleural effusions. Request is for therapeutic right-sided thoracentesis EXAM: ULTRASOUND GUIDED THERAPEUTIC THORACENTESIS MEDICATIONS: Lidocaine 1% 10 mL COMPLICATIONS: None immediate. PROCEDURE: An ultrasound guided thoracentesis was thoroughly discussed with the patient and questions answered. The benefits, risks, alternatives and  complications were also discussed. The patient understands and wishes to proceed with the procedure. Written consent was obtained. Ultrasound performed showed a loculated right-sided pleural effusion. The largest pocket was localized and marked in the right sided chest. The area was then prepped and draped in the normal sterile fashion. 1% Lidocaine was used for local anesthesia. Under ultrasound guidance a 6 Fr Safe-T-Centesis catheter was introduced. Thoracentesis was performed. The catheter was removed and a dressing applied. FINDINGS: A total of approximately 500 mL of sanguineous fluid was removed. IMPRESSION: Successful ultrasound guided right-sided therapeutic thoracentesis yielding 500 mL of pleural fluid. Read by: Rushie Nyhan, NP Electronically Signed   By: Lucrezia Europe M.D.   On: 02/15/2020 11:37     Medications:   . sodium chloride     . sodium chloride   Intravenous Once  . allopurinol  100 mg Oral Once per day on Mon Wed Fri  . apixaban  5 mg Oral BID  . aspirin EC  81 mg Oral QHS  . atorvastatin  20 mg Oral QHS  . Chlorhexidine Gluconate Cloth  6  each Topical Q0600  . escitalopram  5 mg Oral Daily  . ferrous sulfate  325 mg Oral QHS  . gabapentin  200 mg Oral Q breakfast  . gabapentin  300 mg Oral Q supper  . levothyroxine  75 mcg Oral Q0600  . melatonin  5 mg Oral QHS  . metoprolol tartrate  12.5 mg Oral BID  . pantoprazole  40 mg Oral Daily  . sodium chloride flush  3 mL Intravenous Q12H  . torsemide  60 mg Oral Daily  . traZODone  50 mg Oral QHS   sodium chloride, acetaminophen **OR** acetaminophen, calcium carbonate (dosed in mg elemental calcium), camphor-menthol **AND** hydrOXYzine, docusate sodium, feeding supplement (NEPRO CARB STEADY), ondansetron **OR** ondansetron (ZOFRAN) IV, sodium chloride flush, sorbitol, traMADol, zolpidem  Assessment/ Plan:  Mr. Dalton Roy is a 79 y.o.  male  Recent CABG 4 weeks ago, complicated with ESRD,currently on HD MWF  schedule, HTN, bipolar disorder, who was admitted to [facility] on 02/13/2020 for evaluation of worsening SOB.Patient found to have bilateral pleural effusions.Thoracentesis with 500 ml of fluid removal from Rt.Side today. #ESRD on dialysis TTS #Volume overload Patient is receiving dialysis today prior to discharge Tolerating well He will continue TTS schedule as outpatient  #Bilateral pleural effusions  Rt Thoracentesis on 02/16/2020 Reports feeling better CT Scan post thoracentesis shows no pneumothorax  #Anemia with CKD Hemoglobin 8.3 on 02/14/2020  Epogen per outpatient dialysis protocol  #Secondary Hyperparathyroidism Continue management as outpatient   LOS: 3 Terry Bolotin 11/9/202112:39 PM

## 2020-02-16 NOTE — TOC Transition Note (Signed)
Transition of Care [facility]) - CM/SW Discharge Note   Patient Details  Name: GARDY MONTANARI MRN: [id number] Date of Birth: 1941-02-09  [facility] CM/SW Contact:  Candie Chroman, LCSW Phone Number: 02/16/2020, 2:53 PM   Clinical Narrative: Patient has orders to discharge home today. He was wearing oxygen during conversation but said he does not use it at home and is able to bring his sats back up on his own when he takes a few deep breaths. EMS transport has been set up and he is first on the list. No further concerns. CSW signing off.    Final next level of care: [place] Barriers to Discharge: Barriers Resolved   Patient Goals and CMS Choice     Choice offered to / list presented to : Patient, Spouse  Discharge Placement                Patient to be transferred to facility by: EMS Name of family member notified: Gal Feldhaus Patient and family notified of of transfer: 02/16/20  Discharge Plan and Services     Post Acute Care Choice: Home Health                    HH Arranged: RN, PT, OT, Social Work [facility] Agency: [place] [facility] Date [place]: 02/16/20   Representative spoke with at [place]: Floydene Flock  Social Determinants of Health (SDOH) Interventions     Readmission Risk Interventions No flowsheet data found.

## 2020-02-17 LAB — HEPATITIS B DNA, ULTRAQUANTITATIVE, PCR
HBV DNA SERPL PCR-ACNC: NOT DETECTED IU/mL
HBV DNA SERPL PCR-LOG IU: UNDETERMINED log10 IU/mL

## 2020-02-17 LAB — HEPATITIS B SURFACE ANTIBODY, QUANTITATIVE: Hep B S AB Quant (Post): <3.1 m[IU]/mL — ABNORMAL LOW (ref >9.9)

## 2020-03-12 ENCOUNTER — Other Ambulatory Visit: Payer: Self-pay

## 2020-03-12 ENCOUNTER — Encounter: Payer: Self-pay | Admitting: Emergency Medicine

## 2020-03-12 DIAGNOSIS — R040 Epistaxis: Secondary | ICD-10-CM | POA: Diagnosis present

## 2020-03-12 DIAGNOSIS — Z5321 Procedure and treatment not carried out due to patient leaving prior to being seen by health care provider: Secondary | ICD-10-CM | POA: Insufficient documentation

## 2020-03-12 NOTE — ED Triage Notes (Signed)
Patient brought in by ems from home. Patient with a nose bleed off and on times three hours. Nose clamp placed in triage. Patient takes apixaban.

## 2020-03-13 ENCOUNTER — Emergency Department
Admission: EM | Admit: 2020-03-13 | Discharge: 2020-03-13 | Disposition: A | Discharge status: Home / Self Care | Payer: No Typology Code available for payment source | Attending: Emergency Medicine | Admitting: Emergency Medicine

## 2020-03-13 NOTE — ED Notes (Signed)
Patient with no active bleeding at this time. Patient states that he is not feeling well. Vital signs stable. Patient requesting for somewhere to lay down. Patient informed that there is no beds available at this time.

## 2020-03-19 ENCOUNTER — Encounter (HOSPITAL_COMMUNITY): Payer: Self-pay

## 2020-03-19 ENCOUNTER — Emergency Department (HOSPITAL_COMMUNITY)
Admission: EM | Admit: 2020-03-19 | Discharge: 2020-03-19 | Disposition: A | Discharge status: Home / Self Care | Payer: No Typology Code available for payment source | Attending: Emergency Medicine | Admitting: Emergency Medicine

## 2020-03-19 ENCOUNTER — Other Ambulatory Visit: Payer: Self-pay

## 2020-03-19 DIAGNOSIS — N186 End stage renal disease: Secondary | ICD-10-CM | POA: Diagnosis not present

## 2020-03-19 DIAGNOSIS — Z992 Dependence on renal dialysis: Secondary | ICD-10-CM | POA: Insufficient documentation

## 2020-03-19 DIAGNOSIS — Z87891 Personal history of nicotine dependence: Secondary | ICD-10-CM | POA: Insufficient documentation

## 2020-03-19 DIAGNOSIS — R040 Epistaxis: Secondary | ICD-10-CM | POA: Insufficient documentation

## 2020-03-19 DIAGNOSIS — E039 Hypothyroidism, unspecified: Secondary | ICD-10-CM | POA: Diagnosis not present

## 2020-03-19 DIAGNOSIS — I2510 Atherosclerotic heart disease of native coronary artery without angina pectoris: Secondary | ICD-10-CM | POA: Insufficient documentation

## 2020-03-19 DIAGNOSIS — I120 Hypertensive chronic kidney disease with stage 5 chronic kidney disease or end stage renal disease: Secondary | ICD-10-CM | POA: Diagnosis not present

## 2020-03-19 DIAGNOSIS — Z85828 Personal history of other malignant neoplasm of skin: Secondary | ICD-10-CM | POA: Diagnosis not present

## 2020-03-19 DIAGNOSIS — I951 Orthostatic hypotension: Secondary | ICD-10-CM | POA: Insufficient documentation

## 2020-03-19 LAB — CBC WITH DIFFERENTIAL/PLATELET
Abs Immature Granulocytes: 0.03 10*3/uL (ref 0.00–0.07)
Basophils Absolute: 0.1 10*3/uL (ref 0.0–0.1)
Basophils Relative: 1 %
Eosinophils Absolute: 0.3 10*3/uL (ref 0.0–0.5)
Eosinophils Relative: 3 %
HCT: 28.4 % — ABNORMAL LOW (ref 39.0–52.0)
Hemoglobin: 8.6 g/dL — ABNORMAL LOW (ref 13.0–17.0)
Immature Granulocytes: 0 %
Lymphocytes Relative: 29 %
Lymphs Abs: 2.5 10*3/uL (ref 0.7–4.0)
MCH: 29.6 pg (ref 26.0–34.0)
MCHC: 30.3 g/dL (ref 30.0–36.0)
MCV: 97.6 fL (ref 80.0–100.0)
Monocytes Absolute: 0.6 10*3/uL (ref 0.1–1.0)
Monocytes Relative: 6 %
Neutro Abs: 5.3 10*3/uL (ref 1.7–7.7)
Neutrophils Relative %: 61 %
Platelets: 222 10*3/uL (ref 150–400)
RBC: 2.91 MIL/uL — ABNORMAL LOW (ref 4.22–5.81)
RDW: 15 % (ref 11.5–15.5)
WBC: 8.7 10*3/uL (ref 4.0–10.5)
nRBC: 0 % (ref 0.0–0.2)

## 2020-03-19 LAB — COMPREHENSIVE METABOLIC PANEL
ALT: 9 U/L (ref 0–44)
AST: 19 U/L (ref 15–41)
Albumin: 2.2 g/dL — ABNORMAL LOW (ref 3.5–5.0)
Alkaline Phosphatase: 94 U/L (ref 38–126)
Anion gap: 11 (ref 5–15)
BUN: 19 mg/dL (ref 8–23)
CO2: 27 mmol/L (ref 22–32)
Calcium: 8.3 mg/dL — ABNORMAL LOW (ref 8.9–10.3)
Chloride: 98 mmol/L (ref 98–111)
Creatinine, Ser: 2.83 mg/dL — ABNORMAL HIGH (ref 0.61–1.24)
GFR, Estimated: 22 mL/min — ABNORMAL LOW (ref >60)
Glucose, Bld: 99 mg/dL (ref 70–99)
Potassium: 3.6 mmol/L (ref 3.5–5.1)
Sodium: 136 mmol/L (ref 135–145)
Total Bilirubin: 0.7 mg/dL (ref 0.3–1.2)
Total Protein: 6.4 g/dL — ABNORMAL LOW (ref 6.5–8.1)

## 2020-03-19 LAB — PROTIME-INR
INR: 1.7 — ABNORMAL HIGH (ref 0.8–1.2)
Prothrombin Time: 19.3 seconds — ABNORMAL HIGH (ref 11.4–15.2)

## 2020-03-19 LAB — HEMOGLOBIN AND HEMATOCRIT, BLOOD
HCT: 27.2 % — ABNORMAL LOW (ref 39.0–52.0)
Hemoglobin: 8.8 g/dL — ABNORMAL LOW (ref 13.0–17.0)

## 2020-03-19 LAB — TYPE AND SCREEN
ABO/RH(D): A POS
Antibody Screen: NEGATIVE

## 2020-03-19 MED ORDER — SODIUM CHLORIDE 0.9 % IV BOLUS
250.0000 mL | Freq: Once | INTRAVENOUS | Status: AC
  Administered 2020-03-19: 08:00:00 250 mL via INTRAVENOUS

## 2020-03-19 MED ORDER — SODIUM CHLORIDE 0.9 % IV BOLUS
250.0000 mL | Freq: Once | INTRAVENOUS | Status: AC
  Administered 2020-03-19: 11:00:00 250 mL via INTRAVENOUS

## 2020-03-19 NOTE — ED Notes (Signed)
Dr long at bedside started US guided IV

## 2020-03-19 NOTE — Discharge Instructions (Signed)
You were seen in the emergency department with bleeding from the nose.  We placed a nasal packing which will need to stay in place.  Please call the ear nose and throat doctor listed on Monday to schedule a follow-up appointment where they can remove the packing.  If you develop return of nosebleeds or lightheadedness, passing out, other sudden severe symptoms you should return to the emergency department.

## 2020-03-19 NOTE — ED Triage Notes (Signed)
Pt from home with ems for nose bleed that started around 4am this morning, pt is on a blood thinner. EMS reports pt bled through several rounds of gauze. Initial SBP 112, upon arrival to ED, SBP 70s. Pt drowsy upon arrival to ED.

## 2020-03-19 NOTE — ED Provider Notes (Signed)
Emergency Department Provider Note   I have reviewed the triage vital signs and the nursing notes.   HISTORY  Chief Complaint Epistaxis and Hypotension   HPI Dalton Roy is a 79 y.o. male with PMH reviewed below including anticoagulation presents to the ED with severe epistaxis and hypotension.  Patient had spontaneous epistaxis starting at 4 AM.  EMS arrived on scene to find the patient having large-volume epistaxis from the right nostril.  Patient feels blood running down his throat.  He denies any chest pain or shortness of breath.  Is not having abdominal or back pain.  He is feeling drowsy. No radiation of symptoms or modifying factors.   Past Medical History:  Diagnosis Date  . Bipolar 1 disorder ([place])   . Cancer ([place]) 20 years   skin  . Gout   . Heart murmur   . Hypertension   . Hypothyroidism   . ITP (idiopathic thrombocytopenic purpura)   . Migraines   . Psoriasis     Patient Active Problem List   Diagnosis Date Noted  . Aortic valve disease   . Coronary artery disease involving native coronary artery of native heart without angina pectoris   . Pleural effusion, right 02/13/2020  . ESRD (end stage renal disease) ([place]) 02/13/2020  . Hypokalemia 02/13/2020  . Troponin I above reference range 02/13/2020  . Symptomatic anemia 02/13/2020  . Acute blood loss anemia 05/29/2019  . Epistaxis, recurrent 05/29/2019  . Syncope 05/23/2019  . SOB (shortness of breath) 05/23/2019  . Near syncope 05/22/2019  . Medicare annual wellness visit, subsequent 08/24/2015  . Arthritis 05/10/2015  . Essential hypertension 01/27/2015  . Gout 01/27/2015  . Frequent headaches 01/27/2015  . Hypothyroidism 01/27/2015  . History of colonic polyps 08/30/2014    Past Surgical History:  Procedure Laterality Date  . CARDIAC SURGERY    . CHOLECYSTECTOMY    . COLONOSCOPY N/A 08/31/2014   Procedure: COLONOSCOPY;  Surgeon: Robert Bellow, MD;  Location: [facility] ENDOSCOPY;  Service:  Endoscopy;  Laterality: N/A;  . COLONOSCOPY W/ POLYPECTOMY  09/12/2007   Tubular adenoma from the proximal transverse colon without atypia.  [facility] [place]  2001  . LITHOTRIPSY    . MELANOMA EXCISION  1998    Allergies Codeine  Family History  Problem Relation Age of Onset  . Colon cancer Brother        Half brother  . Cancer Father        bladder cancer    Social History Social History   Tobacco Use  . Smoking status: Former Smoker    Types: Cigarettes    Quit date: 04/09/1966    Years since quitting: 53.9  . Smokeless tobacco: Former Network engineer Use Topics  . Alcohol use: Not Currently    Alcohol/week: 0.0 standard drinks  . Drug use: No    Review of Systems  Constitutional: No fever/chills. Positive fatigue.  Eyes: No visual changes. ENT: No sore throat. Epistaxis from right nostril. No visible source of bleeding.  Cardiovascular: Denies chest pain. HD cath left chest.  Respiratory: Denies shortness of breath. Gastrointestinal: No abdominal pain.  No nausea, no vomiting.  No diarrhea.  No constipation. Genitourinary: Negative for dysuria. Musculoskeletal: Negative for back pain. Skin: Negative for rash. Neurological: Negative for headaches, focal weakness or numbness.  10-point ROS otherwise negative.  ____________________________________________   PHYSICAL EXAM:  VITAL SIGNS: ED Triage Vitals  Enc Vitals Group     BP 03/19/20 0715 (!) 87/68  Pulse Rate 03/19/20 0715 76     Resp 03/19/20 0715 20     Temp 03/19/20 0715 (!) 97.2 F (36.2 C)     Temp Source 03/19/20 0715 Axillary     SpO2 03/19/20 0715 99 %     Weight 03/19/20 0722 143 lb 15.4 oz (65.3 kg)     Height 03/19/20 0722 $RemoveBefor'5\' 10"'[id number]$  (1.778 m)    Constitutional: Alert and oriented. Well appearing and in no acute distress. Eyes: Conjunctivae are normal.  Head: Atraumatic. Nose: No congestion/rhinnorhea. Clot in the right nostril with oozing blood. No visible source of bleeding.   Mouth/Throat: Mucous membranes are moist. Neck: No stridor.  Cardiovascular: Normal rate, regular rhythm. Good peripheral circulation. Grossly normal heart sounds.   Respiratory: Normal respiratory effort.  No retractions. Lungs CTAB. Gastrointestinal: Soft and nontender. No distention.  Musculoskeletal: No gross deformities of extremities. Neurologic:  Normal speech and language. Skin:  Skin is warm, dry and intact. No rash noted.  ____________________________________________   LABS (all labs ordered are listed, but only abnormal results are displayed)  Labs Reviewed  COMPREHENSIVE METABOLIC PANEL - Abnormal; Notable for the following components:      Result Value   Creatinine, Ser 2.83 (*)    Calcium 8.3 (*)    Total Protein 6.4 (*)    Albumin 2.2 (*)    GFR, Estimated 22 (*)    All other components within normal limits  CBC WITH DIFFERENTIAL/PLATELET - Abnormal; Notable for the following components:   RBC 2.91 (*)    Hemoglobin 8.6 (*)    HCT 28.4 (*)    All other components within normal limits  PROTIME-INR - Abnormal; Notable for the following components:   Prothrombin Time 19.3 (*)    INR 1.7 (*)    All other components within normal limits  HEMOGLOBIN AND HEMATOCRIT, BLOOD - Abnormal; Notable for the following components:   Hemoglobin 8.8 (*)    HCT 27.2 (*)    All other components within normal limits  TYPE AND SCREEN   ____________________________________________   PROCEDURES  Procedure(s) performed:   .Critical Care Performed by: Margette Fast, MD Authorized by: Margette Fast, MD   Critical care provider statement:    Critical care time (minutes):  35   Critical care time was exclusive of:  Separately billable procedures and treating other patients and teaching time   Critical care was necessary to treat or prevent imminent or life-threatening deterioration of the following conditions:  Circulatory failure and shock   Critical care was time spent  personally by me on the following activities:  Discussions with consultants, evaluation of patient's response to treatment, examination of patient, ordering and performing treatments and interventions, ordering and review of laboratory studies, ordering and review of radiographic studies, pulse oximetry, re-evaluation of patient's condition, obtaining history from patient or surrogate, review of old charts, blood draw for specimens and development of treatment plan with patient or surrogate   I assumed direction of critical care for this patient from another provider in my specialty: no   .Epistaxis Management  Date/Time: 03/19/2020 7:48 AM Performed by: Margette Fast, MD Authorized by: Margette Fast, MD   Consent:    Consent obtained:  Emergent situation   Consent given by:  Patient   Risks, benefits, and alternatives were discussed: yes     Risks discussed:  Bleeding, nasal injury, pain and infection   Alternatives discussed:  No treatment Universal protocol:    Patient identity confirmed:  Verbally with patient Anesthesia:    Anesthesia method:  None Procedure details:    Treatment site:  R anterior   Treatment method:  Anterior pack   Treatment complexity:  Limited   Treatment episode: initial   Post-procedure details:    Assessment:  Bleeding stopped   Procedure completion:  Tolerated   Emergency Ultrasound Study:   Angiocath insertion Performed by: Margette Fast  Consent: Verbal consent obtained. Risks and benefits: risks, benefits and alternatives were discussed Immediately prior to procedure the correct patient, procedure, equipment, support staff and site/side marked as needed.  Indication: difficult IV access Preparation: Patient was prepped and draped in the usual sterile fashion. Vein Location: Left AC was visualized during assessment for potential access sites and was found to be patent/ easily compressed with linear ultrasound.  The needle was visualized with  real-time ultrasound and guided into the vein. Gauge: 20   Normal blood return.  Patient tolerance: Patient tolerated the procedure well with no immediate complications. ____________________________________________   INITIAL IMPRESSION / ASSESSMENT AND PLAN / ED COURSE  Pertinent labs & imaging results that were available during my care of the patient were reviewed by me and considered in my medical decision making (see chart for details).   Patient presents the emergency department for evaluation of epistaxis and hypotension.  He is a dialysis patient and also anticoagulated on Eliquis.  I placed a rapid rhino anterior pack as described above with hemostasis achieved.  Patient given 250 mL fluid bolus.  He does not appear volume overloaded clinically.  Given the amount of blood loss suspect hemorrhagic shock as opposed to septic or cardiogenic shock.  Have sent type and screen along with labs and will monitor patient's blood pressures.   08:55 AM  BP has normalized and patient feeling improved. No breakthrough bleeding. H/H stable compared to prior values here. With hypotension earlier with observe and repeat H/H. No emergent indication for HD.  01:30 PM  Repeat hemoglobin and hematocrit essentially unchanged.  Patient initially had hypotension with standing but his orthostatic vital signs have improved.  He is not having symptoms with standing and is up and walking around without symptoms.  He has had prolonged observation here in the emergency department with no return of hypertension.  No return of epistaxis.  Have given contact information for ENT and patient to call on Monday to schedule follow-up appointment to have nasal packing removed in the office.  Discussed ED return precautions in detail.  Patient is comfortable with the plan at discharge. ____________________________________________  FINAL CLINICAL IMPRESSION(S) / ED DIAGNOSES  Final diagnoses:  Epistaxis  Orthostatic  hypotension    MEDICATIONS GIVEN DURING THIS VISIT:  Medications  sodium chloride 0.9 % bolus 250 mL (0 mLs Intravenous Stopped 03/19/20 0749)  sodium chloride 0.9 % bolus 250 mL (0 mLs Intravenous Stopped 03/19/20 1109)    Note:  This document was prepared using Dragon voice recognition software and may include unintentional dictation errors.  Nanda Quinton, MD, [facility] Emergency Medicine    Jaculin Rasmus, [facility], MD 03/19/20 [phone]

## 2020-07-26 ENCOUNTER — Ambulatory Visit (INDEPENDENT_AMBULATORY_CARE_PROVIDER_SITE_OTHER): Payer: Medicare Other | Admitting: Podiatry

## 2020-07-26 ENCOUNTER — Encounter: Payer: Self-pay | Admitting: Podiatry

## 2020-07-26 ENCOUNTER — Other Ambulatory Visit: Payer: Self-pay

## 2020-07-26 DIAGNOSIS — L600 Ingrowing nail: Secondary | ICD-10-CM | POA: Diagnosis not present

## 2020-07-26 DIAGNOSIS — L603 Nail dystrophy: Secondary | ICD-10-CM

## 2020-07-26 DIAGNOSIS — L03031 Cellulitis of right toe: Secondary | ICD-10-CM | POA: Diagnosis not present

## 2020-07-26 MED ORDER — DOXYCYCLINE HYCLATE 100 MG PO TABS: 100.0000 mg | ORAL_TABLET | Freq: Two times a day (BID) | ORAL | 0 refills | Status: DC

## 2020-07-26 NOTE — Patient Instructions (Signed)
Soak Instructions    THE DAY AFTER THE PROCEDURE  Place 1/4 cup of epsom salts or betadine in a quart of warm tap water.  Submerge your foot or feet with outer bandage intact for the initial soak; this will allow the bandage to become moist and wet for easy lift off.  Once you remove your bandage, continue to soak in the solution for 20 minutes.  This soak should be done twice a day.  Next, remove your foot or feet from solution, blot dry the affected area and cover.  You may use a band aid large enough to cover the area or use gauze and tape.  Apply other medications to the area as directed by the doctor such as polysporin neosporin.  IF YOUR SKIN BECOMES IRRITATED WHILE USING THESE INSTRUCTIONS, IT IS OKAY TO SWITCH TO  WHITE VINEGAR AND WATER. Or you may use antibacterial soap and water to keep the toe clean  Monitor for any signs/symptoms of infection. Call the office immediately if any occur or go directly to the emergency room. Call with any questions/concerns.    [place] Instructions-Post Nail Surgery  You have had your ingrown toenail and root treated with a chemical.  This chemical causes a burn that will drain and ooze like a blister.  This can drain for 6-8 weeks or longer.  It is important to keep this area clean, covered, and follow the soaking instructions dispensed at the time of your surgery.  This area will eventually dry and form a scab.  Once the scab forms you no longer need to soak or apply a dressing.  If at any time you experience an increase in pain, redness, swelling, or drainage, you should contact the office as soon as possible.

## 2020-07-27 ENCOUNTER — Encounter: Payer: Self-pay | Admitting: Podiatry

## 2020-07-27 NOTE — Progress Notes (Signed)
Subjective:  Patient ID: Dalton Roy, male    DOB: Feb 13, 1941,  MRN: [id number]  Chief Complaint  Patient presents with  . Nail Problem    Patient presents today for ingrown toenail right hallux lateral border x 2 weeks    80 y.o. male presents with the above complaint.  Patient presents with right hallux lateral border ingrown.  Patient is being of 2 weeks there is some pus purulent drainage some redness associated with it.  He has not been taking antibiotics.  He would like to have it removed.  He usually does self debridement.  He denies any other acute complaints.  There is dystrophic nature to the nail as well.   Review of Systems: Negative except as noted in the HPI. Denies N/V/F/Ch.  Past Medical History:  Diagnosis Date  . Bipolar 1 disorder ([place])   . Cancer ([place]) 20 years   skin  . Gout   . Heart murmur   . Hypertension   . Hypothyroidism   . ITP (idiopathic thrombocytopenic purpura)   . Migraines   . Psoriasis     Current Outpatient Medications:  .  doxycycline (VIBRA-TABS) 100 MG tablet, Take 1 tablet (100 mg total) by mouth 2 (two) times daily., Disp: 20 tablet, Rfl: 0 .  mirtazapine (REMERON) 30 MG tablet, Take 30 mg by mouth at bedtime., Disp: , Rfl:  .  sodium bicarbonate 650 MG tablet, Take 650 mg by mouth 4 (four) times daily., Disp: , Rfl:  .  acetaminophen (TYLENOL) 325 MG tablet, Take 975 mg by mouth every 8 (eight) hours as needed., Disp: , Rfl:  .  allopurinol (ZYLOPRIM) 300 MG tablet, TAKE 1 TABLET(300 MG) BY MOUTH DAILY (Patient taking differently: Take 100 mg by mouth 3 (three) times a week. Every Monday, Wednesday and Friday.), Disp: 90 tablet, Rfl: 1 .  apixaban (ELIQUIS) 5 MG TABS tablet, Take 5 mg by mouth every 12 (twelve) hours., Disp: , Rfl:  .  aspirin EC 81 MG tablet, Take 81 mg by mouth daily with lunch., Disp: , Rfl:  .  atorvastatin (LIPITOR) 40 MG tablet, Take 20 mg by mouth See admin instructions. Take 20 mg at bedtime for 20 days, then  take 40 mg at bedtime., Disp: , Rfl:  .  bisacodyl (DULCOLAX) 10 MG suppository, Place 10 mg rectally daily as needed for moderate constipation., Disp: , Rfl:  .  capsaicin (ZOSTRIX) 0.025 % cream, Apply 1 application topically every 12 (twelve) hours as needed (pain below right scapula)., Disp: , Rfl:  .  ferrous sulfate 325 (65 FE) MG tablet, Take 325 mg by mouth at bedtime., Disp: , Rfl:  .  gabapentin (NEURONTIN) 300 MG capsule, Take 300 mg by mouth at bedtime., Disp: , Rfl:  .  hydrocortisone 2.5 % cream, Apply 1 application topically 3 (three) times daily as needed (hemorrhoidal itching)., Disp: , Rfl:  .  levothyroxine (SYNTHROID, LEVOTHROID) 75 MCG tablet, Take 1 tablet (75 mcg total) by mouth daily before breakfast. (Patient taking differently: Take 75 mcg by mouth daily.), Disp: 90 tablet, Rfl: 3 .  melatonin 5 MG TABS, Take 5 mg by mouth at bedtime as needed (sleep)., Disp: , Rfl:  .  metoprolol tartrate (LOPRESSOR) 25 MG tablet, Take 12.5 mg by mouth every 12 (twelve) hours., Disp: , Rfl:  .  white petrolatum ointment, Apply 1 application topically in the morning and at bedtime., Disp: , Rfl:   Social History   Tobacco Use  Smoking Status Former  Smoker  . Types: Cigarettes  . Quit date: 04/09/1966  . Years since quitting: 62.3  Smokeless Tobacco Former User    Allergies  Allergen Reactions  . Codeine Other (See Comments)    hyperactive   Objective:  There were no vitals filed for this visit. There is no height or weight on file to calculate BMI. Constitutional Well developed. Well nourished.  Vascular Dorsalis pedis pulses palpable bilaterally. Posterior tibial pulses palpable bilaterally. Capillary refill normal to all digits.  No cyanosis or clubbing noted. Pedal hair growth normal.  Neurologic Normal speech. Oriented to person, place, and time. Epicritic sensation to light touch grossly present bilaterally.  Dermatologic Painful ingrowing nail at lateral nail  borders of the hallux nail right. No other open wounds. No skin lesions.  Orthopedic: Normal joint ROM without pain or crepitus bilaterally. No visible deformities. No bony tenderness.   Radiographs: None Assessment:   1. Nail dystrophy   2. Paronychia of toe of right foot due to ingrown toenail    Plan:  Patient was evaluated and treated and all questions answered.  Ingrown Nail, right with underlying nail dystrophy -Patient elects to proceed with minor surgery to remove ingrown toenail removal today. Consent reviewed and signed by patient. -Ingrown nail excised. See procedure note. -Educated on post-procedure care including soaking. Written instructions provided and reviewed. -Patient to follow up in 2 weeks for nail check if needed -Doxycycline was dispensed for skin and soft tissue prophylaxis  Procedure: Excision of Ingrown Toenail Location: Right 1st toe lateral nail borders. Anesthesia: Lidocaine 1% plain; 1.5 mL and Marcaine 0.5% plain; 1.5 mL, digital block. Skin Prep: Betadine. Dressing: Silvadene; telfa; dry, sterile, compression dressing. Technique: Following skin prep, the toe was exsanguinated and a tourniquet was secured at the base of the toe. The affected nail border was freed, split with a nail splitter, and excised. Chemical matrixectomy was then performed with phenol and irrigated out with alcohol. The tourniquet was then removed and sterile dressing applied. Disposition: Patient tolerated procedure well. Patient to return in 2 weeks for follow-up.   No follow-ups on file.

## 2020-08-09 ENCOUNTER — Encounter: Payer: Self-pay | Admitting: Podiatry

## 2020-08-09 ENCOUNTER — Ambulatory Visit (INDEPENDENT_AMBULATORY_CARE_PROVIDER_SITE_OTHER): Payer: No Typology Code available for payment source | Admitting: Podiatry

## 2020-08-09 ENCOUNTER — Other Ambulatory Visit: Payer: Self-pay

## 2020-08-09 DIAGNOSIS — L600 Ingrowing nail: Secondary | ICD-10-CM

## 2020-08-09 DIAGNOSIS — L03031 Cellulitis of right toe: Secondary | ICD-10-CM | POA: Diagnosis not present

## 2020-08-09 NOTE — Progress Notes (Signed)
Subjective: Dalton Roy is a 80 y.o.  male returns to office today for follow up evaluation after having right Hallux Lateral border nail avulsion performed. Patient has been soaking using epsom salt and applying topical antibiotic covered with bandaid daily. Patient denies fevers, chills, nausea, vomiting. Denies any calf pain, chest pain, SOB.   Objective:  Vitals: Reviewed  General: Well developed, nourished, in no acute distress, alert and oriented x3   Dermatology: Skin is warm, dry and supple bilateral. Lateral hallux nail border appears to be clean, dry, with mild granular tissue and surrounding scab. There is no surrounding erythema, edema, drainage/purulence. The remaining nails appear unremarkable at this time. There are no other lesions or other signs of infection present.  Neurovascular status: Intact. No lower extremity swelling; No pain with calf compression bilateral.  Musculoskeletal: Decreased tenderness to palpation of the Lateral hallux nail fold(s). Muscular strength within normal limits bilateral.   Assesement and Plan: S/p partial nail avulsion, doing well.   -Continue soaking in epsom salts twice a day followed by antibiotic ointment and a band-aid as needed. Can leave uncovered at night. .  -If the area does not heal properply, call the office for follow-up appointment, or sooner if any problems arise.  -Monitor for any signs/symptoms of infection. Call the office immediately if any occur or go directly to the emergency room. Call with any questions/concerns.  Boneta Lucks, DPM

## 2020-08-22 ENCOUNTER — Other Ambulatory Visit: Payer: Self-pay

## 2020-08-22 ENCOUNTER — Encounter: Payer: No Typology Code available for payment source | Attending: Family Medicine | Admitting: *Deleted

## 2020-08-22 DIAGNOSIS — F064 Anxiety disorder due to known physiological condition: Secondary | ICD-10-CM | POA: Insufficient documentation

## 2020-08-22 DIAGNOSIS — I4891 Unspecified atrial fibrillation: Secondary | ICD-10-CM | POA: Insufficient documentation

## 2020-08-22 DIAGNOSIS — Z951 Presence of aortocoronary bypass graft: Secondary | ICD-10-CM | POA: Insufficient documentation

## 2020-08-22 DIAGNOSIS — G4733 Obstructive sleep apnea (adult) (pediatric): Secondary | ICD-10-CM | POA: Insufficient documentation

## 2020-08-22 DIAGNOSIS — N179 Acute kidney failure, unspecified: Secondary | ICD-10-CM | POA: Insufficient documentation

## 2020-08-22 NOTE — Progress Notes (Signed)
Initial telephone orientation completed. Diagnosis can be found in [place] paperwork ([facility] Oct '21). EP orientation scheduled for Monday 5/23 at 10:30am.

## 2020-09-01 ENCOUNTER — Encounter: Payer: Self-pay | Admitting: *Deleted

## 2020-09-12 ENCOUNTER — Encounter: Payer: No Typology Code available for payment source | Attending: Family Medicine | Admitting: *Deleted

## 2020-09-12 ENCOUNTER — Other Ambulatory Visit: Payer: Self-pay

## 2020-09-12 VITALS — Ht 71.9 in | Wt 174.4 lb

## 2020-09-12 DIAGNOSIS — Z951 Presence of aortocoronary bypass graft: Secondary | ICD-10-CM | POA: Insufficient documentation

## 2020-09-12 NOTE — Progress Notes (Signed)
Cardiac Individual Treatment Plan  Patient Details  Name: Dalton OEHLERT MRN: [id number] Date of Birth: Jun 11, 1940 Referring Provider:   Flowsheet Row Cardiac Rehab from 09/12/2020 in [facility] Cardiac and Pulmonary Rehab  Referring Provider Oletta Cohn ([place])      Initial Encounter Date:  Flowsheet Row Cardiac Rehab from 09/12/2020 in [facility] Cardiac and Pulmonary Rehab  Date 09/12/20      Visit Diagnosis: S/P CABG x 3  Patient's Home Medications on Admission:  Current Outpatient Medications:  .  acetaminophen (TYLENOL) 325 MG tablet, Take 975 mg by mouth every 8 (eight) hours as needed., Disp: , Rfl:  .  allopurinol (ZYLOPRIM) 300 MG tablet, TAKE 1 TABLET(300 MG) BY MOUTH DAILY (Patient taking differently: Take 100 mg by mouth 3 (three) times a week. Every Monday, Wednesday and Friday.), Disp: 90 tablet, Rfl: 1 .  apixaban (ELIQUIS) 5 MG TABS tablet, Take 5 mg by mouth every 12 (twelve) hours., Disp: , Rfl:  .  aspirin EC 81 MG tablet, Take 81 mg by mouth daily with lunch., Disp: , Rfl:  .  atorvastatin (LIPITOR) 40 MG tablet, Take 20 mg by mouth See admin instructions. Take 20 mg at bedtime for 20 days, then take 40 mg at bedtime., Disp: , Rfl:  .  bisacodyl (DULCOLAX) 10 MG suppository, Place 10 mg rectally daily as needed for moderate constipation., Disp: , Rfl:  .  capsaicin (ZOSTRIX) 0.025 % cream, Apply 1 application topically every 12 (twelve) hours as needed (pain below right scapula)., Disp: , Rfl:  .  doxycycline (VIBRA-TABS) 100 MG tablet, Take 1 tablet (100 mg total) by mouth 2 (two) times daily., Disp: 20 tablet, Rfl: 0 .  ferrous sulfate 325 (65 FE) MG tablet, Take 325 mg by mouth at bedtime., Disp: , Rfl:  .  gabapentin (NEURONTIN) 300 MG capsule, Take 300 mg by mouth at bedtime., Disp: , Rfl:  .  hydrocortisone 2.5 % cream, Apply 1 application topically 3 (three) times daily as needed (hemorrhoidal itching)., Disp: , Rfl:  .  levothyroxine (SYNTHROID, LEVOTHROID) 75 MCG tablet,  Take 1 tablet (75 mcg total) by mouth daily before breakfast. (Patient taking differently: Take 75 mcg by mouth daily.), Disp: 90 tablet, Rfl: 3 .  melatonin 5 MG TABS, Take 5 mg by mouth at bedtime as needed (sleep)., Disp: , Rfl:  .  metoprolol tartrate (LOPRESSOR) 25 MG tablet, Take 12.5 mg by mouth every 12 (twelve) hours., Disp: , Rfl:  .  mirtazapine (REMERON) 30 MG tablet, Take 30 mg by mouth at bedtime., Disp: , Rfl:  .  sodium bicarbonate 650 MG tablet, Take 650 mg by mouth 4 (four) times daily., Disp: , Rfl:  .  white petrolatum ointment, Apply 1 application topically in the morning and at bedtime., Disp: , Rfl:   Past Medical History: Past Medical History:  Diagnosis Date  . Bipolar 1 disorder (HCC)   . Cancer (HCC) 20 years   skin  . Gout   . Heart murmur   . Hypertension   . Hypothyroidism   . ITP (idiopathic thrombocytopenic purpura)   . Migraines   . Psoriasis     Tobacco Use: Social History   Tobacco Use  Smoking Status Former Smoker  . Types: Cigarettes  . Quit date: 04/09/1966  . 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   Exercise Prescription Goal: Initial exercise prescription builds to 30-45 minutes a day of aerobic activity, 2-3 days per week.  Home exercise guidelines will be given to patient during program as part of exercise prescription that the participant will acknowledge.   Education: Aerobic Exercise: - Group verbal and visual presentation on the components of exercise prescription.  Introduces F.I.T.T principle from ACSM for exercise prescriptions.  Reviews F.I.T.T. principles of aerobic exercise including progression. Written material given at graduation. Flowsheet Row Cardiac Rehab from 09/12/2020 in [facility] Cardiac and Pulmonary Rehab  Education need identified 09/12/20      Education: Resistance Exercise: - Group verbal and visual presentation on the components of exercise prescription. Introduces F.I.T.T principle from ACSM for exercise prescriptions  Reviews F.I.T.T. principles of resistance exercise including progression. Written material given at graduation.    Education: Exercise & Equipment Safety: - Individual verbal instruction and demonstration of equipment use and safety with use of the equipment. Flowsheet Row Cardiac Rehab from 09/12/2020 in [facility] Cardiac and Pulmonary Rehab  Date 09/12/20  Educator [facility]  Instruction Review Code 1- Verbalizes Understanding      Education: Exercise Physiology & General Exercise Guidelines: - Group verbal and written instruction with models to review the exercise physiology of the cardiovascular system and associated critical values. Provides general exercise guidelines with specific guidelines to those with heart or lung disease.    Education: Flexibility, Balance, Mind/Body Relaxation: - Group verbal and visual presentation with interactive activity on the components of exercise prescription. Introduces F.I.T.T principle from ACSM for exercise prescriptions. Reviews F.I.T.T. principles of flexibility and balance exercise training including progression. Also discusses the mind body connection.  Reviews various relaxation techniques to help reduce and manage stress (i.e. Deep breathing, progressive muscle relaxation, and visualization). Balance handout provided to take home. Written material given at graduation.   Activity Barriers & Risk Stratification:  Activity Barriers & Cardiac Risk Stratification - 08/22/20 1412       Activity Barriers & Cardiac Risk Stratification   Activity Barriers Muscular Weakness;Assistive Device;Other (comment)    Comments uses cane at times; some cognitive impairment (wife states its not noticeable to many people) post sepsis    Cardiac Risk Stratification High           6 Minute Walk:  6 Minute Walk    Row Name 09/12/20 1249         6 Minute Walk   Phase Initial     Distance 990 feet     Walk Time 6 minutes     # of Rest Breaks 0     MPH 1.88     METS 1.97     RPE 13     Perceived Dyspnea  1     VO2 Peak 6.91     Symptoms Yes (comment)     Comments SOB     Resting HR 75 bpm     Resting BP 110/62     Resting Oxygen Saturation  98 %     Exercise Oxygen Saturation  during 6 min walk 97 %     Max Ex. HR 79 bpm  Max Ex. BP 128/70     2 Minute Post BP 106/56            Oxygen Initial Assessment:   Oxygen Re-Evaluation:   Oxygen Discharge (Final Oxygen Re-Evaluation):   Initial Exercise Prescription:  Initial Exercise Prescription - 09/12/20 1200      Date of Initial Exercise RX and Referring Provider   Date 09/12/20    Referring Provider Tammy Sours ([place])      Recumbant Bike   Level 1    RPM 50    Watts 5    Minutes 15    METs 1.5      NuStep   Level 1    SPM 80    Minutes 15    METs 1.5      Track   Laps 25    Minutes 15    METs 2.4      Prescription Details   Frequency (times per week) 3    Duration Progress to 30 minutes of continuous aerobic without signs/symptoms of physical distress      Intensity   THRR 40-80% of Max Heartrate 101-128    Ratings of Perceived Exertion 11-13    Perceived Dyspnea 0-4      Progression   Progression Continue to progress workloads to maintain intensity without signs/symptoms of physical distress.      Resistance Training   Training Prescription Yes    Weight 4 lb    Reps 10-15           Perform Capillary Blood Glucose checks as needed.  Exercise Prescription Changes:   Exercise Prescription Changes    Row Name 09/12/20 1200             Response to Exercise   Blood Pressure (Admit) 110/62       Blood Pressure (Exercise) 128/70       Blood Pressure (Exit) 106/56       Heart Rate (Admit) 75 bpm       Heart Rate (Exercise) 79 bpm       Heart Rate (Exit) 83 bpm       Oxygen Saturation (Admit) 98 %       Oxygen Saturation (Exercise) 97 %       Rating of Perceived Exertion (Exercise) 13       Perceived Dyspnea (Exercise) 1       Symptoms SOB       Comments walk test results              Exercise Comments:   Exercise Goals and Review:  Exercise Goals    Row Name 09/12/20 1251             Exercise Goals   Increase Physical Activity Yes       Intervention Provide advice, education, support and counseling about physical activity/exercise needs.;Develop an individualized exercise prescription for aerobic and resistive training based on initial evaluation findings, risk stratification, comorbidities and participant's personal goals.       Expected Outcomes Short Term: Attend rehab on a regular basis to increase amount of physical activity.;Long Term: Add in home exercise to make exercise part of routine and to increase amount of physical activity.;Long Term: Exercising regularly at least 3-5 days a week.       Increase Strength and Stamina Yes       Intervention Provide advice, education, support and counseling about physical activity/exercise needs.;Develop an individualized exercise prescription for aerobic and resistive training based on initial  evaluation findings, risk stratification, comorbidities and participant's personal goals.       Expected Outcomes Short Term: Increase workloads from initial exercise prescription for resistance, speed, and METs.;Short Term: Perform resistance training exercises routinely during rehab and add in resistance training at home;Long Term: Improve cardiorespiratory fitness, muscular endurance and strength as  measured by increased METs and functional capacity (6MWT)       Able to understand and use rate of perceived exertion (RPE) scale Yes       Intervention Provide education and explanation on how to use RPE scale       Expected Outcomes Short Term: Able to use RPE daily in rehab to express subjective intensity level;Long Term:  Able to use RPE to guide intensity level when exercising independently       Able to understand and use Dyspnea scale Yes       Intervention Provide education and explanation on how to use Dyspnea scale       Expected Outcomes Short Term: Able to use Dyspnea scale daily in rehab to express subjective sense of shortness of breath during exertion;Long Term: Able to use Dyspnea scale to guide intensity level when exercising independently       Knowledge and understanding of Target Heart Rate Range (THRR) Yes       Intervention Provide education and explanation of THRR including how the numbers were predicted and where they are located for reference       Expected Outcomes Short Term: Able to state/look up THRR;Short Term: Able to use daily as guideline for intensity in rehab;Long Term: Able to use THRR to govern intensity when exercising independently       Able to check pulse independently Yes       Intervention Provide education and demonstration on how to check pulse in carotid and radial arteries.;Review the importance of being able to check your own pulse for safety during independent exercise       Expected Outcomes Short Term: Able to explain why pulse checking is important during independent exercise;Long Term: Able to check pulse independently and accurately       Understanding of Exercise Prescription Yes       Intervention Provide education, explanation, and written materials on patient's individual exercise prescription       Expected Outcomes Short Term: Able to explain program exercise prescription;Long Term: Able to explain home exercise prescription to exercise  independently              Exercise Goals Re-Evaluation :   Discharge Exercise Prescription (Final Exercise Prescription Changes):  Exercise Prescription Changes - 09/12/20 1200      Response to Exercise   Blood Pressure (Admit) 110/62    Blood Pressure (Exercise) 128/70    Blood Pressure (Exit) 106/56    Heart Rate (Admit) 75 bpm    Heart Rate (Exercise) 79 bpm    Heart Rate (Exit) 83 bpm    Oxygen Saturation (Admit) 98 %    Oxygen Saturation (Exercise) 97 %    Rating of Perceived Exertion (Exercise) 13    Perceived Dyspnea (Exercise) 1    Symptoms SOB    Comments walk test results           Nutrition:  Target Goals: Understanding of nutrition guidelines, daily intake of sodium '1500mg'$ , cholesterol '200mg'$ , calories 30% from fat and 7% or less from saturated fats, daily to have 5 or more servings of fruits and vegetables.  Education: All About Nutrition: -Group instruction  provided by verbal, written material, interactive activities, discussions, models, and posters to present general guidelines for heart healthy nutrition including fat, fiber, MyPlate, the role of sodium in heart healthy nutrition, utilization of the nutrition label, and utilization of this knowledge for meal planning. Follow up email sent as well. Written material given at graduation.   Biometrics:  Pre Biometrics - 09/12/20 1252      Pre Biometrics   Height 5' 11.9" (1.826 m)    Weight 174 lb 6.4 oz (79.1 kg)    BMI (Calculated) 23.73    Single Leg Stand 0.5 seconds            Nutrition Therapy Plan and Nutrition Goals:  Nutrition Therapy & Goals - 09/12/20 1253      Intervention Plan   Intervention Prescribe, educate and counsel regarding individualized specific dietary modifications aiming towards targeted core components such as weight, hypertension, lipid management, diabetes, heart failure and other comorbidities.    Expected Outcomes Short Term Goal: Understand basic principles of  dietary content, such as calories, fat, sodium, cholesterol and nutrients.;Short Term Goal: A plan has been developed with personal nutrition goals set during dietitian appointment.;Long Term Goal: Adherence to prescribed nutrition plan.           Nutrition Assessments:  MEDIFICTS Score Key:  ?70 Need to make dietary changes   40-70 Heart Healthy Diet  ? 40 Therapeutic Level Cholesterol Diet  Flowsheet Row Cardiac Rehab from 09/12/2020 in [facility] Cardiac and Pulmonary Rehab  Picture Your Plate Total Score on Admission 83     Picture Your Plate Scores:  <09 Unhealthy dietary pattern with much room for improvement.  41-50 Dietary pattern unlikely to meet recommendations for good health and room for improvement.  51-60 More healthful dietary pattern, with some room for improvement.   >60 Healthy dietary pattern, although there may be some specific behaviors that could be improved.    Nutrition Goals Re-Evaluation:   Nutrition Goals Discharge (Final Nutrition Goals Re-Evaluation):   Psychosocial: Target Goals: Acknowledge presence or absence of significant depression and/or stress, maximize coping skills, provide positive support system. Participant is able to verbalize types and ability to use techniques and skills needed for reducing stress and depression.   Education: Stress, Anxiety, and Depression - Group verbal and visual presentation to define topics covered.  Reviews how body is impacted by stress, anxiety, and depression.  Also discusses healthy ways to reduce stress and to treat/manage anxiety and depression.  Written material given at graduation.   Education: Sleep Hygiene -Provides group verbal and written instruction about how sleep can affect your health.  Define sleep hygiene, discuss sleep cycles and impact of sleep habits. Review good sleep hygiene tips.    Initial Review & Psychosocial Screening:  Initial Psych Review & Screening - 08/22/20 1420       Initial Review   Current issues with Current Stress Concerns;History of Depression;Current Depression;Current Anxiety/Panic    Source of Stress Concerns Chronic Illness;Unable to perform yard/household activities;Unable to participate in former interests or hobbies      [place]? Yes      Barriers   Psychosocial barriers to participate in program There are no identifiable barriers or psychosocial needs.;The patient should benefit from training in stress management and relaxation.      Screening Interventions   Interventions Provide feedback about the scores to participant;Encouraged to exercise;To provide support and resources with identified psychosocial needs    Expected Outcomes Short  Term goal: Utilizing psychosocial counselor, staff and physician to assist with identification of specific Stressors or current issues interfering with healing process. Setting desired goal for each stressor or current issue identified.;Long Term Goal: Stressors or current issues are controlled or eliminated.;Short Term goal: Identification and review with participant of any Quality of Life or Depression concerns found by scoring the questionnaire.;Long Term goal: The participant improves quality of Life and PHQ9 Scores as seen by post scores and/or verbalization of changes           Quality of Life Scores:   Quality of Life - 09/01/20 1536      Quality of Life   Select Quality of Life      Quality of Life Scores   Health/Function Pre 12.5 %    Socioeconomic Pre 23.63 %    Psych/Spiritual Pre 21.43 %    Family Pre 28.8 %    GLOBAL Pre 19.16 %          Scores of 19 and below usually indicate a poorer quality of life in these areas.  A difference of  2-3 points is a clinically meaningful difference.  A difference of 2-3 points in the total score of the Quality of Life Index has been associated with significant improvement in overall quality of life, self-image, physical  symptoms, and general health in studies assessing change in quality of life.  PHQ-9: Recent Review Flowsheet Data    Depression screen [facility] 2/9 09/12/2020 08/24/2015   Decreased Interest 1 2   Down, Depressed, Hopeless 1 3   PHQ - 2 Score 2 5   Altered sleeping 3 1   Tired, decreased energy 3 2   Change in appetite 2 1   Feeling bad or failure about yourself  0 0   Trouble concentrating 3 1   Moving slowly or fidgety/restless 2 1   Suicidal thoughts 0 0   PHQ-9 Score 15 11   Difficult doing work/chores Somewhat difficult -     Interpretation of Total Score  Total Score Depression Severity:  1-4 = Minimal depression, 5-9 = Mild depression, 10-14 = Moderate depression, 15-19 = Moderately severe depression, 20-27 = Severe depression   Psychosocial Evaluation and Intervention:  Psychosocial Evaluation - 08/22/20 1444      Psychosocial Evaluation & Interventions   Interventions Encouraged to exercise with the program and follow exercise prescription;Stress management education;Relaxation education    Comments Marden Noble had a CABG in October of 2021 after being hospitalized for Sepsis related to a tooth infection. His wife states his cognitive abilities have become impaired since the sepsis so she is his main caregiver. She planned her cardiac rehab around him, so they will be coming together. 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 During PT he was told his strength is there, he needs to work on his stamina so he is excited to come to cardiac rehab to work on this. He has had a rough year and is ready to start feeling more like himself and start doing the things/hobbies he misses.    Expected Outcomes Short: attend cardiac rehab for education and exercise. Long: develop positive self care habits.    Continue Psychosocial Services  Follow up required by staff           Psychosocial Re-Evaluation:   Psychosocial  Discharge (Final Psychosocial Re-Evaluation):   Vocational Rehabilitation: Provide vocational rehab assistance to qualifying candidates.  Vocational Rehab Evaluation & Intervention:  Vocational Rehab - 08/22/20 1420      Initial Vocational Rehab Evaluation & Intervention   Assessment shows need for Vocational Rehabilitation No           Education: Education Goals: Education classes will be provided on a variety of topics geared toward better understanding of heart health and risk factor modification. Participant will state understanding/return demonstration of topics presented as noted by education test scores.  Learning Barriers/Preferences:  Learning Barriers/Preferences - 08/22/20 1416      Learning Barriers/Preferences   Learning Barriers None   some cognitive issues   Learning Preferences Individual Instruction           General Cardiac Education Topics:  AED/CPR: - Group verbal and written instruction with the use of models to demonstrate the basic use of the AED with the basic ABC's of resuscitation.   Anatomy and Cardiac Procedures: - Group verbal and visual presentation and models provide information about basic cardiac anatomy and function. Reviews the testing methods done to diagnose heart disease and the outcomes of the test results. Describes the treatment choices: Medical Management, Angioplasty, or Coronary Bypass Surgery for treating various heart conditions including Myocardial Infarction, Angina, Valve Disease, and Cardiac Arrhythmias.  Written material given at graduation.   Medication Safety: - Group verbal and visual instruction to review commonly prescribed medications for heart and lung disease. Reviews the medication, class of the drug, and side effects. Includes the steps to properly store meds and maintain the prescription regimen.  Written material given at graduation.   Intimacy: - Group verbal instruction through game format to discuss how  heart and lung disease can affect sexual intimacy. Written material given at graduation..   Know Your Numbers and Heart Failure: - Group verbal and visual instruction to discuss disease risk factors for cardiac and pulmonary disease and treatment options.  Reviews associated critical values for Overweight/Obesity, Hypertension, Cholesterol, and Diabetes.  Discusses basics of heart failure: signs/symptoms and treatments.  Introduces Heart Failure Zone chart for action plan for heart failure.  Written material given at graduation.   Infection Prevention: - Provides verbal and written material to individual with discussion of infection control including proper hand washing and proper equipment cleaning during exercise session. Flowsheet Row Cardiac Rehab from 09/12/2020 in [facility] Cardiac and Pulmonary Rehab  Date 09/12/20  Educator [facility]  Instruction Review Code 1- Verbalizes Understanding      Falls Prevention: - Provides verbal and written material to individual with discussion of falls prevention and safety. Flowsheet Row Cardiac Rehab from 09/12/2020 in [facility] Cardiac and Pulmonary Rehab  Date 09/12/20  Educator [facility]  Instruction Review Code 1- Verbalizes Understanding      Other: -Provides group and verbal instruction on various topics (see comments)   Knowledge Questionnaire Score:  Knowledge Questionnaire Score - 09/01/20 1536      Knowledge Questionnaire Score   Pre Score 25/26           Core Components/Risk Factors/Patient Goals at Admission:  Personal Goals and Risk Factors at Admission - 09/12/20 1254      Core Components/Risk Factors/Patient Goals on Admission    Weight Management Yes;Weight Maintenance    Intervention Weight Management: Develop a combined nutrition and exercise program designed to reach desired caloric intake, while maintaining appropriate intake of nutrient and fiber, sodium and fats, and appropriate energy expenditure required for the weight goal.;Weight  Management: Provide education and appropriate resources to help participant work on and attain  dietary goals.    Admit Weight 174 lb 6.4 oz (79.1 kg)    Goal Weight: Short Term 174 lb (78.9 kg)    Goal Weight: Long Term 174 lb (78.9 kg)    Expected Outcomes Short Term: Continue to assess and modify interventions until short term weight is achieved;Weight Maintenance: Understanding of the daily nutrition guidelines, which includes 25-35% calories from fat, 7% or less cal from saturated fats, less than $RemoveB'200mg'[id number]$  cholesterol, less than 1.5gm of sodium, & 5 or more servings of fruits and vegetables daily;Long Term: Adherence to nutrition and physical activity/exercise program aimed toward attainment of established weight goal    Hypertension Yes    Intervention Monitor prescription use compliance.;Provide education on lifestyle modifcations including regular physical activity/exercise, weight management, moderate sodium restriction and increased consumption of fresh fruit, vegetables, and low fat dairy, alcohol moderation, and smoking cessation.    Expected Outcomes Short Term: Continued assessment and intervention until BP is < 140/26mm HG in hypertensive participants. < 130/38mm HG in hypertensive participants with diabetes, heart failure or chronic kidney disease.;Long Term: Maintenance of blood pressure at goal levels.           Education:Diabetes - Individual verbal and written instruction to review signs/symptoms of diabetes, desired ranges of glucose level fasting, after meals and with exercise. Acknowledge that pre and post exercise glucose checks will be done for 3 sessions at entry of program.   Core Components/Risk Factors/Patient Goals Review:    Core Components/Risk Factors/Patient Goals at Discharge (Final Review):    ITP Comments:  ITP Comments    Row Name 08/22/20 1443 09/12/20 1249         ITP Comments Initial telephone orientation completed. Diagnosis can be found in [place]  paperwork ([facility] Oct '21). EP orientation scheduled for Monday 5/23 at 10:30am. Completed 6MWT and gym orientation. Initial ITP created and sent for review to Dr. Emily Filbert, Medical Director.             Comments: Initial ITP

## 2020-09-12 NOTE — Patient Instructions (Signed)
Patient Instructions  Patient Details  Name: Dalton Roy MRN: [id number] Date of Birth: [birth year] Referring Provider:  Bennie Pierini, MD  Below are your personal goals for exercise, nutrition, and risk factors. Our goal is to help you stay on track towards obtaining and maintaining these goals. We will be discussing your progress on these goals with you throughout the program.  Initial Exercise Prescription:  Initial Exercise Prescription - 09/12/20 1200      Date of Initial Exercise RX and Referring Provider   Date 09/12/20    Referring Provider Tammy Sours ([place])      Recumbant Bike   Level 1    RPM 50    Watts 5    Minutes 15    METs 1.5      NuStep   Level 1    SPM 80    Minutes 15    METs 1.5      Track   Laps 25    Minutes 15    METs 2.4      Prescription Details   Frequency (times per week) 3    Duration Progress to 30 minutes of continuous aerobic without signs/symptoms of physical distress      Intensity   THRR 40-80% of Max Heartrate 101-128    Ratings of Perceived Exertion 11-13    Perceived Dyspnea 0-4      Progression   Progression Continue to progress workloads to maintain intensity without signs/symptoms of physical distress.      Resistance Training   Training Prescription Yes    Weight 4 lb    Reps 10-15           Exercise Goals: Frequency: Be able to perform aerobic exercise two to three times per week in program working toward 2-5 days per week of home exercise.  Intensity: Work with a perceived exertion of 11 (fairly light) - 15 (hard) while following your exercise prescription.  We will make changes to your prescription with you as you progress through the program.   Duration: Be able to do 30 to 45 minutes of continuous aerobic exercise in addition to a 5 minute warm-up and a 5 minute cool-down routine.   Nutrition Goals: Your personal nutrition goals will be established when you do your nutrition analysis with the  dietician.  The following are general nutrition guidelines to follow: Cholesterol < $RemoveBefor'200mg'AlTwTWcxJXLG$ /day Sodium < $Remove'1500mg'fBvPbvt$ /day Fiber: Men over 50 yrs - 30 grams per day  Personal Goals:  Personal Goals and Risk Factors at Admission - 09/12/20 1254      Core Components/Risk Factors/Patient Goals on Admission    Weight Management Yes;Weight Maintenance    Intervention Weight Management: Develop a combined nutrition and exercise program designed to reach desired caloric intake, while maintaining appropriate intake of nutrient and fiber, sodium and fats, and appropriate energy expenditure required for the weight goal.;Weight Management: Provide education and appropriate resources to help participant work on and attain dietary goals.    Admit Weight 174 lb 6.4 oz (79.1 kg)    Goal Weight: Short Term 174 lb (78.9 kg)    Goal Weight: Long Term 174 lb (78.9 kg)    Expected Outcomes Short Term: Continue to assess and modify interventions until short term weight is achieved;Weight Maintenance: Understanding of the daily nutrition guidelines, which includes 25-35% calories from fat, 7% or less cal from saturated fats, less than $RemoveB'200mg'puypYcDw$  cholesterol, less than 1.5gm of sodium, & 5 or more servings of fruits and vegetables daily;Long Term:  Adherence to nutrition and physical activity/exercise program aimed toward attainment of established weight goal    Hypertension Yes    Intervention Monitor prescription use compliance.;Provide education on lifestyle modifcations including regular physical activity/exercise, weight management, moderate sodium restriction and increased consumption of fresh fruit, vegetables, and low fat dairy, alcohol moderation, and smoking cessation.    Expected Outcomes Short Term: Continued assessment and intervention until BP is < 140/69mm HG in hypertensive participants. < 130/42mm HG in hypertensive participants with diabetes, heart failure or chronic kidney disease.;Long Term: Maintenance of blood  pressure at goal levels.           Tobacco Use Initial Evaluation: Social History   Tobacco Use  Smoking Status Former Smoker  . Types: Cigarettes  . Quit date: 04/09/1966  . Years since quitting: 54.4  Smokeless Tobacco Former Systems developer    Exercise Goals and Review:  Exercise Goals    [place] Name 09/12/20 1251             Exercise Goals   Increase Physical Activity Yes       Intervention Provide advice, education, support and counseling about physical activity/exercise needs.;Develop an individualized exercise prescription for aerobic and resistive training based on initial evaluation findings, risk stratification, comorbidities and participant's personal goals.       Expected Outcomes Short Term: Attend rehab on a regular basis to increase amount of physical activity.;Long Term: Add in home exercise to make exercise part of routine and to increase amount of physical activity.;Long Term: Exercising regularly at least 3-5 days a week.       Increase Strength and Stamina Yes       Intervention Provide advice, education, support and counseling about physical activity/exercise needs.;Develop an individualized exercise prescription for aerobic and resistive training based on initial evaluation findings, risk stratification, comorbidities and participant's personal goals.       Expected Outcomes Short Term: Increase workloads from initial exercise prescription for resistance, speed, and METs.;Short Term: Perform resistance training exercises routinely during rehab and add in resistance training at home;Long Term: Improve cardiorespiratory fitness, muscular endurance and strength as measured by increased METs and functional capacity (6MWT)       Able to understand and use rate of perceived exertion (RPE) scale Yes       Intervention Provide education and explanation on how to use RPE scale       Expected Outcomes Short Term: Able to use RPE daily in rehab to express subjective intensity level;Long  Term:  Able to use RPE to guide intensity level when exercising independently       Able to understand and use Dyspnea scale Yes       Intervention Provide education and explanation on how to use Dyspnea scale       Expected Outcomes Short Term: Able to use Dyspnea scale daily in rehab to express subjective sense of shortness of breath during exertion;Long Term: Able to use Dyspnea scale to guide intensity level when exercising independently       Knowledge and understanding of Target Heart Rate Range (THRR) Yes       Intervention Provide education and explanation of THRR including how the numbers were predicted and where they are located for reference       Expected Outcomes Short Term: Able to state/look up THRR;Short Term: Able to use daily as guideline for intensity in rehab;Long Term: Able to use THRR to govern intensity when exercising independently       Able to  check pulse independently Yes       Intervention Provide education and demonstration on how to check pulse in carotid and radial arteries.;Review the importance of being able to check your own pulse for safety during independent exercise       Expected Outcomes Short Term: Able to explain why pulse checking is important during independent exercise;Long Term: Able to check pulse independently and accurately       Understanding of Exercise Prescription Yes       Intervention Provide education, explanation, and written materials on patient's individual exercise prescription       Expected Outcomes Short Term: Able to explain program exercise prescription;Long Term: Able to explain home exercise prescription to exercise independently              Copy of goals given to participant.

## 2020-09-14 ENCOUNTER — Other Ambulatory Visit: Payer: Self-pay

## 2020-09-14 DIAGNOSIS — Z951 Presence of aortocoronary bypass graft: Secondary | ICD-10-CM

## 2020-09-14 NOTE — Progress Notes (Signed)
Daily Session Note  Patient Details  Name: Dalton Roy MRN: [id number] Date of Birth: 28-Sep-1940 Referring Provider:   Flowsheet Row Cardiac Rehab from 09/12/2020 in [facility] Cardiac and Pulmonary Rehab  Referring Provider Tammy Sours ([place])      Encounter Date: 09/14/2020  Check In:  Session Check In - 09/14/20 0959      Check-In   Supervising physician immediately available to respond to emergencies See telemetry face sheet for immediately available ER MD    Location [facility]-Cardiac & Pulmonary Rehab    Staff Present Birdie Sons, MPA, Elveria Rising, BA, ACSM CEP, Exercise Physiologist;Joseph Tessie Fass RCP,RRT,BSRT    Virtual Visit No    Medication changes reported     No    Fall or balance concerns reported    No    Warm-up and Cool-down Performed on first and last piece of equipment    Resistance Training Performed Yes    VAD Patient? No    PAD/SET Patient? No      Pain Assessment   Currently in Pain? No/denies              Social History   Tobacco Use  Smoking Status Former Smoker  . Types: Cigarettes  . Quit date: 04/09/1966  . Years since quitting: 54.4  Smokeless Tobacco Former Systems developer    Goals Met:  Independence with exercise equipment Exercise tolerated well No report of cardiac concerns or symptoms Strength training completed today  Goals Unmet:  Not Applicable  Comments: First full day of exercise!  Patient was oriented to gym and equipment including functions, settings, policies, and procedures.  Patient's individual exercise prescription and treatment plan were reviewed.  All starting workloads were established based on the results of the 6 minute walk test done at initial orientation visit.  The plan for exercise progression was also introduced and progression will be customized based on patient's performance and goals.    Dr. Emily Filbert is Medical Director for [place].  Dr. Ottie Glazier is Medical Director for [facility]  Pulmonary Rehabilitation.

## 2020-09-19 ENCOUNTER — Other Ambulatory Visit: Payer: Self-pay

## 2020-09-19 ENCOUNTER — Encounter: Payer: No Typology Code available for payment source | Admitting: *Deleted

## 2020-09-19 DIAGNOSIS — Z951 Presence of aortocoronary bypass graft: Secondary | ICD-10-CM | POA: Diagnosis not present

## 2020-09-19 NOTE — Progress Notes (Signed)
Daily Session Note  Patient Details  Name: Dalton Roy MRN: [id number] Date of Birth: 05-Jul-1940 Referring Provider:   Flowsheet Row Cardiac Rehab from 09/12/2020 in [facility] Cardiac and Pulmonary Rehab  Referring Provider Tammy Sours ([place])       Encounter Date: 09/19/2020  Check In:  Session Check In - 09/19/20 1123       Check-In   Supervising physician immediately available to respond to emergencies See telemetry face sheet for immediately available ER MD    Location [facility]-Cardiac & Pulmonary Rehab    Staff Present Renita Papa, RN BSN;Joseph [address] [place], MPA, [place], BS, ACSM CEP, Exercise Physiologist    Virtual Visit No    Medication changes reported     No    Fall or balance concerns reported    No    Warm-up and Cool-down Performed on first and last piece of equipment    Resistance Training Performed Yes    VAD Patient? No    PAD/SET Patient? No      Pain Assessment   Currently in Pain? No/denies                Social History   Tobacco Use  Smoking Status Former   Pack years: 0.00   Types: Cigarettes   Quit date: 04/09/1966   Years since quitting: 54.4  Smokeless Tobacco Former    Goals Met:  Independence with exercise equipment Exercise tolerated well No report of cardiac concerns or symptoms Strength training completed today  Goals Unmet:  Not Applicable  Comments: Pt able to follow exercise prescription today without complaint.  Will continue to monitor for progression.    Dr. Emily Filbert is Medical Director for [place].  Dr. Ottie Glazier is Medical Director for [facility] Pulmonary Rehabilitation.

## 2020-09-21 ENCOUNTER — Other Ambulatory Visit: Payer: Self-pay

## 2020-09-21 ENCOUNTER — Encounter: Payer: No Typology Code available for payment source | Admitting: *Deleted

## 2020-09-21 ENCOUNTER — Encounter: Payer: Self-pay | Admitting: *Deleted

## 2020-09-21 DIAGNOSIS — Z951 Presence of aortocoronary bypass graft: Secondary | ICD-10-CM

## 2020-09-21 NOTE — Progress Notes (Signed)
Daily Session Note  Patient Details  Name: Dalton Roy MRN: [id number] Date of Birth: 04/09/1941 Referring Provider:   Flowsheet Row Cardiac Rehab from 09/12/2020 in [facility] Cardiac and Pulmonary Rehab  Referring Provider Tammy Sours ([place])       Encounter Date: 09/21/2020  Check In:  Session Check In - 09/21/20 1130       Check-In   Supervising physician immediately available to respond to emergencies See telemetry face sheet for immediately available ER MD    Location [facility]-Cardiac & Pulmonary Rehab    Staff Present Birdie Sons, MPA, RN;Melissa Caiola RDN, LDN;Joseph [facility];Heath Lark, RN, BSN, CCRP    Virtual Visit No    Medication changes reported     No    Fall or balance concerns reported    No    Warm-up and Cool-down Performed on first and last piece of equipment    Resistance Training Performed Yes    VAD Patient? No    PAD/SET Patient? No      Pain Assessment   Currently in Pain? No/denies                Social History   Tobacco Use  Smoking Status Former   Pack years: 0.00   Types: Cigarettes   Quit date: 04/09/1966   Years since quitting: 54.4  Smokeless Tobacco Former    Goals Met:  Independence with exercise equipment Exercise tolerated well No report of cardiac concerns or symptoms  Goals Unmet:  Not Applicable  Comments: Pt able to follow exercise prescription today without complaint.  Will continue to monitor for progression.    Dr. Emily Filbert is Medical Director for [place].  Dr. Ottie Glazier is Medical Director for [facility] Pulmonary Rehabilitation.

## 2020-09-21 NOTE — Progress Notes (Signed)
Cardiac Individual Treatment Plan  Patient Details  Name: Dalton Roy MRN: [id number] Date of Birth: 1940/09/29 Referring Provider:   Flowsheet Row Cardiac Rehab from 09/12/2020 in [facility] Cardiac and Pulmonary Rehab  Referring Provider Tammy Sours ([place])       Initial Encounter Date:  Flowsheet Row Cardiac Rehab from 09/12/2020 in [facility] Cardiac and Pulmonary Rehab  Date 09/12/20       Visit Diagnosis: S/P CABG x 3  Patient's Home Medications on Admission:  Current Outpatient Medications:    acetaminophen (TYLENOL) 325 MG tablet, Take 975 mg by mouth every 8 (eight) hours as needed., Disp: , Rfl:    allopurinol (ZYLOPRIM) 300 MG tablet, TAKE 1 TABLET(300 MG) BY MOUTH DAILY (Patient taking differently: Take 100 mg by mouth 3 (three) times a week. Every Monday, Wednesday and Friday.), Disp: 90 tablet, Rfl: 1   apixaban (ELIQUIS) 5 MG TABS tablet, Take 5 mg by mouth every 12 (twelve) hours., Disp: , Rfl:    aspirin EC 81 MG tablet, Take 81 mg by mouth daily with lunch., Disp: , Rfl:    atorvastatin (LIPITOR) 40 MG tablet, Take 20 mg by mouth See admin instructions. Take 20 mg at bedtime for 20 days, then take 40 mg at bedtime., Disp: , Rfl:    bisacodyl (DULCOLAX) 10 MG suppository, Place 10 mg rectally daily as needed for moderate constipation., Disp: , Rfl:    capsaicin (ZOSTRIX) 0.025 % cream, Apply 1 application topically every 12 (twelve) hours as needed (pain below right scapula)., Disp: , Rfl:    doxycycline (VIBRA-TABS) 100 MG tablet, Take 1 tablet (100 mg total) by mouth 2 (two) times daily., Disp: 20 tablet, Rfl: 0   ferrous sulfate 325 (65 FE) MG tablet, Take 325 mg by mouth at bedtime., Disp: , Rfl:    gabapentin (NEURONTIN) 300 MG capsule, Take 300 mg by mouth at bedtime., Disp: , Rfl:    hydrocortisone 2.5 % cream, Apply 1 application topically 3 (three) times daily as needed (hemorrhoidal itching)., Disp: , Rfl:    levothyroxine (SYNTHROID, LEVOTHROID) 75 MCG tablet, Take 1  tablet (75 mcg total) by mouth daily before breakfast. (Patient taking differently: Take 75 mcg by mouth daily.), Disp: 90 tablet, Rfl: 3   melatonin 5 MG TABS, Take 5 mg by mouth at bedtime as needed (sleep)., Disp: , Rfl:    metoprolol tartrate (LOPRESSOR) 25 MG tablet, Take 12.5 mg by mouth every 12 (twelve) hours., Disp: , Rfl:    mirtazapine (REMERON) 30 MG tablet, Take 30 mg by mouth at bedtime., Disp: , Rfl:    sodium bicarbonate 650 MG tablet, Take 650 mg by mouth 4 (four) times daily., Disp: , Rfl:    white petrolatum ointment, Apply 1 application topically in the morning and at bedtime., Disp: , Rfl:   Past Medical History: Past Medical History:  Diagnosis Date   Bipolar 1 disorder ([place])    Cancer ([place]) 20 years   skin   Gout    Heart murmur    Hypertension    Hypothyroidism    ITP (idiopathic thrombocytopenic purpura)    Migraines    Psoriasis     Tobacco Use: Social History   Tobacco Use  Smoking Status Former   Pack years: 0.00   Types: Cigarettes   Quit date: 04/09/1966   Years since quitting: 54.4  Smokeless Tobacco Former    Labs: Recent Government social research officer for [employer] Cardiac and Pulmonary Rehab Latest Ref  Rng & Units 08/11/2015   Cholestrol 0 - 200 mg/dL 179   LDLCALC 0 - 99 mg/dL 107(H)   HDL >39.00 mg/dL 34.40(L)   Trlycerides 0.0 - 149.0 mg/dL 189.0(H)        Exercise Target Goals: Exercise Program Goal: Individual exercise prescription set using results from initial 6 min walk test and THRR while considering  patient's activity barriers and safety.   Exercise Prescription Goal: Initial exercise prescription builds to 30-45 minutes a day of aerobic activity, 2-3 days per week.  Home exercise guidelines will be given to patient during program as part of exercise prescription that the participant will acknowledge.   Education: Aerobic Exercise: - Group verbal and visual presentation on the components of exercise prescription. Introduces  F.I.T.T principle from ACSM for exercise prescriptions.  Reviews F.I.T.T. principles of aerobic exercise including progression. Written material given at graduation. Flowsheet Row Cardiac Rehab from 09/14/2020 in [facility] Cardiac and Pulmonary Rehab  Education need identified 09/12/20       Education: Resistance Exercise: - Group verbal and visual presentation on the components of exercise prescription. Introduces F.I.T.T principle from ACSM for exercise prescriptions  Reviews F.I.T.T. principles of resistance exercise including progression. Written material given at graduation.    Education: Exercise & Equipment Safety: - Individual verbal instruction and demonstration of equipment use and safety with use of the equipment. Flowsheet Row Cardiac Rehab from 09/14/2020 in [facility] Cardiac and Pulmonary Rehab  Date 09/12/20  Educator [facility]  Instruction Review Code 1- Verbalizes Understanding       Education: Exercise Physiology & General Exercise Guidelines: - Group verbal and written instruction with models to review the exercise physiology of the cardiovascular system and associated critical values. Provides general exercise guidelines with specific guidelines to those with heart or lung disease.    Education: Flexibility, Balance, Mind/Body Relaxation: - Group verbal and visual presentation with interactive activity on the components of exercise prescription. Introduces F.I.T.T principle from ACSM for exercise prescriptions. Reviews F.I.T.T. principles of flexibility and balance exercise training including progression. Also discusses the mind body connection.  Reviews various relaxation techniques to help reduce and manage stress (i.e. Deep breathing, progressive muscle relaxation, and visualization). Balance handout provided to take home. Written material given at graduation.   Activity Barriers & Risk Stratification:  Activity Barriers & Cardiac Risk Stratification - 08/22/20 1412       Activity  Barriers & Cardiac Risk Stratification   Activity Barriers Muscular Weakness;Assistive Device;Other (comment)    Comments uses cane at times; some cognitive impairment (wife states its not noticeable to many people) post sepsis    Cardiac Risk Stratification High             6 Minute Walk:  6 Minute Walk     Row Name 09/12/20 1249         6 Minute Walk   Phase Initial     Distance 990 feet     Walk Time 6 minutes     # of Rest Breaks 0     MPH 1.88     METS 1.97     RPE 13     Perceived Dyspnea  1     VO2 Peak 6.91     Symptoms Yes (comment)     Comments SOB     Resting HR 75 bpm     Resting BP 110/62     Resting Oxygen Saturation  98 %     Exercise Oxygen Saturation  during 6 min  walk 97 %     Max Ex. HR 79 bpm     Max Ex. BP 128/70     2 Minute Post BP 106/56              Oxygen Initial Assessment:   Oxygen Re-Evaluation:   Oxygen Discharge (Final Oxygen Re-Evaluation):   Initial Exercise Prescription:  Initial Exercise Prescription - 09/12/20 1200       Date of Initial Exercise RX and Referring Provider   Date 09/12/20    Referring Provider [place] ([place])      Recumbant Bike   Level 1    RPM 50    Watts 5    Minutes 15    METs 1.5      NuStep   Level 1    SPM 80    Minutes 15    METs 1.5      Track   Laps 25    Minutes 15    METs 2.4      Prescription Details   Frequency (times per week) 3    Duration Progress to 30 minutes of continuous aerobic without signs/symptoms of physical distress      Intensity   THRR 40-80% of Max Heartrate 101-128    Ratings of Perceived Exertion 11-13    Perceived Dyspnea 0-4      Progression   Progression Continue to progress workloads to maintain intensity without signs/symptoms of physical distress.      Resistance Training   Training Prescription Yes    Weight 4 lb    Reps 10-15             Perform Capillary Blood Glucose checks as needed.  Exercise Prescription  Changes:   Exercise Prescription Changes     Row Name 09/12/20 1200             Response to Exercise   Blood Pressure (Admit) 110/62       Blood Pressure (Exercise) 128/70       Blood Pressure (Exit) 106/56       Heart Rate (Admit) 75 bpm       Heart Rate (Exercise) 79 bpm       Heart Rate (Exit) 83 bpm       Oxygen Saturation (Admit) 98 %       Oxygen Saturation (Exercise) 97 %       Rating of Perceived Exertion (Exercise) 13       Perceived Dyspnea (Exercise) 1       Symptoms SOB       Comments walk test results                Exercise Comments:   Exercise Comments     Row Name 09/14/20 1000           Exercise Comments First full day of exercise!  Patient was oriented to gym and equipment including functions, settings, policies, and procedures.  Patient's individual exercise prescription and treatment plan were reviewed.  All starting workloads were established based on the results of the 6 minute walk test done at initial orientation visit.  The plan for exercise progression was also introduced and progression will be customized based on patient's performance and goals.                Exercise Goals and Review:   Exercise Goals     Row Name 09/12/20 1251             Exercise  Goals   Increase Physical Activity Yes       Intervention Provide advice, education, support and counseling about physical activity/exercise needs.;Develop an individualized exercise prescription for aerobic and resistive training based on initial evaluation findings, risk stratification, comorbidities and participant's personal goals.       Expected Outcomes Short Term: Attend rehab on a regular basis to increase amount of physical activity.;Long Term: Add in home exercise to make exercise part of routine and to increase amount of physical activity.;Long Term: Exercising regularly at least 3-5 days a week.       Increase Strength and Stamina Yes       Intervention Provide advice,  education, support and counseling about physical activity/exercise needs.;Develop an individualized exercise prescription for aerobic and resistive training based on initial evaluation findings, risk stratification, comorbidities and participant's personal goals.       Expected Outcomes Short Term: Increase workloads from initial exercise prescription for resistance, speed, and METs.;Short Term: Perform resistance training exercises routinely during rehab and add in resistance training at home;Long Term: Improve cardiorespiratory fitness, muscular endurance and strength as measured by increased METs and functional capacity (6MWT)       Able to understand and use rate of perceived exertion (RPE) scale Yes       Intervention Provide education and explanation on how to use RPE scale       Expected Outcomes Short Term: Able to use RPE daily in rehab to express subjective intensity level;Long Term:  Able to use RPE to guide intensity level when exercising independently       Able to understand and use Dyspnea scale Yes       Intervention Provide education and explanation on how to use Dyspnea scale       Expected Outcomes Short Term: Able to use Dyspnea scale daily in rehab to express subjective sense of shortness of breath during exertion;Long Term: Able to use Dyspnea scale to guide intensity level when exercising independently       Knowledge and understanding of Target Heart Rate Range (THRR) Yes       Intervention Provide education and explanation of THRR including how the numbers were predicted and where they are located for reference       Expected Outcomes Short Term: Able to state/look up THRR;Short Term: Able to use daily as guideline for intensity in rehab;Long Term: Able to use THRR to govern intensity when exercising independently       Able to check pulse independently Yes       Intervention Provide education and demonstration on how to check pulse in carotid and radial arteries.;Review the  importance of being able to check your own pulse for safety during independent exercise       Expected Outcomes Short Term: Able to explain why pulse checking is important during independent exercise;Long Term: Able to check pulse independently and accurately       Understanding of Exercise Prescription Yes       Intervention Provide education, explanation, and written materials on patient's individual exercise prescription       Expected Outcomes Short Term: Able to explain program exercise prescription;Long Term: Able to explain home exercise prescription to exercise independently                Exercise Goals Re-Evaluation :  Exercise Goals Re-Evaluation     Row Name 09/14/20 1000             Exercise Goal Re-Evaluation   Exercise  Goals Review Increase Physical Activity;Able to understand and use rate of perceived exertion (RPE) scale;Knowledge and understanding of Target Heart Rate Range (THRR);Understanding of Exercise Prescription;Increase Strength and Stamina;Able to understand and use Dyspnea scale;Able to check pulse independently       Comments Reviewed RPE and dyspnea scales, THR and program prescription with pt today.  Pt voiced understanding and was given a copy of goals to take home.       Expected Outcomes Short: Use RPE daily to regulate intensity. Long: Follow program prescription in THR.                Discharge Exercise Prescription (Final Exercise Prescription Changes):  Exercise Prescription Changes - 09/12/20 1200       Response to Exercise   Blood Pressure (Admit) 110/62    Blood Pressure (Exercise) 128/70    Blood Pressure (Exit) 106/56    Heart Rate (Admit) 75 bpm    Heart Rate (Exercise) 79 bpm    Heart Rate (Exit) 83 bpm    Oxygen Saturation (Admit) 98 %    Oxygen Saturation (Exercise) 97 %    Rating of Perceived Exertion (Exercise) 13    Perceived Dyspnea (Exercise) 1    Symptoms SOB    Comments walk test results              Nutrition:  Target Goals: Understanding of nutrition guidelines, daily intake of sodium <1539m, cholesterol <2040m calories 30% from fat and 7% or less from saturated fats, daily to have 5 or more servings of fruits and vegetables.  Education: All About Nutrition: -Group instruction provided by verbal, written material, interactive activities, discussions, models, and posters to present general guidelines for heart healthy nutrition including fat, fiber, MyPlate, the role of sodium in heart healthy nutrition, utilization of the nutrition label, and utilization of this knowledge for meal planning. Follow up email sent as well. Written material given at graduation.   Biometrics:  Pre Biometrics - 09/12/20 1252       Pre Biometrics   Height 5' 11.9" (1.826 m)    Weight 174 lb 6.4 oz (79.1 kg)    BMI (Calculated) 23.73    Single Leg Stand 0.5 seconds              Nutrition Therapy Plan and Nutrition Goals:  Nutrition Therapy & Goals - 09/19/20 1125       Nutrition Therapy   Diet Heart healthy, low Na    Drug/Food Interactions Statins/Certain Fruits    Protein (specify units) 65g    Fiber 30 grams    Whole Grain Foods 3 servings    Saturated Fats 12 max. grams    Fruits and Vegetables 8 servings/day    Sodium 1.5 grams      Personal Nutrition Goals   Nutrition Goal ST: continue [facility] with current changes. LT: continue to gain weight/muscle    Comments 2 meals per day. He has not been sleeping well since the hospital and in the last couple of days has gotten worse. Breakfast/lunch burrito in am (onions, red peppers, hashbrowns, veggie sausage, sour cream and salsa) S: fruit D: bowl of homemade vegetables  Drinks: lemonade -(sweet), tea (unsweet), water. RD gave him meals, psychologist has helped him with eating (mindset change). [facility] salmon [facility] and rice and broccoli)mashed potato, pintos, mac and cheese. Will sometimes make his own  tuna salad (1tbsp of each thing he will normally eat),. [employer] - potato  salad, pintos, and okra. will drink 1-2 ensure clear per day (usually 1x/day). He is gaining weight. He would like to continue with what he is doing.             Nutrition Assessments:  MEDIFICTS Score Key: ?70 Need to make dietary changes  40-70 Heart Healthy Diet ? 40 Therapeutic Level Cholesterol Diet  Flowsheet Row Cardiac Rehab from 09/12/2020 in [facility] Cardiac and Pulmonary Rehab  Picture Your Plate Total Score on Admission 83      Picture Your Plate Scores: <00 Unhealthy dietary pattern with much room for improvement. 41-50 Dietary pattern unlikely to meet recommendations for good health and room for improvement. 51-60 More healthful dietary pattern, with some room for improvement.  >60 Healthy dietary pattern, although there may be some specific behaviors that could be improved.    Nutrition Goals Re-Evaluation:   Nutrition Goals Discharge (Final Nutrition Goals Re-Evaluation):   Psychosocial: Target Goals: Acknowledge presence or absence of significant depression and/or stress, maximize coping skills, provide positive support system. Participant is able to verbalize types and ability to use techniques and skills needed for reducing stress and depression.   Education: Stress, Anxiety, and Depression - Group verbal and visual presentation to define topics covered.  Reviews how body is impacted by stress, anxiety, and depression.  Also discusses healthy ways to reduce stress and to treat/manage anxiety and depression.  Written material given at graduation. Flowsheet Row Cardiac Rehab from 09/14/2020 in [facility] Cardiac and Pulmonary Rehab  Date 09/14/20  Educator [facility]  Instruction Review Code 1- [employer] Understanding       Education: Sleep Hygiene -Provides group verbal and written instruction about how sleep can affect your health.  Define sleep hygiene, discuss sleep cycles and impact of sleep  habits. Review good sleep hygiene tips.    Initial Review & Psychosocial Screening:  Initial Psych Review & Screening - 08/22/20 1420       Initial Review   Current issues with Current Stress Concerns;History of Depression;Current Depression;Current Anxiety/Panic    Source of Stress Concerns Chronic Illness;Unable to perform yard/household activities;Unable to participate in former interests or hobbies      [place]? Yes      Barriers   Psychosocial barriers to participate in program There are no identifiable barriers or psychosocial needs.;The patient should benefit from training in stress management and relaxation.      Screening Interventions   Interventions Provide feedback about the scores to participant;Encouraged to exercise;To provide support and resources with identified psychosocial needs    Expected Outcomes Short Term goal: Utilizing psychosocial counselor, staff and physician to assist with identification of specific Stressors or current issues interfering with healing process. Setting desired goal for each stressor or current issue identified.;Long Term Goal: Stressors or current issues are controlled or eliminated.;Short Term goal: Identification and review with participant of any Quality of Life or Depression concerns found by scoring the questionnaire.;Long Term goal: The participant improves quality of Life and PHQ9 Scores as seen by post scores and/or verbalization of changes             Quality of Life Scores:   Quality of Life - 09/01/20 1536       Quality of Life   Select Quality of Life      Quality of Life Scores   Health/Function Pre 12.5 %    Socioeconomic Pre 23.63 %    Psych/Spiritual Pre 21.43 %    Family Pre  28.8 %    GLOBAL Pre 19.16 %            Scores of 19 and below usually indicate a poorer quality of life in these areas.  A difference of  2-3 points is a clinically meaningful difference.  A difference of  2-3 points in the total score of the Quality of Life Index has been associated with significant improvement in overall quality of life, self-image, physical symptoms, and general health in studies assessing change in quality of life.  PHQ-9: Recent Review Flowsheet Data     Depression screen [facility] 2/9 09/12/2020 08/24/2015   Decreased Interest 1 2   Down, Depressed, Hopeless 1 3   PHQ - 2 Score 2 5   Altered sleeping 3 1   Tired, decreased energy 3 2   Change in appetite 2 1   Feeling bad or failure about yourself  0 0   Trouble concentrating 3 1   Moving slowly or fidgety/restless 2 1   Suicidal thoughts 0 0   PHQ-9 Score 15 11   Difficult doing work/chores Somewhat difficult -      Interpretation of Total Score  Total Score Depression Severity:  1-4 = Minimal depression, 5-9 = Mild depression, 10-14 = Moderate depression, 15-19 = Moderately severe depression, 20-27 = Severe depression   Psychosocial Evaluation and Intervention:  Psychosocial Evaluation - 08/22/20 1444       Psychosocial Evaluation & Interventions   Interventions Encouraged to exercise with the program and follow exercise prescription;Stress management education;Relaxation education    Comments Marden Noble had a CABG in October of 2021 after being hospitalized for Sepsis related to a tooth infection. His wife states his cognitive abilities have become impaired since the sepsis so she is his main caregiver. She planned her cardiac rehab around him, so they will be coming together. 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 During PT he was told his strength is there, he needs to work on his stamina so he is excited to come to cardiac rehab to work on this. He has had a rough year and is ready to start feeling more like himself and start doing the things/hobbies he misses.    Expected Outcomes Short: attend cardiac rehab for education and exercise. Long:  develop positive self care habits.    Continue Psychosocial Services  Follow up required by staff             Psychosocial Re-Evaluation:   Psychosocial Discharge (Final Psychosocial Re-Evaluation):   Vocational Rehabilitation: Provide vocational rehab assistance to qualifying candidates.   Vocational Rehab Evaluation & Intervention:  Vocational Rehab - 08/22/20 1420       Initial Vocational Rehab Evaluation & Intervention   Assessment shows need for Vocational Rehabilitation No             Education: Education Goals: Education classes will be provided on a variety of topics geared toward better understanding of heart health and risk factor modification. Participant will state understanding/return demonstration of topics presented as noted by education test scores.  Learning Barriers/Preferences:  Learning Barriers/Preferences - 08/22/20 1416       Learning Barriers/Preferences   Learning Barriers None   some cognitive issues   Learning Preferences Individual Instruction             General Cardiac Education Topics:  AED/CPR: - Group verbal and written instruction with the use of  models to demonstrate the basic use of the AED with the basic ABC's of resuscitation.   Anatomy and Cardiac Procedures: - Group verbal and visual presentation and models provide information about basic cardiac anatomy and function. Reviews the testing methods done to diagnose heart disease and the outcomes of the test results. Describes the treatment choices: Medical Management, Angioplasty, or Coronary Bypass Surgery for treating various heart conditions including Myocardial Infarction, Angina, Valve Disease, and Cardiac Arrhythmias.  Written material given at graduation.   Medication Safety: - Group verbal and visual instruction to review commonly prescribed medications for heart and lung disease. Reviews the medication, class of the drug, and side effects. Includes the steps to  properly store meds and maintain the prescription regimen.  Written material given at graduation.   Intimacy: - Group verbal instruction through game format to discuss how heart and lung disease can affect sexual intimacy. Written material given at graduation..   Know Your Numbers and Heart Failure: - Group verbal and visual instruction to discuss disease risk factors for cardiac and pulmonary disease and treatment options.  Reviews associated critical values for Overweight/Obesity, Hypertension, Cholesterol, and Diabetes.  Discusses basics of heart failure: signs/symptoms and treatments.  Introduces Heart Failure Zone chart for action plan for heart failure.  Written material given at graduation.   Infection Prevention: - Provides verbal and written material to individual with discussion of infection control including proper hand washing and proper equipment cleaning during exercise session. Flowsheet Row Cardiac Rehab from 09/14/2020 in [facility] Cardiac and Pulmonary Rehab  Date 09/12/20  Educator [facility]  Instruction Review Code 1- Verbalizes Understanding       Falls Prevention: - Provides verbal and written material to individual with discussion of falls prevention and safety. Flowsheet Row Cardiac Rehab from 09/14/2020 in [facility] Cardiac and Pulmonary Rehab  Date 09/12/20  Educator [facility]  Instruction Review Code 1- Verbalizes Understanding       Other: -Provides group and verbal instruction on various topics (see comments)   Knowledge Questionnaire Score:  Knowledge Questionnaire Score - 09/01/20 1536       Knowledge Questionnaire Score   Pre Score 25/26             Core Components/Risk Factors/Patient Goals at Admission:  Personal Goals and Risk Factors at Admission - 09/12/20 1254       Core Components/Risk Factors/Patient Goals on Admission    Weight Management Yes;Weight Maintenance    Intervention Weight Management: Develop a combined nutrition and exercise program  designed to reach desired caloric intake, while maintaining appropriate intake of nutrient and fiber, sodium and fats, and appropriate energy expenditure required for the weight goal.;Weight Management: Provide education and appropriate resources to help participant work on and attain dietary goals.    Admit Weight 174 lb 6.4 oz (79.1 kg)    Goal Weight: Short Term 174 lb (78.9 kg)    Goal Weight: Long Term 174 lb (78.9 kg)    Expected Outcomes Short Term: Continue to assess and modify interventions until short term weight is achieved;Weight Maintenance: Understanding of the daily nutrition guidelines, which includes 25-35% calories from fat, 7% or less cal from saturated fats, less than 22m cholesterol, less than 1.5gm of sodium, & 5 or more servings of fruits and vegetables daily;Long Term: Adherence to nutrition and physical activity/exercise program aimed toward attainment of established weight goal    Hypertension Yes    Intervention Monitor prescription use compliance.;Provide education on lifestyle modifcations including regular physical activity/exercise, weight management,  moderate sodium restriction and increased consumption of fresh fruit, vegetables, and low fat dairy, alcohol moderation, and smoking cessation.    Expected Outcomes Short Term: Continued assessment and intervention until BP is < 140/18m HG in hypertensive participants. < 130/823mHG in hypertensive participants with diabetes, heart failure or chronic kidney disease.;Long Term: Maintenance of blood pressure at goal levels.             Education:Diabetes - Individual verbal and written instruction to review signs/symptoms of diabetes, desired ranges of glucose level fasting, after meals and with exercise. Acknowledge that pre and post exercise glucose checks will be done for 3 sessions at entry of program.   Core Components/Risk Factors/Patient Goals Review:    Core Components/Risk Factors/Patient Goals at Discharge  (Final Review):    ITP Comments:  ITP Comments     Row Name 08/22/20 1443 09/12/20 1249 09/14/20 1000 09/21/20 0705     ITP Comments Initial telephone orientation completed. [place] [place] EP orientation scheduled for Monday 5/23 at 10:30am. Completed 6MWT and gym orientation. Initial ITP created and sent for review to Dr. MaEmily FilbertMedical Director. First full day of exercise!  Patient was oriented to gym and equipment including functions, settings, policies, and procedures.  Patient's individual exercise prescription and treatment plan were reviewed.  All starting workloads were established based on the results of the 6 minute walk test done at initial orientation visit.  The plan for exercise progression was also introduced and progression will be customized based on patient's performance and goals. 30 Day review completed. Medical Director ITP review done, changes made as directed, and signed approval by Medical Director.   New to program             Comments:

## 2020-09-26 ENCOUNTER — Encounter: Payer: No Typology Code available for payment source | Admitting: *Deleted

## 2020-09-26 ENCOUNTER — Other Ambulatory Visit: Payer: Self-pay

## 2020-09-26 DIAGNOSIS — Z951 Presence of aortocoronary bypass graft: Secondary | ICD-10-CM | POA: Diagnosis not present

## 2020-09-26 NOTE — Progress Notes (Signed)
Daily Session Note  Patient Details  Name: Dalton Roy MRN: [id number] Date of Birth: Nov 06, 1940 Referring Provider:   Flowsheet Row Cardiac Rehab from 09/12/2020 in [facility] Cardiac and Pulmonary Rehab  Referring Provider Tammy Sours ([place])       Encounter Date: 09/26/2020  Check In:  Session Check In - 09/26/20 1111       Check-In   Supervising physician immediately available to respond to emergencies See telemetry face sheet for immediately available ER MD    Location [facility]-Cardiac & Pulmonary Rehab    Staff Present Renita Papa, RN BSN;Joseph [address] [place], MPA, [place] Area, BS, ACSM CEP, Exercise Physiologist    Virtual Visit No    Medication changes reported     No    Fall or balance concerns reported    No    Warm-up and Cool-down Performed on first and last piece of equipment    Resistance Training Performed Yes    VAD Patient? No    PAD/SET Patient? No      Pain Assessment   Currently in Pain? No/denies                Social History   Tobacco Use  Smoking Status Former   Pack years: 0.00   Types: Cigarettes   Quit date: 04/09/1966   Years since quitting: 54.5  Smokeless Tobacco Former    Goals Met:  Independence with exercise equipment Exercise tolerated well No report of cardiac concerns or symptoms Strength training completed today  Goals Unmet:  Not Applicable  Comments: Pt able to follow exercise prescription today without complaint.  Will continue to monitor for progression.    Dr. Emily Filbert is Medical Director for [place].  Dr. Ottie Glazier is Medical Director for [facility] Pulmonary Rehabilitation.

## 2020-09-28 ENCOUNTER — Other Ambulatory Visit: Payer: Self-pay

## 2020-09-28 DIAGNOSIS — Z951 Presence of aortocoronary bypass graft: Secondary | ICD-10-CM

## 2020-09-28 NOTE — Progress Notes (Signed)
Daily Session Note  Patient Details  Name: Dalton Roy MRN: [id number] Date of Birth: 07-Feb-1941 Referring Provider:   Flowsheet Row Cardiac Rehab from 09/12/2020 in [facility] Cardiac and Pulmonary Rehab  Referring Provider Tammy Sours ([place])       Encounter Date: 09/28/2020  Check In:  Session Check In - 09/28/20 1054       Check-In   Supervising physician immediately available to respond to emergencies See telemetry face sheet for immediately available ER MD    Location [facility]-Cardiac & Pulmonary Rehab    Staff Present Birdie Sons, MPA, Elveria Rising, BA, ACSM CEP, Exercise Physiologist;Joseph Tessie Fass RCP,RRT,BSRT    Virtual Visit No    Medication changes reported     No    Fall or balance concerns reported    No    Warm-up and Cool-down Performed on first and last piece of equipment    Resistance Training Performed Yes    VAD Patient? No    PAD/SET Patient? No      Pain Assessment   Currently in Pain? No/denies                Social History   Tobacco Use  Smoking Status Former   Pack years: 0.00   Types: Cigarettes   Quit date: 04/09/1966   Years since quitting: 54.5  Smokeless Tobacco Former    Goals Met:  Independence with exercise equipment Exercise tolerated well No report of cardiac concerns or symptoms Strength training completed today  Goals Unmet:  Not Applicable  Comments: Pt able to follow exercise prescription today without complaint.  Will continue to monitor for progression.    Dr. Emily Filbert is Medical Director for [place].  Dr. Ottie Glazier is Medical Director for [facility] Pulmonary Rehabilitation.

## 2020-09-30 ENCOUNTER — Other Ambulatory Visit: Payer: Self-pay

## 2020-09-30 ENCOUNTER — Encounter: Payer: No Typology Code available for payment source | Admitting: *Deleted

## 2020-09-30 DIAGNOSIS — Z951 Presence of aortocoronary bypass graft: Secondary | ICD-10-CM | POA: Diagnosis not present

## 2020-09-30 NOTE — Progress Notes (Signed)
Daily Session Note  Patient Details  Name: CODIE KROGH MRN: [id number] Date of Birth: August 04, 1940 Referring Provider:   Flowsheet Row Cardiac Rehab from 09/12/2020 in [facility] Cardiac and Pulmonary Rehab  Referring Provider Tammy Sours ([place])       Encounter Date: 09/30/2020  Check In:  Session Check In - 09/30/20 1125       Check-In   Supervising physician immediately available to respond to emergencies See telemetry face sheet for immediately available ER MD    Location [facility]-Cardiac & Pulmonary Rehab    Staff Present Renita Papa, RN BSN;Joseph [address] [place], [place], RCEP, CCRP, CCET    Virtual Visit No    Medication changes reported     No    Fall or balance concerns reported    No    Warm-up and Cool-down Performed on first and last piece of equipment    Resistance Training Performed Yes    VAD Patient? No    PAD/SET Patient? No      Pain Assessment   Currently in Pain? No/denies                Social History   Tobacco Use  Smoking Status Former   Pack years: 0.00   Types: Cigarettes   Quit date: 04/09/1966   Years since quitting: 54.5  Smokeless Tobacco Former    Goals Met:  Independence with exercise equipment Exercise tolerated well No report of cardiac concerns or symptoms Strength training completed today  Goals Unmet:  Not Applicable  Comments: Pt able to follow exercise prescription today without complaint.  Will continue to monitor for progression.    Dr. Emily Filbert is Medical Director for [place].  Dr. Ottie Glazier is Medical Director for [facility] Pulmonary Rehabilitation.

## 2020-10-03 ENCOUNTER — Other Ambulatory Visit: Payer: Self-pay

## 2020-10-03 ENCOUNTER — Encounter: Payer: No Typology Code available for payment source | Admitting: *Deleted

## 2020-10-03 DIAGNOSIS — Z951 Presence of aortocoronary bypass graft: Secondary | ICD-10-CM

## 2020-10-03 NOTE — Progress Notes (Signed)
Daily Session Note  Patient Details  Name: Dalton Roy MRN: [id number] Date of Birth: 1940/05/07 Referring Provider:   Flowsheet Row Cardiac Rehab from 09/12/2020 in [facility] Cardiac and Pulmonary Rehab  Referring Provider Tammy Sours ([place])       Encounter Date: 10/03/2020  Check In:  Session Check In - 10/03/20 1112       Check-In   Supervising physician immediately available to respond to emergencies See telemetry face sheet for immediately available ER MD    Location [facility]-Cardiac & Pulmonary Rehab    Staff Present Renita Papa, RN BSN;Joseph [address] [place], MPA, RN;Amanda Oletta Darter, [place], ACSM CEP, Exercise Physiologist    Virtual Visit No    Medication changes reported     No    Fall or balance concerns reported    No    Warm-up and Cool-down Performed on first and last piece of equipment    Resistance Training Performed Yes    VAD Patient? No    PAD/SET Patient? No      Pain Assessment   Currently in Pain? No/denies                Social History   Tobacco Use  Smoking Status Former   Pack years: 0.00   Types: Cigarettes   Quit date: 04/09/1966   Years since quitting: 54.5  Smokeless Tobacco Former    Goals Met:  Independence with exercise equipment Exercise tolerated well No report of cardiac concerns or symptoms Strength training completed today  Goals Unmet:  Not Applicable  Comments: Pt able to follow exercise prescription today without complaint.  Will continue to monitor for progression.    Dr. Emily Filbert is Medical Director for [place].  Dr. Ottie Glazier is Medical Director for [facility] Pulmonary Rehabilitation.

## 2020-10-07 ENCOUNTER — Encounter: Payer: No Typology Code available for payment source | Attending: Family Medicine | Admitting: *Deleted

## 2020-10-07 ENCOUNTER — Other Ambulatory Visit: Payer: Self-pay

## 2020-10-07 DIAGNOSIS — Z951 Presence of aortocoronary bypass graft: Secondary | ICD-10-CM | POA: Diagnosis not present

## 2020-10-07 DIAGNOSIS — Z48812 Encounter for surgical aftercare following surgery on the circulatory system: Secondary | ICD-10-CM | POA: Diagnosis present

## 2020-10-07 NOTE — Progress Notes (Signed)
Daily Session Note  Patient Details  Name: Dalton Roy MRN: [id number] Date of Birth: Aug 13, 1940 Referring Provider:   Flowsheet Row Cardiac Rehab from 09/12/2020 in [facility] Cardiac and Pulmonary Rehab  Referring Provider Tammy Sours ([place])       Encounter Date: 10/07/2020  Check In:  Session Check In - 10/07/20 1115       Check-In   Supervising physician immediately available to respond to emergencies See telemetry face sheet for immediately available ER MD    Location [facility]-Cardiac & Pulmonary Rehab    Staff Present Renita Papa, RN BSN;Joseph [place], RCP,RRT,BSRT;Jessica [place], [place], RCEP, CCRP, CCET    Virtual Visit No    Medication changes reported     No    Fall or balance concerns reported    No    Warm-up and Cool-down Performed on first and last piece of equipment    Resistance Training Performed Yes    VAD Patient? No    PAD/SET Patient? No      Pain Assessment   Currently in Pain? No/denies                Social History   Tobacco Use  Smoking Status Former   Pack years: 0.00   Types: Cigarettes   Quit date: 04/09/1966   Years since quitting: 54.5  Smokeless Tobacco Former    Goals Met:  Independence with exercise equipment Exercise tolerated well No report of cardiac concerns or symptoms Strength training completed today  Goals Unmet:  Not Applicable  Comments: Pt able to follow exercise prescription today without complaint.  Will continue to monitor for progression.    Dr. Emily Filbert is Medical Director for [place].  Dr. Ottie Glazier is Medical Director for [facility] Pulmonary Rehabilitation.

## 2020-10-12 ENCOUNTER — Other Ambulatory Visit: Payer: Self-pay

## 2020-10-12 DIAGNOSIS — Z48812 Encounter for surgical aftercare following surgery on the circulatory system: Secondary | ICD-10-CM | POA: Diagnosis not present

## 2020-10-12 DIAGNOSIS — Z951 Presence of aortocoronary bypass graft: Secondary | ICD-10-CM

## 2020-10-12 NOTE — Progress Notes (Signed)
Daily Session Note  Patient Details  Name: Dalton Roy MRN: [id number] Date of Birth: 11/24/1940 Referring Provider:   Flowsheet Row Cardiac Rehab from 09/12/2020 in [facility] Cardiac and Pulmonary Rehab  Referring Provider [place] ([place])       Encounter Date: 10/12/2020  Check In:  Session Check In - 10/12/20 1035       Check-In   Supervising physician immediately available to respond to emergencies See telemetry face sheet for immediately available ER MD    Location [facility]-Cardiac & Pulmonary Rehab    Staff Present Kelly Bollinger, MPA, RN;Laureen Brown, BS, RRT, CPFT;Joseph Hood, RCP,RRT,BSRT    Virtual Visit No    Medication changes reported     No    Fall or balance concerns reported    No    Warm-up and Cool-down Performed on first and last piece of equipment    Resistance Training Performed Yes    VAD Patient? No    PAD/SET Patient? No      Pain Assessment   Currently in Pain? No/denies                Social History   Tobacco Use  Smoking Status Former   Pack years: 0.00   Types: Cigarettes   Quit date: 04/09/1966   Years since quitting: 54.5  Smokeless Tobacco Former    Goals Met:  Independence with exercise equipment Exercise tolerated well No report of cardiac concerns or symptoms Strength training completed today  Goals Unmet:  Not Applicable  Comments: Pt able to follow exercise prescription today without complaint.  Will continue to monitor for progression.    Dr. Mark Miller is Medical Director for [facility] Cardiac Rehabilitation.  Dr. Fuad Aleskerov is Medical Director for [facility] Pulmonary Rehabilitation. 

## 2020-10-14 ENCOUNTER — Other Ambulatory Visit: Payer: Self-pay

## 2020-10-14 ENCOUNTER — Encounter: Payer: No Typology Code available for payment source | Admitting: *Deleted

## 2020-10-14 DIAGNOSIS — Z48812 Encounter for surgical aftercare following surgery on the circulatory system: Secondary | ICD-10-CM | POA: Diagnosis not present

## 2020-10-14 DIAGNOSIS — Z951 Presence of aortocoronary bypass graft: Secondary | ICD-10-CM

## 2020-10-14 NOTE — Progress Notes (Signed)
Daily Session Note  Patient Details  Name: Dalton Roy MRN: [id number] Date of Birth: 1941-01-06 Referring Provider:   Flowsheet Row Cardiac Rehab from 09/12/2020 in [facility] Cardiac and Pulmonary Rehab  Referring Provider Tammy Sours ([place])       Encounter Date: 10/14/2020  Check In:  Session Check In - 10/14/20 1116       Check-In   Supervising physician immediately available to respond to emergencies See telemetry face sheet for immediately available ER MD    Location [facility]-Cardiac & Pulmonary Rehab    Staff Present Renita Papa, RN BSN;Jessica Luan [place], MA, RCEP, CCRP, CCET;Melissa [place], [place], LDN    Virtual Visit No    Medication changes reported     No    Fall or balance concerns reported    No    Warm-up and Cool-down Performed on first and last piece of equipment    Resistance Training Performed Yes    VAD Patient? No    PAD/SET Patient? No      Pain Assessment   Currently in Pain? No/denies                Social History   Tobacco Use  Smoking Status Former   Pack years: 0.00   Types: Cigarettes   Quit date: 04/09/1966   Years since quitting: 54.5  Smokeless Tobacco Former    Goals Met:  Independence with exercise equipment Exercise tolerated well No report of cardiac concerns or symptoms Strength training completed today  Goals Unmet:  Not Applicable  Comments: Pt able to follow exercise prescription today without complaint.  Will continue to monitor for progression.'    Dr. Emily Filbert is Medical Director for [place].  Dr. Ottie Glazier is Medical Director for [facility] Pulmonary Rehabilitation.

## 2020-10-17 ENCOUNTER — Other Ambulatory Visit: Payer: Self-pay

## 2020-10-17 ENCOUNTER — Encounter: Payer: No Typology Code available for payment source | Admitting: *Deleted

## 2020-10-17 DIAGNOSIS — Z951 Presence of aortocoronary bypass graft: Secondary | ICD-10-CM

## 2020-10-17 DIAGNOSIS — Z48812 Encounter for surgical aftercare following surgery on the circulatory system: Secondary | ICD-10-CM | POA: Diagnosis not present

## 2020-10-17 NOTE — Progress Notes (Signed)
Daily Session Note  Patient Details  Name: Dalton Roy MRN: [id number] Date of Birth: 13-Nov-1940 Referring Provider:   Flowsheet Row Cardiac Rehab from 09/12/2020 in [facility] Cardiac and Pulmonary Rehab  Referring Provider Tammy Sours ([place])       Encounter Date: 10/17/2020  Check In:  Session Check In - 10/17/20 1052       Check-In   Supervising physician immediately available to respond to emergencies See telemetry face sheet for immediately available ER MD    Location [facility]-Cardiac & Pulmonary Rehab    Staff Present Renita Papa, RN BSN;Kristen Coble, RN,BC,MSN;Kelly [place], MPA, [place], BS, ACSM CEP, Exercise Physiologist    Virtual Visit No    Medication changes reported     No    Fall or balance concerns reported    No    Warm-up and Cool-down Performed on first and last piece of equipment    Resistance Training Performed Yes    VAD Patient? No    PAD/SET Patient? No      Pain Assessment   Currently in Pain? No/denies                Social History   Tobacco Use  Smoking Status Former   Pack years: 0.00   Types: Cigarettes   Quit date: 04/09/1966   Years since quitting: 54.5  Smokeless Tobacco Former    Goals Met:  Independence with exercise equipment Exercise tolerated well No report of cardiac concerns or symptoms Strength training completed today  Goals Unmet:  Not Applicable  Comments: Pt able to follow exercise prescription today without complaint.  Will continue to monitor for progression.    Dr. Emily Filbert is Medical Director for [place].  Dr. Ottie Glazier is Medical Director for [facility] Pulmonary Rehabilitation.

## 2020-10-19 ENCOUNTER — Other Ambulatory Visit: Payer: Self-pay

## 2020-10-19 ENCOUNTER — Encounter: Payer: No Typology Code available for payment source | Admitting: *Deleted

## 2020-10-19 ENCOUNTER — Encounter: Payer: Self-pay | Admitting: *Deleted

## 2020-10-19 DIAGNOSIS — Z48812 Encounter for surgical aftercare following surgery on the circulatory system: Secondary | ICD-10-CM | POA: Diagnosis not present

## 2020-10-19 DIAGNOSIS — Z951 Presence of aortocoronary bypass graft: Secondary | ICD-10-CM

## 2020-10-19 NOTE — Progress Notes (Signed)
Cardiac Individual Treatment Plan  Patient Details  Name: Dalton Roy MRN: [id number] Date of Birth: 1940-05-20 Referring Provider:   Flowsheet Row Cardiac Rehab from 09/12/2020 in [facility] Cardiac and Pulmonary Rehab  Referring Provider Tammy Sours ([place])       Initial Encounter Date:  Flowsheet Row Cardiac Rehab from 09/12/2020 in [facility] Cardiac and Pulmonary Rehab  Date 09/12/20       Visit Diagnosis: S/P CABG x 3  Patient's Home Medications on Admission:  Current Outpatient Medications:    acetaminophen (TYLENOL) 325 MG tablet, Take 975 mg by mouth every 8 (eight) hours as needed., Disp: , Rfl:    allopurinol (ZYLOPRIM) 300 MG tablet, TAKE 1 TABLET(300 MG) BY MOUTH DAILY (Patient taking differently: Take 100 mg by mouth 3 (three) times a week. Every Monday, Wednesday and Friday.), Disp: 90 tablet, Rfl: 1   apixaban (ELIQUIS) 5 MG TABS tablet, Take 5 mg by mouth every 12 (twelve) hours., Disp: , Rfl:    aspirin EC 81 MG tablet, Take 81 mg by mouth daily with lunch., Disp: , Rfl:    atorvastatin (LIPITOR) 40 MG tablet, Take 20 mg by mouth See admin instructions. Take 20 mg at bedtime for 20 days, then take 40 mg at bedtime., Disp: , Rfl:    bisacodyl (DULCOLAX) 10 MG suppository, Place 10 mg rectally daily as needed for moderate constipation., Disp: , Rfl:    capsaicin (ZOSTRIX) 0.025 % cream, Apply 1 application topically every 12 (twelve) hours as needed (pain below right scapula)., Disp: , Rfl:    doxycycline (VIBRA-TABS) 100 MG tablet, Take 1 tablet (100 mg total) by mouth 2 (two) times daily., Disp: 20 tablet, Rfl: 0   ferrous sulfate 325 (65 FE) MG tablet, Take 325 mg by mouth at bedtime., Disp: , Rfl:    gabapentin (NEURONTIN) 300 MG capsule, Take 300 mg by mouth at bedtime., Disp: , Rfl:    hydrocortisone 2.5 % cream, Apply 1 application topically 3 (three) times daily as needed (hemorrhoidal itching)., Disp: , Rfl:    levothyroxine (SYNTHROID, LEVOTHROID) 75 MCG tablet, Take 1  tablet (75 mcg total) by mouth daily before breakfast. (Patient taking differently: Take 75 mcg by mouth daily.), Disp: 90 tablet, Rfl: 3   melatonin 5 MG TABS, Take 5 mg by mouth at bedtime as needed (sleep)., Disp: , Rfl:    metoprolol tartrate (LOPRESSOR) 25 MG tablet, Take 12.5 mg by mouth every 12 (twelve) hours., Disp: , Rfl:    mirtazapine (REMERON) 30 MG tablet, Take 30 mg by mouth at bedtime., Disp: , Rfl:    sodium bicarbonate 650 MG tablet, Take 650 mg by mouth 4 (four) times daily., Disp: , Rfl:    white petrolatum ointment, Apply 1 application topically in the morning and at bedtime., Disp: , Rfl:   Past Medical History: Past Medical History:  Diagnosis Date   Bipolar 1 disorder ([place])    Cancer ([place]) 20 years   skin   Gout    Heart murmur    Hypertension    Hypothyroidism    ITP (idiopathic thrombocytopenic purpura)    Migraines    Psoriasis     Tobacco Use: Social History   Tobacco Use  Smoking Status Former   Pack years: 0.00   Types: Cigarettes   Quit date: 04/09/1966   Years since quitting: 54.5  Smokeless Tobacco Former    Labs: Recent Government social research officer for [employer] Cardiac and Pulmonary Rehab Latest Ref  Rng & Units 08/11/2015   Cholestrol 0 - 200 mg/dL 179   LDLCALC 0 - 99 mg/dL 107(H)   HDL >39.00 mg/dL 34.40(L)   Trlycerides 0.0 - 149.0 mg/dL 189.0(H)        Exercise Target Goals: Exercise Program Goal: Individual exercise prescription set using results from initial 6 min walk test and THRR while considering  patient's activity barriers and safety.   Exercise Prescription Goal: Initial exercise prescription builds to 30-45 minutes a day of aerobic activity, 2-3 days per week.  Home exercise guidelines will be given to patient during program as part of exercise prescription that the participant will acknowledge.   Education: Aerobic Exercise: - Group verbal and visual presentation on the components of exercise prescription. Introduces  F.I.T.T principle from ACSM for exercise prescriptions.  Reviews F.I.T.T. principles of aerobic exercise including progression. Written material given at graduation. Flowsheet Row Cardiac Rehab from 10/12/2020 in [facility] Cardiac and Pulmonary Rehab  Education need identified 09/12/20  Date 09/28/20  Educator [facility]  Instruction Review Code 1- Verbalizes Understanding       Education: Resistance Exercise: - Group verbal and visual presentation on the components of exercise prescription. Introduces F.I.T.T principle from ACSM for exercise prescriptions  Reviews F.I.T.T. principles of resistance exercise including progression. Written material given at graduation.    Education: Exercise & Equipment Safety: - Individual verbal instruction and demonstration of equipment use and safety with use of the equipment. Flowsheet Row Cardiac Rehab from 10/12/2020 in [facility] Cardiac and Pulmonary Rehab  Date 09/12/20  Educator [facility]  Instruction Review Code 1- Verbalizes Understanding       Education: Exercise Physiology & General Exercise Guidelines: - Group verbal and written instruction with models to review the exercise physiology of the cardiovascular system and associated critical values. Provides general exercise guidelines with specific guidelines to those with heart or lung disease.  Flowsheet Row Cardiac Rehab from 10/12/2020 in [facility] Cardiac and Pulmonary Rehab  Date 09/21/20  Educator [facility]  Instruction Review Code 1- Verbalizes Understanding       Education: Flexibility, Balance, Mind/Body Relaxation: - Group verbal and visual presentation with interactive activity on the components of exercise prescription. Introduces F.I.T.T principle from ACSM for exercise prescriptions. Reviews F.I.T.T. principles of flexibility and balance exercise training including progression. Also discusses the mind body connection.  Reviews various relaxation techniques to help reduce and manage stress (i.e. Deep breathing,  progressive muscle relaxation, and visualization). Balance handout provided to take home. Written material given at graduation. Flowsheet Row Cardiac Rehab from 10/12/2020 in [facility] Cardiac and Pulmonary Rehab  Date 10/12/20  Educator [facility]  Instruction Review Code 1- Verbalizes Understanding       Activity Barriers & Risk Stratification:  Activity Barriers & Cardiac Risk Stratification - 08/22/20 1412       Activity Barriers & Cardiac Risk Stratification   Activity Barriers Muscular Weakness;Assistive Device;Other (comment)    Comments uses cane at times; some cognitive impairment (wife states its not noticeable to many people) post sepsis    Cardiac Risk Stratification High             6 Minute Walk:  6 Minute Walk     Row Name 09/12/20 1249         6 Minute Walk   Phase Initial     Distance 990 feet     Walk Time 6 minutes     # of Rest Breaks 0     MPH 1.88     METS  1.97     RPE 13     Perceived Dyspnea  1     VO2 Peak 6.91     Symptoms Yes (comment)     Comments SOB     Resting HR 75 bpm     Resting BP 110/62     Resting Oxygen Saturation  98 %     Exercise Oxygen Saturation  during 6 min walk 97 %     Max Ex. HR 79 bpm     Max Ex. BP 128/70     2 Minute Post BP 106/56              Oxygen Initial Assessment:   Oxygen Re-Evaluation:   Oxygen Discharge (Final Oxygen Re-Evaluation):   Initial Exercise Prescription:  Initial Exercise Prescription - 09/12/20 1200       Date of Initial Exercise RX and Referring Provider   Date 09/12/20    Referring Provider [place]my [place]ours ([place])      Recumbant Bike   Level 1    RPM 50    Watts 5    Minutes 15    METs 1.5      NuStep   Level 1    SPM 80    Minutes 15    METs 1.5      Track   Laps 25    Minutes 15    METs 2.4      Prescription Details   Frequency (times per week) 3    Duration Progress to 30 minutes of continuous aerobic without signs/symptoms of physical distress      Intensity    THRR 40-80% of Max Heartrate 101-128    Ratings of Perceived Exertion 11-13    Perceived Dyspnea 0-4      Progression   Progression Continue to progress workloads to maintain intensity without signs/symptoms of physical distress.      Resistance Training   Training Prescription Yes    Weight 4 lb    Reps 10-15             Perform Capillary Blood Glucose checks as needed.  Exercise Prescription Changes:   Exercise Prescription Changes     Row Name 09/12/20 1200 09/21/20 1300 10/06/20 1400         Response to Exercise   Blood Pressure (Admit) 110/62 102/80 122/68     Blood Pressure (Exercise) 128/70 128/68 148/72     Blood Pressure (Exit) 106/56 124/72 138/72     Heart Rate (Admit) 75 bpm 94 bpm 98 bpm     Heart Rate (Exercise) 79 bpm 109 bpm 111 bpm     Heart Rate (Exit) 83 bpm 75 bpm 104 bpm     Oxygen Saturation (Admit) 98 % -- --     Oxygen Saturation (Exercise) 97 % -- --     Rating of Perceived Exertion (Exercise) _0 Perceived Dyspnea (Exercise) 1 -- --     Symptoms SOB -- --     Comments walk test results -- --     Duration -- Continue with 30 min of aerobic exercise without signs/symptoms of physical distress. Continue with 30 min of aerobic exercise without signs/symptoms of physical distress.     Intensity -- THRR unchanged THRR unchanged           Progression       Progression -- -- Continue to progress workloads to maintain intensity without signs/symptoms of physical distress.  Average METs -- -- 3.2           Resistance Training       Training Prescription -- Yes Yes     Weight -- 3 lb 4 lb     Reps -- 10-15 10-15           NuStep       Level -- 4 4     SPM -- 80 80     Minutes -- 15 15     METs -- 2.5 3.2           Track       Laps -- 30 26     Minutes -- 15 15     METs -- 2.6 2.41             Exercise Comments:   Exercise Comments     Row Name 09/14/20 1000           Exercise Comments First full day of  exercise!  Patient was oriented to gym and equipment including functions, settings, policies, and procedures.  Patient's individual exercise prescription and treatment plan were reviewed.  All starting workloads were established based on the results of the 6 minute walk test done at initial orientation visit.  The plan for exercise progression was also introduced and progression will be customized based on patient's performance and goals.                Exercise Goals and Review:   Exercise Goals     Row Name 09/12/20 1251             Exercise Goals   Increase Physical Activity Yes       Intervention Provide advice, education, support and counseling about physical activity/exercise needs.;Develop an individualized exercise prescription for aerobic and resistive training based on initial evaluation findings, risk stratification, comorbidities and participant's personal goals.       Expected Outcomes Short Term: Attend rehab on a regular basis to increase amount of physical activity.;Long Term: Add in home exercise to make exercise part of routine and to increase amount of physical activity.;Long Term: Exercising regularly at least 3-5 days a week.       Increase Strength and Stamina Yes       Intervention Provide advice, education, support and counseling about physical activity/exercise needs.;Develop an individualized exercise prescription for aerobic and resistive training based on initial evaluation findings, risk stratification, comorbidities and participant's personal goals.       Expected Outcomes Short Term: Increase workloads from initial exercise prescription for resistance, speed, and METs.;Short Term: Perform resistance training exercises routinely during rehab and add in resistance training at home;Long Term: Improve cardiorespiratory fitness, muscular endurance and strength as measured by increased METs and functional capacity (6MWT)       Able to understand and use rate of  perceived exertion (RPE) scale Yes       Intervention Provide education and explanation on how to use RPE scale       Expected Outcomes Short Term: Able to use RPE daily in rehab to express subjective intensity level;Long Term:  Able to use RPE to guide intensity level when exercising independently       Able to understand and use Dyspnea scale Yes       Intervention Provide education and explanation on how to use Dyspnea scale       Expected Outcomes Short Term: Able to use Dyspnea scale daily in rehab to express subjective  sense of shortness of breath during exertion;Long Term: Able to use Dyspnea scale to guide intensity level when exercising independently       Knowledge and understanding of Target Heart Rate Range (THRR) Yes       Intervention Provide education and explanation of THRR including how the numbers were predicted and where they are located for reference       Expected Outcomes Short Term: Able to state/look up THRR;Short Term: Able to use daily as guideline for intensity in rehab;Long Term: Able to use THRR to govern intensity when exercising independently       Able to check pulse independently Yes       Intervention Provide education and demonstration on how to check pulse in carotid and radial arteries.;Review the importance of being able to check your own pulse for safety during independent exercise       Expected Outcomes Short Term: Able to explain why pulse checking is important during independent exercise;Long Term: Able to check pulse independently and accurately       Understanding of Exercise Prescription Yes       Intervention Provide education, explanation, and written materials on patient's individual exercise prescription       Expected Outcomes Short Term: Able to explain program exercise prescription;Long Term: Able to explain home exercise prescription to exercise independently                Exercise Goals Re-Evaluation :  Exercise Goals Re-Evaluation      Row Name 09/14/20 1000 09/21/20 1357 10/06/20 1419         Exercise Goal Re-Evaluation   Exercise Goals Review Increase Physical Activity;Able to understand and use rate of perceived exertion (RPE) scale;Knowledge and understanding of Target Heart Rate Range (THRR);Understanding of Exercise Prescription;Increase Strength and Stamina;Able to understand and use Dyspnea scale;Able to check pulse independently Increase Physical Activity;Increase Strength and Stamina Increase Physical Activity;Increase Strength and Stamina     Comments Reviewed RPE and dyspnea scales, THR and program prescription with pt today.  Pt voiced understanding and was given a copy of goals to take home. Marden Noble is progressing well.  He moved up to level 4 on NS and did 30 laps on track!  We will continue to monitor progress. Marden Noble is at the same level since last review.  Staff will encourage increasing level on seated equipment.     Expected Outcomes Short: Use RPE daily to regulate intensity. Long: Follow program prescription in THR. Short: attend consistently Long:  increase stamina Short: increase load on seated equipment Long:  improve overall MET level              Discharge Exercise Prescription (Final Exercise Prescription Changes):  Exercise Prescription Changes - 10/06/20 1400       Response to Exercise   Blood Pressure (Admit) 122/68    Blood Pressure (Exercise) 148/72    Blood Pressure (Exit) 138/72    Heart Rate (Admit) 98 bpm    Heart Rate (Exercise) 111 bpm    Heart Rate (Exit) 104 bpm    Rating of Perceived Exertion (Exercise) 14    Duration Continue with 30 min of aerobic exercise without signs/symptoms of physical distress.    Intensity THRR unchanged      Progression   Progression Continue to progress workloads to maintain intensity without signs/symptoms of physical distress.    Average METs 3.2      Resistance Training   Training Prescription Yes    Weight 4  lb    Reps 10-15      NuStep    Level 4    SPM 80    Minutes 15    METs 3.2      Track   Laps 26    Minutes 15    METs 2.41             Nutrition:  Target Goals: Understanding of nutrition guidelines, daily intake of sodium <1543m, cholesterol <202m calories 30% from fat and 7% or less from saturated fats, daily to have 5 or more servings of fruits and vegetables.  Education: All About Nutrition: -Group instruction provided by verbal, written material, interactive activities, discussions, models, and posters to present general guidelines for heart healthy nutrition including fat, fiber, MyPlate, the role of sodium in heart healthy nutrition, utilization of the nutrition label, and utilization of this knowledge for meal planning. Follow up email sent as well. Written material given at graduation.   Biometrics:  Pre Biometrics - 09/12/20 1252       Pre Biometrics   Height 5' 11.9" (1.826 m)    Weight 174 lb 6.4 oz (79.1 kg)    BMI (Calculated) 23.73    Single Leg Stand 0.5 seconds              Nutrition Therapy Plan and Nutrition Goals:  Nutrition Therapy & Goals - 09/19/20 1125       Nutrition Therapy   Diet Heart healthy, low Na    Drug/Food Interactions Statins/Certain Fruits    Protein (specify units) 65g    Fiber 30 grams    Whole Grain Foods 3 servings    Saturated Fats 12 max. grams    Fruits and Vegetables 8 servings/day    Sodium 1.5 grams      Personal Nutrition Goals   Nutrition Goal ST: [facility] with [facility] changes. LT: continue to gain weight/muscle    Comments 2 meals per day. He has not been sleeping well since the hospital and in the last couple of days has gotten worse. Breakfast/lunch burrito in am (onions, red peppers, hashbrowns, veggie sausage, sour cream and salsa) S: fruit D: bowl of homemade vegetables  Drinks: lemonade -(sweet), tea (unsweet), water. RD gave him meals, psychologist has helped him with eating (mindset change). [facility] and broccoli)mashed potato, pintos, mac and cheese. Will sometimes make his own tuna salad (1tbsp of each thing he will normally eat),. [place] [employer] - potato salad, pintos, and okra. will drink 1-2 ensure clear per day (usually 1x/day). He is gaining weight. He would like to continue with what he is doing.             Nutrition Assessments:  MEDIFICTS Score Key: ?70 Need to make dietary changes  40-70 Heart Healthy Diet ? 40 Therapeutic Level Cholesterol Diet  [facility]  Picture Your Plate Total Score on Admission 83      Picture Your Plate Scores: <4<88nhealthy dietary pattern with much room for improvement. 41-50 Dietary pattern unlikely to meet recommendations for good health and room for improvement. 51-60 More healthful dietary pattern, with some room for improvement.  >60 Healthy dietary pattern, although there may be some specific behaviors that could be improved.    Nutrition Goals Re-Evaluation:  Nutrition Goals Re-Evaluation     [place] 10/07/20 1113  Goals   Current Weight 177 lb (80.3 kg)       Nutrition Goal ST: continue following RD at [facility], continue with current changes. LT: continue to gain weight/muscle       Comment Marden Noble is following his diet given by the [place] and is working on gaining some muscle.       Expected Outcome Short term: continue following diet routine from [place]. Long term: continue to gain muscle                Nutrition Goals Discharge (Final Nutrition Goals Re-Evaluation):  Nutrition Goals Re-Evaluation - 10/07/20 1113       Goals   Current Weight 177 lb (80.3 kg)    Nutrition Goal ST: continue following RD at [facility], continue with current changes. LT: continue to gain weight/muscle    Comment Marden Noble is following his diet given by the [place] and is working on gaining some muscle.    Expected Outcome Short term: continue following  diet routine from [place]. Long term: continue to gain muscle             Psychosocial: Target Goals: Acknowledge presence or absence of significant depression and/or stress, maximize coping skills, provide positive support system. Participant is able to verbalize types and ability to use techniques and skills needed for reducing stress and depression.   Education: Stress, Anxiety, and Depression - Group verbal and visual presentation to define topics covered.  Reviews how body is impacted by stress, anxiety, and depression.  Also discusses healthy ways to reduce stress and to treat/manage anxiety and depression.  Written material given at graduation. Flowsheet Row Cardiac Rehab from 10/12/2020 in [facility] Cardiac and Pulmonary Rehab  Date 09/14/20  Educator [facility]  Instruction Review Code 1- [employer] Understanding       Education: Sleep Hygiene -Provides group verbal and written instruction about how sleep can affect your health.  Define sleep hygiene, discuss sleep cycles and impact of sleep habits. Review good sleep hygiene tips.    Initial Review & Psychosocial Screening:  Initial Psych Review & Screening - 08/22/20 1420       Initial Review   Current issues with Current Stress Concerns;History of Depression;Current Depression;Current Anxiety/Panic    Source of Stress Concerns Chronic Illness;Unable to perform yard/household activities;Unable to participate in former interests or hobbies      [place]? Yes      Barriers   Psychosocial barriers to participate in program There are no identifiable barriers or psychosocial needs.;The patient should benefit from training in stress management and relaxation.      Screening Interventions   Interventions Provide feedback about the scores to participant;Encouraged to exercise;To provide support and resources with identified psychosocial needs    Expected Outcomes Short Term goal: Utilizing psychosocial counselor,  staff and physician to assist with identification of specific Stressors or current issues interfering with healing process. Setting desired goal for each stressor or current issue identified.;Long Term Goal: Stressors or current issues are controlled or eliminated.;Short Term goal: Identification and review with participant of any Quality of Life or Depression concerns found by scoring the questionnaire.;Long Term goal: The participant improves quality of Life and PHQ9 Scores as seen by post scores and/or verbalization of changes             Quality of Life Scores:   Quality of Life - 09/01/20 1536       Quality of Life   Select Quality of Life  Quality of Life Scores   Health/Function Pre 12.5 %    Socioeconomic Pre 23.63 %    Psych/Spiritual Pre 21.43 %    Family Pre 28.8 %    GLOBAL Pre 19.16 %            Scores of 19 and below usually indicate a poorer quality of life in these areas.  A difference of  2-3 points is a clinically meaningful difference.  A difference of 2-3 points in the total score of the Quality of Life Index has been associated with significant improvement in overall quality of life, self-image, physical symptoms, and general health in studies assessing change in quality of life.  PHQ-9: Recent Review Flowsheet Data     Depression screen [facility] 2/9 10/07/2020 09/12/2020 08/24/2015   Decreased Interest _0 Down, Depressed, Hopeless _1 PHQ - 2 Score _2 Altered sleeping _3 Tired, decreased energy _4 Change in appetite _5 Feeling bad or failure about yourself  0 0 0   Trouble concentrating _6 Moving slowly or fidgety/restless _7 Suicidal thoughts 0 0 0   PHQ-9 Score _8 Difficult doing work/chores Somewhat difficult Somewhat difficult -      Interpretation of Total Score  Total Score Depression Severity:  1-4 = Minimal depression, 5-9 = Mild depression, 10-14 = Moderate depression, 15-19 = Moderately severe  depression, 20-27 = Severe depression   Psychosocial Evaluation and Intervention:  Psychosocial Evaluation - 08/22/20 1444       Psychosocial Evaluation & Interventions   Interventions Encouraged to exercise with the program and follow exercise prescription;Stress management education;Relaxation education    Comments Marden Noble had a CABG in October of 2021 after being hospitalized for Sepsis related to a tooth infection. His wife states his cognitive abilities have become impaired since the sepsis so she is his main caregiver. She planned her cardiac rehab around him, so they will be coming together. 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 During PT he was told his strength is there, he needs to work on his stamina so he is excited to come to cardiac rehab to work on this. He has had a rough year and is ready to start feeling more like himself and start doing the things/hobbies he misses.    Expected Outcomes Short: attend cardiac rehab for education and exercise. Long: develop positive self care habits.    Continue Psychosocial Services  Follow up required by staff             Psychosocial Re-Evaluation:  Psychosocial Re-Evaluation     [place] Name 10/07/20 1120             Psychosocial Re-Evaluation   Current issues with Current Stress Concerns;Current Depression;History of Depression       Comments Reviewed patient health questionnaire (PHQ-9) with patient for follow up. Previously, patients score indicated signs/symptoms of depression.  Reviewed to see if patient is improving symptom wise while in program.  Score improved and patient states that it is because he has been able to exercise.       Expected Outcomes Short: Continue to attend [facility] regularly for regular exercise and social engagement. Long: Continue to improve symptoms and manage a positive mental state.  Interventions Encouraged to attend  Cardiac Rehabilitation for the exercise       Continue Psychosocial Services  Follow up required by staff                Psychosocial Discharge (Final Psychosocial Re-Evaluation):  Psychosocial Re-Evaluation - 10/07/20 1120       Psychosocial Re-Evaluation   Current issues with Current Stress Concerns;Current Depression;History of Depression    Comments Reviewed patient health questionnaire (PHQ-9) with patient for follow up. Previously, patients score indicated signs/symptoms of depression.  Reviewed to see if patient is improving symptom wise while in program.  Score improved and patient states that it is because he has been able to exercise.    Expected Outcomes Short: Continue to attend [facility] regularly for regular exercise and social engagement. Long: Continue to improve symptoms and manage a positive mental state.    Interventions Encouraged to attend Cardiac Rehabilitation for the exercise    Continue Psychosocial Services  Follow up required by staff             Vocational Rehabilitation: Provide vocational rehab assistance to qualifying candidates.   Vocational Rehab Evaluation & Intervention:  Vocational Rehab - 08/22/20 1420       Initial Vocational Rehab Evaluation & Intervention   Assessment shows need for Vocational Rehabilitation No             Education: Education Goals: Education classes will be provided on a variety of topics geared toward better understanding of heart health and risk factor modification. Participant will state understanding/return demonstration of topics presented as noted by education test scores.  Learning Barriers/Preferences:  Learning Barriers/Preferences - 08/22/20 1416       Learning Barriers/Preferences   Learning Barriers None   some cognitive issues   Learning Preferences Individual Instruction             General Cardiac Education Topics:  AED/CPR: - Group verbal and written instruction with the use of  models to demonstrate the basic use of the AED with the basic ABC's of resuscitation.   Anatomy and Cardiac Procedures: - Group verbal and visual presentation and models provide information about basic cardiac anatomy and function. Reviews the testing methods done to diagnose heart disease and the outcomes of the test results. Describes the treatment choices: Medical Management, Angioplasty, or Coronary Bypass Surgery for treating various heart conditions including Myocardial Infarction, Angina, Valve Disease, and Cardiac Arrhythmias.  Written material given at graduation.   Medication Safety: - Group verbal and visual instruction to review commonly prescribed medications for heart and lung disease. Reviews the medication, class of the drug, and side effects. Includes the steps to properly store meds and maintain the prescription regimen.  Written material given at graduation.   Intimacy: - Group verbal instruction through game format to discuss how heart and lung disease can affect sexual intimacy. Written material given at graduation.. Flowsheet Row Cardiac Rehab from 10/12/2020 in [facility] Cardiac and Pulmonary Rehab  Date 09/28/20  Educator [facility]  Instruction Review Code 1- Verbalizes Understanding       Know Your Numbers and Heart Failure: - Group verbal and visual instruction to discuss disease risk factors for cardiac and pulmonary disease and treatment options.  Reviews associated critical values for Overweight/Obesity, Hypertension, Cholesterol, and Diabetes.  Discusses basics of heart failure: signs/symptoms and treatments.  Introduces Heart Failure Zone chart for action plan for heart failure.  Written material given at graduation.   Infection Prevention: - Provides verbal and written  material to individual with discussion of infection control including proper hand washing and proper equipment cleaning during exercise session. Flowsheet Row Cardiac Rehab from 10/12/2020 in [facility] Cardiac and  Pulmonary Rehab  Date 09/12/20  Educator [facility]  Instruction Review Code 1- Verbalizes Understanding       Falls Prevention: - Provides verbal and written material to individual with discussion of falls prevention and safety. Flowsheet Row Cardiac Rehab from 10/12/2020 in [facility] Cardiac and Pulmonary Rehab  Date 09/12/20  Educator [facility]  Instruction Review Code 1- Verbalizes Understanding       Other: -Provides group and verbal instruction on various topics (see comments)   Knowledge Questionnaire Score:  Knowledge Questionnaire Score - 09/01/20 1536       Knowledge Questionnaire Score   Pre Score 25/26             Core Components/Risk Factors/Patient Goals at Admission:  Personal Goals and Risk Factors at Admission - 09/12/20 1254       Core Components/Risk Factors/Patient Goals on Admission    Weight Management Yes;Weight Maintenance    Intervention Weight Management: Develop a combined nutrition and exercise program designed to reach desired caloric intake, while maintaining appropriate intake of nutrient and fiber, sodium and fats, and appropriate energy expenditure required for the weight goal.;Weight Management: Provide education and appropriate resources to help participant work on and attain dietary goals.    Admit Weight 174 lb 6.4 oz (79.1 kg)    Goal Weight: Short Term 174 lb (78.9 kg)    Goal Weight: Long Term 174 lb (78.9 kg)    Expected Outcomes Short Term: Continue to assess and modify interventions until short term weight is achieved;Weight Maintenance: Understanding of the daily nutrition guidelines, which includes 25-35% calories from fat, 7% or less cal from saturated fats, less than 230m cholesterol, less than 1.5gm of sodium, & 5 or more servings of fruits and vegetables daily;Long Term: Adherence to nutrition and physical activity/exercise program aimed toward attainment of established weight goal    Hypertension Yes    Intervention Monitor prescription use  compliance.;Provide education on lifestyle modifcations including regular physical activity/exercise, weight management, moderate sodium restriction and increased consumption of fresh fruit, vegetables, and low fat dairy, alcohol moderation, and smoking cessation.    Expected Outcomes Short Term: Continued assessment and intervention until BP is < 140/972mHG in hypertensive participants. < 130/8038mG in hypertensive participants with diabetes, heart failure or chronic kidney disease.;Long Term: Maintenance of blood pressure at goal levels.             Education:Diabetes - Individual verbal and written instruction to review signs/symptoms of diabetes, desired ranges of glucose level fasting, after meals and with exercise. Acknowledge that pre and post exercise glucose checks will be done for 3 sessions at entry of program.   Core Components/Risk Factors/Patient Goals Review:   Goals and Risk Factor Review     Row Name 10/07/20 1122             Core Components/Risk Factors/Patient Goals Review   Personal Goals Review Weight Management/Obesity       Review DouMarden Noblents to gain a littlw weight but wants to gain muscle. He feels a little weak from when he was septic. He is watching his diet and doing well in rehab.       Expected Outcomes Short: gain some weight. Long: maintain muscle growth independently                Core  Components/Risk Factors/Patient Goals at Discharge (Final Review):   Goals and Risk Factor Review - 10/07/20 1122       Core Components/Risk Factors/Patient Goals Review   Personal Goals Review Weight Management/Obesity    Review Marden Noble wants to gain a littlw weight but wants to gain muscle. He feels a little weak from when he was septic. He is watching his diet and doing well in rehab.    Expected Outcomes Short: gain some weight. Long: maintain muscle growth independently             ITP Comments:  ITP Comments     Row Name 08/22/20 1443 09/12/20  1249 09/14/20 1000 09/21/20 0705 10/19/20 4496   ITP Comments Initial telephone orientation completed. Diagnosis can be found in [place] paperwork ([facility] Oct '21). EP orientation scheduled for Monday 5/23 at 10:30am. Completed 6MWT and gym orientation. Initial ITP created and sent for review to Dr. Emily Filbert, Medical Director. First full day of exercise!  Patient was oriented to gym and equipment including functions, settings, policies, and procedures.  Patient's individual exercise prescription and treatment plan were reviewed.  All starting workloads were established based on the results of the 6 minute walk test done at initial orientation visit.  The plan for exercise progression was also introduced and progression will be customized based on patient's performance and goals. 30 Day review completed. Medical Director ITP review done, changes made as directed, and signed approval by Medical Director.   New to program 30 Day review completed. Medical Director ITP review done, changes made as directed, and signed approval by Medical Director.            Comments:

## 2020-10-19 NOTE — Progress Notes (Signed)
Daily Session Note  Patient Details  Name: Dalton Roy MRN: [id number] Date of Birth: 06-21-1940 Referring Provider:   Flowsheet Row Cardiac Rehab from 09/12/2020 in [facility] Cardiac and Pulmonary Rehab  Referring Provider Tammy Sours ([place])       Encounter Date: 10/19/2020  Check In:  Session Check In - 10/19/20 1120       Check-In   Supervising physician immediately available to respond to emergencies See telemetry face sheet for immediately available ER MD    Location [facility]-Cardiac & Pulmonary Rehab    Staff Present Renita Papa, RN Sherryl Barters, MPA, Nino Glow, MS, ASCM CEP, Exercise Physiologist    Virtual Visit No    Medication changes reported     No    Fall or balance concerns reported    No    Warm-up and Cool-down Performed on first and last piece of equipment    Resistance Training Performed Yes    VAD Patient? No    PAD/SET Patient? No      Pain Assessment   Currently in Pain? No/denies                Social History   Tobacco Use  Smoking Status Former   Pack years: 0.00   Types: Cigarettes   Quit date: 04/09/1966   Years since quitting: 54.5  Smokeless Tobacco Former    Goals Met:  Independence with exercise equipment Exercise tolerated well No report of cardiac concerns or symptoms Strength training completed today  Goals Unmet:  Not Applicable  Comments: Pt able to follow exercise prescription today without complaint.  Will continue to monitor for progression.    Dr. Emily Filbert is Medical Director for [place].  Dr. Ottie Glazier is Medical Director for [facility] Pulmonary Rehabilitation.

## 2020-10-21 ENCOUNTER — Encounter: Payer: No Typology Code available for payment source | Admitting: *Deleted

## 2020-10-21 ENCOUNTER — Other Ambulatory Visit: Payer: Self-pay

## 2020-10-21 DIAGNOSIS — Z951 Presence of aortocoronary bypass graft: Secondary | ICD-10-CM

## 2020-10-21 DIAGNOSIS — Z48812 Encounter for surgical aftercare following surgery on the circulatory system: Secondary | ICD-10-CM | POA: Diagnosis not present

## 2020-10-21 NOTE — Progress Notes (Signed)
Daily Session Note  Patient Details  Name: Dalton Roy MRN: [id number] Date of Birth: 11/27/1940 Referring Provider:   Flowsheet Row Cardiac Rehab from 09/12/2020 in [facility] Cardiac and Pulmonary Rehab  Referring Provider Tammy Sours ([place])       Encounter Date: 10/21/2020  Check In:  Session Check In - 10/21/20 1129       Check-In   Supervising physician immediately available to respond to emergencies See telemetry face sheet for immediately available ER MD    Location [facility]-Cardiac & Pulmonary Rehab    Staff Present Renita Papa, RN BSN;Joseph Tessie Fass, RCP,RRT,BSRT;Melissa [place], [place], LDN    Virtual Visit No    Medication changes reported     No    Fall or balance concerns reported    No    Warm-up and Cool-down Performed on first and last piece of equipment    Resistance Training Performed Yes    VAD Patient? No    PAD/SET Patient? No      Pain Assessment   Currently in Pain? No/denies                Social History   Tobacco Use  Smoking Status Former   Types: Cigarettes   Quit date: 04/09/1966   Years since quitting: 54.5  Smokeless Tobacco Former    Goals Met:  Independence with exercise equipment Exercise tolerated well No report of cardiac concerns or symptoms Strength training completed today  Goals Unmet:  Not Applicable  Comments: Pt able to follow exercise prescription today without complaint.  Will continue to monitor for progression.    Dr. Emily Filbert is Medical Director for [place].  Dr. Ottie Glazier is Medical Director for [facility] Pulmonary Rehabilitation.

## 2020-10-24 ENCOUNTER — Encounter: Payer: No Typology Code available for payment source | Admitting: *Deleted

## 2020-10-24 ENCOUNTER — Other Ambulatory Visit: Payer: Self-pay

## 2020-10-24 DIAGNOSIS — Z48812 Encounter for surgical aftercare following surgery on the circulatory system: Secondary | ICD-10-CM | POA: Diagnosis not present

## 2020-10-24 DIAGNOSIS — Z951 Presence of aortocoronary bypass graft: Secondary | ICD-10-CM

## 2020-10-24 NOTE — Progress Notes (Signed)
Daily Session Note  Patient Details  Name: Dalton Roy MRN: [id number] Date of Birth: 06-16-1940 Referring Provider:   Flowsheet Row Cardiac Rehab from 09/12/2020 in [facility] Cardiac and Pulmonary Rehab  Referring Provider Tammy Sours ([place])       Encounter Date: 10/24/2020  Check In:  Session Check In - 10/24/20 1141       Check-In   Supervising physician immediately available to respond to emergencies See telemetry face sheet for immediately available ER MD    Location [facility]-Cardiac & Pulmonary Rehab    Staff Present Heath Lark, RN, BSN, CCRP;Kelly [place], MPA, RN;Joseph [place], RCP,RRT,BSRT;Kelly [place], BS, ACSM CEP, Exercise Physiologist    Virtual Visit No    Medication changes reported     No    Fall or balance concerns reported    No    Warm-up and Cool-down Performed on first and last piece of equipment    Resistance Training Performed Yes    VAD Patient? No    PAD/SET Patient? No      Pain Assessment   Currently in Pain? No/denies                Social History   Tobacco Use  Smoking Status Former   Types: Cigarettes   Quit date: 04/09/1966   Years since quitting: 54.5  Smokeless Tobacco Former    Goals Met:  Independence with exercise equipment Exercise tolerated well No report of cardiac concerns or symptoms  Goals Unmet:  Not Applicable  Comments: Pt able to follow exercise prescription today without complaint.  Will continue to monitor for progression.    Dr. Emily Filbert is Medical Director for [place].  Dr. Ottie Glazier is Medical Director for [facility] Pulmonary Rehabilitation.

## 2020-10-26 ENCOUNTER — Other Ambulatory Visit: Payer: Self-pay

## 2020-10-26 DIAGNOSIS — Z48812 Encounter for surgical aftercare following surgery on the circulatory system: Secondary | ICD-10-CM | POA: Diagnosis not present

## 2020-10-26 DIAGNOSIS — Z951 Presence of aortocoronary bypass graft: Secondary | ICD-10-CM

## 2020-10-26 NOTE — Progress Notes (Signed)
Daily Session Note  Patient Details  Name: ATLEE KLUTH MRN: [id number] Date of Birth: 05-12-1940 Referring Provider:   Flowsheet Row Cardiac Rehab from 09/12/2020 in [facility] Cardiac and Pulmonary Rehab  Referring Provider Tammy Sours ([place])       Encounter Date: 10/26/2020  Check In:  Session Check In - 10/26/20 1031       Check-In   Supervising physician immediately available to respond to emergencies See telemetry face sheet for immediately available ER MD    Location [facility]-Cardiac & Pulmonary Rehab    Staff Present Birdie Sons, MPA, Elveria Rising, BA, ACSM CEP, Exercise Physiologist;Joseph [place], RCP,RRT,BSRT;Melissa [place], RDN, LDN    Virtual Visit No    Medication changes reported     No    Fall or balance concerns reported    No    Warm-up and Cool-down Performed on first and last piece of equipment    Resistance Training Performed Yes    VAD Patient? No    PAD/SET Patient? No      Pain Assessment   Currently in Pain? No/denies                Social History   Tobacco Use  Smoking Status Former   Types: Cigarettes   Quit date: 04/09/1966   Years since quitting: 54.5  Smokeless Tobacco Former    Goals Met:  Independence with exercise equipment Exercise tolerated well No report of cardiac concerns or symptoms Strength training completed today  Goals Unmet:  Not Applicable  Comments: Pt able to follow exercise prescription today without complaint.  Will continue to monitor for progression.    Dr. Emily Filbert is Medical Director for [place].  Dr. Ottie Glazier is Medical Director for [facility] Pulmonary Rehabilitation.

## 2020-10-28 ENCOUNTER — Encounter: Payer: No Typology Code available for payment source | Admitting: *Deleted

## 2020-10-28 ENCOUNTER — Other Ambulatory Visit: Payer: Self-pay

## 2020-10-28 DIAGNOSIS — Z951 Presence of aortocoronary bypass graft: Secondary | ICD-10-CM

## 2020-10-28 DIAGNOSIS — Z48812 Encounter for surgical aftercare following surgery on the circulatory system: Secondary | ICD-10-CM | POA: Diagnosis not present

## 2020-10-28 NOTE — Progress Notes (Signed)
Daily Session Note  Patient Details  Name: Dalton Roy MRN: [id number] Date of Birth: May 21, 1940 Referring Provider:   Flowsheet Row Cardiac Rehab from 09/12/2020 in [facility] Cardiac and Pulmonary Rehab  Referring Provider Tammy Sours ([place])       Encounter Date: 10/28/2020  Check In:  Session Check In - 10/28/20 1121       Check-In   Supervising physician immediately available to respond to emergencies See telemetry face sheet for immediately available ER MD    Location [facility]-Cardiac & Pulmonary Rehab    Staff Present Renita Papa, RN BSN;Joseph [place], RCP,RRT,BSRT;Jessica [place], [place], RCEP, CCRP, CCET    Virtual Visit No    Medication changes reported     No    Fall or balance concerns reported    No    Warm-up and Cool-down Performed on first and last piece of equipment    Resistance Training Performed Yes    VAD Patient? No    PAD/SET Patient? No      Pain Assessment   Currently in Pain? No/denies                Social History   Tobacco Use  Smoking Status Former   Types: Cigarettes   Quit date: 04/09/1966   Years since quitting: 54.5  Smokeless Tobacco Former    Goals Met:  Independence with exercise equipment Exercise tolerated well No report of cardiac concerns or symptoms Strength training completed today  Goals Unmet:  Not Applicable  Comments: Pt able to follow exercise prescription today without complaint.  Will continue to monitor for progression.    Dr. Emily Filbert is Medical Director for [place].  Dr. Ottie Glazier is Medical Director for [facility] Pulmonary Rehabilitation.

## 2020-10-31 ENCOUNTER — Encounter: Payer: No Typology Code available for payment source | Admitting: *Deleted

## 2020-10-31 ENCOUNTER — Other Ambulatory Visit: Payer: Self-pay

## 2020-10-31 DIAGNOSIS — Z48812 Encounter for surgical aftercare following surgery on the circulatory system: Secondary | ICD-10-CM | POA: Diagnosis not present

## 2020-10-31 DIAGNOSIS — Z951 Presence of aortocoronary bypass graft: Secondary | ICD-10-CM

## 2020-10-31 NOTE — Progress Notes (Signed)
Daily Session Note  Patient Details  Name: Dalton Roy MRN: [id number] Date of Birth: May 10, 1940 Referring Provider:   Flowsheet Row Cardiac Rehab from 09/12/2020 in [facility] Cardiac and Pulmonary Rehab  Referring Provider Tammy Sours ([place])       Encounter Date: 10/31/2020  Check In:  Session Check In - 10/31/20 1111       Check-In   Supervising physician immediately available to respond to emergencies See telemetry face sheet for immediately available ER MD    Location [facility]-Cardiac & Pulmonary Rehab    Staff Present Renita Papa, RN BSN;Joseph Tessie Fass, RCP,RRT,BSRT;Kelly [place], MPA, [place], BS, ACSM CEP, Exercise Physiologist    Virtual Visit No    Medication changes reported     No    Fall or balance concerns reported    No    Warm-up and Cool-down Performed on first and last piece of equipment    Resistance Training Performed Yes    VAD Patient? No    PAD/SET Patient? No      Pain Assessment   Currently in Pain? No/denies                Social History   Tobacco Use  Smoking Status Former   Types: Cigarettes   Quit date: 04/09/1966   Years since quitting: 76.6  Smokeless Tobacco Former    Goals Met:  Independence with exercise equipment Exercise tolerated well No report of cardiac concerns or symptoms Strength training completed today  Goals Unmet:  Not Applicable  Comments: Pt able to follow exercise prescription today without complaint.  Will continue to monitor for progression.    Dr. Emily Filbert is Medical Director for [place].  Dr. Ottie Glazier is Medical Director for [facility] Pulmonary Rehabilitation.

## 2020-11-02 ENCOUNTER — Other Ambulatory Visit: Payer: Self-pay

## 2020-11-02 DIAGNOSIS — Z951 Presence of aortocoronary bypass graft: Secondary | ICD-10-CM

## 2020-11-02 DIAGNOSIS — Z48812 Encounter for surgical aftercare following surgery on the circulatory system: Secondary | ICD-10-CM | POA: Diagnosis not present

## 2020-11-02 NOTE — Progress Notes (Signed)
Daily Session Note  Patient Details  Name: Dalton Roy MRN: [id number] Date of Birth: Jan 19, 1941 Referring Provider:   Flowsheet Row Cardiac Rehab from 09/12/2020 in [facility] Cardiac and Pulmonary Rehab  Referring Provider Tammy Sours ([place])       Encounter Date: 11/02/2020  Check In:  Session Check In - 11/02/20 1047       Check-In   Supervising physician immediately available to respond to emergencies See telemetry face sheet for immediately available ER MD    Location [facility]-Cardiac & Pulmonary Rehab    Staff Present Birdie Sons, MPA, Elveria Rising, BA, ACSM CEP, Exercise Physiologist;Melissa [place], RDN, LDN;Joseph [place], RCP,RRT,BSRT    Virtual Visit No    Medication changes reported     No    Fall or balance concerns reported    No    Warm-up and Cool-down Performed on first and last piece of equipment    Resistance Training Performed Yes    VAD Patient? No    PAD/SET Patient? No      Pain Assessment   Currently in Pain? No/denies                Social History   Tobacco Use  Smoking Status Former   Types: Cigarettes   Quit date: 04/09/1966   Years since quitting: 41.6  Smokeless Tobacco Former    Goals Met:  Independence with exercise equipment Exercise tolerated well No report of cardiac concerns or symptoms Strength training completed today  Goals Unmet:  Not Applicable  Comments: Pt able to follow exercise prescription today without complaint.  Will continue to monitor for progression.    Dr. Emily Filbert is Medical Director for [place].  Dr. Ottie Glazier is Medical Director for [facility] Pulmonary Rehabilitation.

## 2020-11-04 ENCOUNTER — Other Ambulatory Visit: Payer: Self-pay

## 2020-11-04 ENCOUNTER — Encounter: Payer: No Typology Code available for payment source | Admitting: *Deleted

## 2020-11-04 DIAGNOSIS — Z951 Presence of aortocoronary bypass graft: Secondary | ICD-10-CM

## 2020-11-04 DIAGNOSIS — Z48812 Encounter for surgical aftercare following surgery on the circulatory system: Secondary | ICD-10-CM | POA: Diagnosis not present

## 2020-11-04 NOTE — Progress Notes (Signed)
Daily Session Note  Patient Details  Name: Dalton Roy MRN: [id number] Date of Birth: [birth year] Referring Provider:   Flowsheet Row Cardiac Rehab from 09/12/2020 in [facility] Cardiac and Pulmonary Rehab  Referring Provider Tammy Sours ([place])       Encounter Date: 11/04/2020  Check In:  Session Check In - 11/04/20 1119       Check-In   Supervising physician immediately available to respond to emergencies See telemetry face sheet for immediately available ER MD    Location [facility]-Cardiac & Pulmonary Rehab    Staff Present Renita Papa, RN BSN;Joseph Tessie Fass, RCP,RRT,BSRT;Amanda [place], [place], ACSM CEP, Exercise Physiologist    Virtual Visit No    Medication changes reported     No    Fall or balance concerns reported    No    Warm-up and Cool-down Performed on first and last piece of equipment    Resistance Training Performed Yes    VAD Patient? No    PAD/SET Patient? No      Pain Assessment   Currently in Pain? No/denies                Social History   Tobacco Use  Smoking Status Former   Types: Cigarettes   Quit date: 04/09/1966   Years since quitting: 9.6  Smokeless Tobacco Former    Goals Met:  Independence with exercise equipment Exercise tolerated well No report of cardiac concerns or symptoms Strength training completed today  Goals Unmet:  Not Applicable  Comments: Pt able to follow exercise prescription today without complaint.  Will continue to monitor for progression.    Dr. Emily Filbert is Medical Director for [facility].  Dr. Ottie Glazier is Medical Director for [facility] Pulmonary Rehabilitation.

## 2020-11-07 ENCOUNTER — Encounter: Payer: No Typology Code available for payment source | Attending: Family Medicine | Admitting: *Deleted

## 2020-11-07 ENCOUNTER — Other Ambulatory Visit: Payer: Self-pay

## 2020-11-07 DIAGNOSIS — Z48812 Encounter for surgical aftercare following surgery on the circulatory system: Secondary | ICD-10-CM | POA: Diagnosis present

## 2020-11-07 DIAGNOSIS — Z951 Presence of aortocoronary bypass graft: Secondary | ICD-10-CM | POA: Insufficient documentation

## 2020-11-07 NOTE — Progress Notes (Signed)
Daily Session Note  Patient Details  Name: Dalton Roy MRN: [id number] Date of Birth: 09-17-1940 Referring Provider:   Flowsheet Row Cardiac Rehab from 09/12/2020 in [facility] Cardiac and Pulmonary Rehab  Referring Provider Tammy Sours ([place])       Encounter Date: 11/07/2020  Check In:  Session Check In - 11/07/20 1111       Check-In   Supervising physician immediately available to respond to emergencies See telemetry face sheet for immediately available ER MD    Location [facility]-Cardiac & Pulmonary Rehab    Staff Present Renita Papa, RN Moises Blood, BS, ACSM CEP, Exercise Physiologist;Joseph Tessie Fass, Fabio Neighbors, MPA, RN    Virtual Visit No    Medication changes reported     No    Fall or balance concerns reported    No    Warm-up and Cool-down Performed on first and last piece of equipment    Resistance Training Performed Yes    VAD Patient? No    PAD/SET Patient? No      Pain Assessment   Currently in Pain? No/denies                Social History   Tobacco Use  Smoking Status Former   Types: Cigarettes   Quit date: 04/09/1966   Years since quitting: 63.6  Smokeless Tobacco Former    Goals Met:  Independence with exercise equipment Exercise tolerated well No report of cardiac concerns or symptoms Strength training completed today  Goals Unmet:  Not Applicable  Comments: Pt able to follow exercise prescription today without complaint.  Will continue to monitor for progression.    Dr. Emily Filbert is Medical Director for [place].  Dr. Ottie Glazier is Medical Director for [facility] Pulmonary Rehabilitation.

## 2020-11-09 ENCOUNTER — Other Ambulatory Visit: Payer: Self-pay

## 2020-11-09 DIAGNOSIS — Z48812 Encounter for surgical aftercare following surgery on the circulatory system: Secondary | ICD-10-CM | POA: Diagnosis not present

## 2020-11-09 DIAGNOSIS — Z951 Presence of aortocoronary bypass graft: Secondary | ICD-10-CM

## 2020-11-09 NOTE — Progress Notes (Signed)
Daily Session Note  Patient Details  Name: Dalton Roy MRN: [id number] Date of Birth: [birth year] Referring Provider:   Flowsheet Row Cardiac Rehab from 09/12/2020 in [facility] Cardiac and Pulmonary Rehab  Referring Provider Tammy Sours ([place])       Encounter Date: 11/09/2020  Check In:  Session Check In - 11/09/20 1140       Check-In   Supervising physician immediately available to respond to emergencies See telemetry face sheet for immediately available ER MD    Location [facility]-Cardiac & Pulmonary Rehab    Staff Present Alberteen Sam, MA, RCEP, CCRP, CCET;Melissa Greenfield, RDN, LDN;Brittyn Salaz, RN,BC,MSN    Virtual Visit No    Medication changes reported     No    Fall or balance concerns reported    No    Warm-up and Cool-down Performed on first and last piece of equipment    Resistance Training Performed Yes    VAD Patient? No    PAD/SET Patient? No      Pain Assessment   Currently in Pain? No/denies    Multiple Pain Sites No                Social History   Tobacco Use  Smoking Status Former   Types: Cigarettes   Quit date: 04/09/1966   Years since quitting: 36.6  Smokeless Tobacco Former    Goals Met:  Independence with exercise equipment Exercise tolerated well No report of cardiac concerns or symptoms  Goals Unmet:  Not Applicable  Comments: Pt able to follow exercise prescription today without complaint.  Will continue to monitor for progression.    Dr. Emily Filbert is Medical Director for [place].  Dr. Ottie Glazier is Medical Director for [facility] Pulmonary Rehabilitation.

## 2020-11-11 ENCOUNTER — Encounter: Payer: No Typology Code available for payment source | Admitting: *Deleted

## 2020-11-11 ENCOUNTER — Other Ambulatory Visit: Payer: Self-pay

## 2020-11-11 VITALS — Ht 71.9 in | Wt 177.6 lb

## 2020-11-11 DIAGNOSIS — Z951 Presence of aortocoronary bypass graft: Secondary | ICD-10-CM

## 2020-11-11 DIAGNOSIS — Z48812 Encounter for surgical aftercare following surgery on the circulatory system: Secondary | ICD-10-CM | POA: Diagnosis not present

## 2020-11-11 NOTE — Progress Notes (Signed)
Daily Session Note  Patient Details  Name: LOYCE KLASEN MRN: [id number] Date of Birth: 1940/04/30 Referring Provider:   Flowsheet Row Cardiac Rehab from 09/12/2020 in [facility] Cardiac and Pulmonary Rehab  Referring Provider Tammy Sours ([place])       Encounter Date: 11/11/2020  Check In:  Session Check In - 11/11/20 1105       Check-In   Supervising physician immediately available to respond to emergencies See telemetry face sheet for immediately available ER MD    Location [facility]-Cardiac & Pulmonary Rehab    Staff Present Renita Papa, RN BSN;Jessica [place], MA, RCEP, CCRP, CCET;Joseph [place], [place]    Virtual Visit No    Medication changes reported     No    Fall or balance concerns reported    No    Warm-up and Cool-down Performed on first and last piece of equipment    Resistance Training Performed Yes    VAD Patient? No    PAD/SET Patient? No      Pain Assessment   Currently in Pain? No/denies                Social History   Tobacco Use  Smoking Status Former   Types: Cigarettes   Quit date: 04/09/1966   Years since quitting: 28.6  Smokeless Tobacco Former    Goals Met:  Independence with exercise equipment Exercise tolerated well No report of cardiac concerns or symptoms Strength training completed today  Goals Unmet:  Not Applicable  Comments: Pt able to follow exercise prescription today without complaint.  Will continue to monitor for progression.   [place] Name 09/12/20 1249 11/11/20 1125       6 Minute Walk   Phase Initial Discharge    Distance 990 feet 1250 feet    Distance % Change -- 26.3 %    Distance Feet Change -- 260 ft    Walk Time 6 minutes 6 minutes    # of Rest Breaks 0 0    MPH 1.88 2.37    METS 1.97 2.57    RPE 13 13    Perceived Dyspnea  1 --    VO2 Peak 6.91 9.05    Symptoms Yes (comment) No    Comments SOB --    Resting HR 75 bpm 72 bpm    Resting BP 110/62 124/72    Resting Oxygen Saturation  98  % 97 %    Exercise Oxygen Saturation  during 6 min walk 97 % 97 %    Max Ex. HR 79 bpm 89 bpm    Max Ex. BP 128/70 142/72    2 Minute Post BP 106/56 --               Dr. Emily Filbert is Medical Director for [place].  Dr. Ottie Glazier is Medical Director for [facility] Pulmonary Rehabilitation.

## 2020-11-14 ENCOUNTER — Other Ambulatory Visit: Payer: Self-pay

## 2020-11-14 ENCOUNTER — Encounter: Payer: No Typology Code available for payment source | Admitting: *Deleted

## 2020-11-14 DIAGNOSIS — Z48812 Encounter for surgical aftercare following surgery on the circulatory system: Secondary | ICD-10-CM | POA: Diagnosis not present

## 2020-11-14 DIAGNOSIS — Z951 Presence of aortocoronary bypass graft: Secondary | ICD-10-CM

## 2020-11-14 NOTE — Progress Notes (Signed)
Daily Session Note  Patient Details  Name: Dalton Roy MRN: [id number] Date of Birth: [birth year] Referring Provider:   Flowsheet Row Cardiac Rehab from 09/12/2020 in [facility] Cardiac and Pulmonary Rehab  Referring Provider Tammy Sours ([place])       Encounter Date: 11/14/2020  Check In:  Session Check In - 11/14/20 1156       Check-In   Supervising physician immediately available to respond to emergencies See telemetry face sheet for immediately available ER MD    Location [facility]-Cardiac & Pulmonary Rehab    Staff Present Heath Lark, RN, BSN, CCRP;Kelly [place], MPA, RN;Amanda Sommer, BA, ACSM CEP, Exercise Physiologist;Kara Eliezer Bottom, MS, ASCM CEP, Exercise Physiologist    Virtual Visit No    Medication changes reported     No    Fall or balance concerns reported    No    Warm-up and Cool-down Performed on first and last piece of equipment    Resistance Training Performed Yes    VAD Patient? No    PAD/SET Patient? No      Pain Assessment   Currently in Pain? No/denies                Social History   Tobacco Use  Smoking Status Former   Types: Cigarettes   Quit date: 04/09/1966   Years since quitting: 56.6  Smokeless Tobacco Former    Goals Met:  Independence with exercise equipment Exercise tolerated well No report of cardiac concerns or symptoms  Goals Unmet:  Not Applicable  Comments: Pt able to follow exercise prescription today without complaint.  Will continue to monitor for progression.    Dr. Emily Filbert is Medical Director for [place].  Dr. Ottie Glazier is Medical Director for [facility] Pulmonary Rehabilitation.

## 2020-11-16 ENCOUNTER — Encounter: Payer: Self-pay | Admitting: *Deleted

## 2020-11-16 ENCOUNTER — Other Ambulatory Visit: Payer: Self-pay

## 2020-11-16 DIAGNOSIS — Z951 Presence of aortocoronary bypass graft: Secondary | ICD-10-CM

## 2020-11-16 DIAGNOSIS — Z48812 Encounter for surgical aftercare following surgery on the circulatory system: Secondary | ICD-10-CM | POA: Diagnosis not present

## 2020-11-16 NOTE — Progress Notes (Signed)
Cardiac Individual Treatment Plan  Patient Details  Name: Dalton Roy MRN: [id number] Date of Birth: 10-20-1940 Referring Provider:   Flowsheet Row Cardiac Rehab from 09/12/2020 in [facility] Cardiac and Pulmonary Rehab  Referring Provider Tammy Sours ([place])       Initial Encounter Date:  Flowsheet Row Cardiac Rehab from 09/12/2020 in [facility] Cardiac and Pulmonary Rehab  Date 09/12/20       Visit Diagnosis: S/P CABG x 3  Patient's Home Medications on Admission:  Current Outpatient Medications:    acetaminophen (TYLENOL) 325 MG tablet, Take 975 mg by mouth every 8 (eight) hours as needed., Disp: , Rfl:    allopurinol (ZYLOPRIM) 300 MG tablet, TAKE 1 TABLET(300 MG) BY MOUTH DAILY (Patient taking differently: Take 100 mg by mouth 3 (three) times a week. Every Monday, Wednesday and Friday.), Disp: 90 tablet, Rfl: 1   apixaban (ELIQUIS) 5 MG TABS tablet, Take 5 mg by mouth every 12 (twelve) hours., Disp: , Rfl:    aspirin EC 81 MG tablet, Take 81 mg by mouth daily with lunch., Disp: , Rfl:    atorvastatin (LIPITOR) 40 MG tablet, Take 20 mg by mouth See admin instructions. Take 20 mg at bedtime for 20 days, then take 40 mg at bedtime., Disp: , Rfl:    bisacodyl (DULCOLAX) 10 MG suppository, Place 10 mg rectally daily as needed for moderate constipation., Disp: , Rfl:    capsaicin (ZOSTRIX) 0.025 % cream, Apply 1 application topically every 12 (twelve) hours as needed (pain below right scapula)., Disp: , Rfl:    doxycycline (VIBRA-TABS) 100 MG tablet, Take 1 tablet (100 mg total) by mouth 2 (two) times daily., Disp: 20 tablet, Rfl: 0   ferrous sulfate 325 (65 FE) MG tablet, Take 325 mg by mouth at bedtime., Disp: , Rfl:    gabapentin (NEURONTIN) 300 MG capsule, Take 300 mg by mouth at bedtime., Disp: , Rfl:    hydrocortisone 2.5 % cream, Apply 1 application topically 3 (three) times daily as needed (hemorrhoidal itching)., Disp: , Rfl:    levothyroxine (SYNTHROID, LEVOTHROID) 75 MCG tablet, Take 1  tablet (75 mcg total) by mouth daily before breakfast. (Patient taking differently: Take 75 mcg by mouth daily.), Disp: 90 tablet, Rfl: 3   melatonin 5 MG TABS, Take 5 mg by mouth at bedtime as needed (sleep)., Disp: , Rfl:    metoprolol tartrate (LOPRESSOR) 25 MG tablet, Take 12.5 mg by mouth every 12 (twelve) hours., Disp: , Rfl:    mirtazapine (REMERON) 30 MG tablet, Take 30 mg by mouth at bedtime., Disp: , Rfl:    sodium bicarbonate 650 MG tablet, Take 650 mg by mouth 4 (four) times daily., Disp: , Rfl:    white petrolatum ointment, Apply 1 application topically in the morning and at bedtime., Disp: , Rfl:   Past Medical History: Past Medical History:  Diagnosis Date   Bipolar 1 disorder ([place])    Cancer ([place]) 20 years   skin   Gout    Heart murmur    Hypertension    Hypothyroidism    ITP (idiopathic thrombocytopenic purpura)    Migraines    Psoriasis     Tobacco Use: Social History   Tobacco Use  Smoking Status Former   Types: Cigarettes   Quit date: 04/09/1966   Years since quitting: 31.6  Smokeless Tobacco Former    Labs: Recent Government social research officer for [employer] Cardiac and Pulmonary Rehab Latest Ref Rng & Units 08/11/2015  Cholestrol 0 - 200 mg/dL 179   LDLCALC 0 - 99 mg/dL 107(H)   HDL >39.00 mg/dL 34.40(L)   Trlycerides 0.0 - 149.0 mg/dL 189.0(H)        Exercise Target Goals: Exercise Program Goal: Individual exercise prescription set using results from initial 6 min walk test and THRR while considering  patient's activity barriers and safety.   Exercise Prescription Goal: Initial exercise prescription builds to 30-45 minutes a day of aerobic activity, 2-3 days per week.  Home exercise guidelines will be given to patient during program as part of exercise prescription that the participant will acknowledge.   Education: Aerobic Exercise: - Group verbal and visual presentation on the components of exercise prescription. Introduces F.I.T.T principle from  ACSM for exercise prescriptions.  Reviews F.I.T.T. principles of aerobic exercise including progression. Written material given at graduation. Flowsheet Row Cardiac Rehab from 11/09/2020 in [facility] Cardiac and Pulmonary Rehab  Education need identified 09/12/20  Date 09/28/20  Educator [facility]  Instruction Review Code 1- Verbalizes Understanding       Education: Resistance Exercise: - Group verbal and visual presentation on the components of exercise prescription. Introduces F.I.T.T principle from ACSM for exercise prescriptions  Reviews F.I.T.T. principles of resistance exercise including progression. Written material given at graduation.    Education: Exercise & Equipment Safety: - Individual verbal instruction and demonstration of equipment use and safety with use of the equipment. Flowsheet Row Cardiac Rehab from 11/09/2020 in [facility] Cardiac and Pulmonary Rehab  Date 09/12/20  Educator [facility]  Instruction Review Code 1- Verbalizes Understanding       Education: Exercise Physiology & General Exercise Guidelines: - Group verbal and written instruction with models to review the exercise physiology of the cardiovascular system and associated critical values. Provides general exercise guidelines with specific guidelines to those with heart or lung disease.  Flowsheet Row Cardiac Rehab from 11/09/2020 in [facility] Cardiac and Pulmonary Rehab  Date 09/21/20  Educator [facility]  Instruction Review Code 1- Verbalizes Understanding       Education: Flexibility, Balance, Mind/Body Relaxation: - Group verbal and visual presentation with interactive activity on the components of exercise prescription. Introduces F.I.T.T principle from ACSM for exercise prescriptions. Reviews F.I.T.T. principles of flexibility and balance exercise training including progression. Also discusses the mind body connection.  Reviews various relaxation techniques to help reduce and manage stress (i.e. Deep breathing, progressive muscle  relaxation, and visualization). Balance handout provided to take home. Written material given at graduation. Flowsheet Row Cardiac Rehab from 11/09/2020 in [facility] Cardiac and Pulmonary Rehab  Date 10/12/20  Educator [facility]  Instruction Review Code 1- Verbalizes Understanding       Activity Barriers & Risk Stratification:  Activity Barriers & Cardiac Risk Stratification - 08/22/20 1412       Activity Barriers & Cardiac Risk Stratification   Activity Barriers Muscular Weakness;Assistive Device;Other (comment)    Comments uses cane at times; some cognitive impairment (wife states its not noticeable to many people) post sepsis    Cardiac Risk Stratification High             6 Minute Walk:  6 Minute Walk     Row Name 09/12/20 1249 11/11/20 1125       6 Minute Walk   Phase Initial Discharge    Distance 990 feet 1250 feet    Distance % Change -- 26.3 %    Distance Feet Change -- 260 ft    Walk Time 6 minutes 6 minutes    # of  Rest Breaks 0 0    MPH 1.88 2.37    METS 1.97 2.57    RPE 13 13    Perceived Dyspnea  1 --    VO2 Peak 6.91 9.05    Symptoms Yes (comment) No    Comments SOB --    Resting HR 75 bpm 72 bpm    Resting BP 110/62 124/72    Resting Oxygen Saturation  98 % 97 %    Exercise Oxygen Saturation  during 6 min walk 97 % 97 %    Max Ex. HR 79 bpm 89 bpm    Max Ex. BP 128/70 142/72    2 Minute Post BP 106/56 --             Oxygen Initial Assessment:   Oxygen Re-Evaluation:   Oxygen Discharge (Final Oxygen Re-Evaluation):   Initial Exercise Prescription:  Initial Exercise Prescription - 09/12/20 1200       Date of Initial Exercise RX and Referring Provider   Date 09/12/20    Ref[place] ([place])      Recumbant Bike   Level 1    RPM 50    Watts 5    Minutes 15    METs 1.5      NuStep   Level 1    SPM 80    Minutes 15    METs 1.5      Track   Laps 25    Minutes 15    METs 2.4      Prescription Details   Frequency  (times per week) 3    Duration Progress to 30 minutes of continuous aerobic without signs/symptoms of physical distress      Intensity   THRR 40-80% of Max Heartrate 101-128    Ratings of Perceived Exertion 11-13    Perceived Dyspnea 0-4      Progression   Progression Continue to progress workloads to maintain intensity without signs/symptoms of physical distress.      Resistance Training   Training Prescription Yes    Weight 4 lb    Reps 10-15             Perform Capillary Blood Glucose checks as needed.  Exercise Prescription Changes:   Exercise Prescription Changes     Row Name 09/12/20 1200 09/21/20 1300 10/06/20 1400 10/19/20 0900 10/31/20 1100     Response to Exercise   Blood Pressure (Admit) 110/62 102/80 122/68 118/64 --   Blood Pressure (Exercise) 128/70 128/68 148/72 -- --   Blood Pressure (Exit) 106/56 124/72 138/72 128/82 --   Heart Rate (Admit) 75 bpm 94 bpm 98 bpm 68 bpm --   Heart Rate (Exercise) 79 bpm 109 bpm 111 bpm 105 bpm --   Heart Rate (Exit) 83 bpm 75 bpm 104 bpm 74 bpm --   Oxygen Saturation (Admit) 98 % -- -- -- --   Oxygen Saturation (Exercise) 97 % -- -- -- --   Rating of Perceived Exertion (Exercise) _0 --   Perceived Dyspnea (Exercise) 1 -- -- -- --   Symptoms SOB -- -- none --   Comments walk test results -- -- -- --   Duration -- Continue with 30 min of aerobic exercise without signs/symptoms of physical distress. Continue with 30 min of aerobic exercise without signs/symptoms of physical distress. Continue with 30 min of aerobic exercise without signs/symptoms of physical distress. --   Intensity -- THRR unchanged THRR unchanged THRR unchanged --  Progression   Progression -- -- Continue to progress workloads to maintain intensity without signs/symptoms of physical distress. Continue to progress workloads to maintain intensity without signs/symptoms of physical distress. --   Average METs -- -- 3.2 2.95 --     Resistance  Training   Training Prescription -- Yes Yes Yes --   Weight -- 3 lb 4 lb 4 lb --   Reps -- 10-15 10-15 10-15 --     Interval Training   Interval Training -- -- -- No --     NuStep   Level -- _0 --   SPM -- 80 80 -- --   Minutes -- _1 --   METs -- 2.5 3.2 3.2 --     Track   Laps -- _2 --   Minutes -- _3 --   METs -- 2.6 2.41 2.69 --     Home Exercise Plan   Plans to continue exercise at -- -- -- -- [employer] (comment)  [employer] and walk at home   Frequency -- -- -- -- Add 1 additional day to program exercise sessions.   Initial Home Exercises Provided -- -- -- -- 10/31/20    Row Name 11/02/20 1300 11/14/20 1400           Response to Exercise   Blood Pressure (Admit) 118/72 124/72      Blood Pressure (Exit) 126/76 116/62      Heart Rate (Admit) 78 bpm 72 bpm      Heart Rate (Exercise) 87 bpm 89 bpm      Heart Rate (Exit) 78 bpm 77 bpm      Rating of Perceived Exertion (Exercise) 15 13      Symptoms none none      Duration Continue with 30 min of aerobic exercise without signs/symptoms of physical distress. Continue with 30 min of aerobic exercise without signs/symptoms of physical distress.      Intensity THRR unchanged THRR unchanged             Progression      Progression Continue to progress workloads to maintain intensity without signs/symptoms of physical distress. Continue to progress workloads to maintain intensity without signs/symptoms of physical distress.      Average METs 3.3 3.16             Resistance Training      Training Prescription Yes Yes      Weight 4 lb 4 lb      Reps 10-15 10-15             Interval Training      Interval Training No No             NuStep      Level -- 5      Minutes -- 15      METs -- 3.8             Track      Laps -- 39      Minutes -- 15      METs -- 3.12             Home Exercise Plan      Plans to continue exercise at [employer] (comment)  [employer] and walk at home [employer] (comment)  [employer] and walk at home      Frequency Add 1 additional day to program exercise sessions. Add 1 additional day to program exercise sessions.  Initial Home Exercises Provided 10/31/20 10/31/20              Exercise Comments:   Exercise Comments     Row Name 09/14/20 1000           Exercise Comments First full day of exercise!  Patient was oriented to gym and equipment including functions, settings, policies, and procedures.  Patient's individual exercise prescription and treatment plan were reviewed.  All starting workloads were established based on the results of the 6 minute walk test done at initial orientation visit.  The plan for exercise progression was also introduced and progression will be customized based on patient's performance and goals.                Exercise Goals and Review:   Exercise Goals     Row Name 09/12/20 1251             Exercise Goals   Increase Physical Activity Yes       Intervention Provide advice, education, support and counseling about physical activity/exercise needs.;Develop an individualized exercise prescription for aerobic and resistive training based on initial evaluation findings, risk stratification, comorbidities and participant's personal goals.       Expected Outcomes Short Term: Attend rehab on a regular basis to increase amount of physical activity.;Long Term: Add in home exercise to make exercise part of routine and to increase amount of physical activity.;Long Term: Exercising regularly at least 3-5 days a week.       Increase Strength and Stamina Yes       Intervention Provide advice, education, support and counseling about physical activity/exercise needs.;Develop an individualized exercise prescription for aerobic and resistive training based on initial evaluation findings, risk stratification, comorbidities and participant's personal goals.       Expected Outcomes Short Term: Increase workloads from  initial exercise prescription for resistance, speed, and METs.;Short Term: Perform resistance training exercises routinely during rehab and add in resistance training at home;Long Term: Improve cardiorespiratory fitness, muscular endurance and strength as measured by increased METs and functional capacity (6MWT)       Able to understand and use rate of perceived exertion (RPE) scale Yes       Intervention Provide education and explanation on how to use RPE scale       Expected Outcomes Short Term: Able to use RPE daily in rehab to express subjective intensity level;Long Term:  Able to use RPE to guide intensity level when exercising independently       Able to understand and use Dyspnea scale Yes       Intervention Provide education and explanation on how to use Dyspnea scale       Expected Outcomes Short Term: Able to use Dyspnea scale daily in rehab to express subjective sense of shortness of breath during exertion;Long Term: Able to use Dyspnea scale to guide intensity level when exercising independently       Knowledge and understanding of Target Heart Rate Range (THRR) Yes       Intervention Provide education and explanation of THRR including how the numbers were predicted and where they are located for reference       Expected Outcomes Short Term: Able to state/look up THRR;Short Term: Able to use daily as guideline for intensity in rehab;Long Term: Able to use THRR to govern intensity when exercising independently       Able to check pulse independently Yes       Intervention Provide education and demonstration  on how to check pulse in carotid and radial arteries.;Review the importance of being able to check your own pulse for safety during independent exercise       Expected Outcomes Short Term: Able to explain why pulse checking is important during independent exercise;Long Term: Able to check pulse independently and accurately       Understanding of Exercise Prescription Yes        Intervention Provide education, explanation, and written materials on patient's individual exercise prescription       Expected Outcomes Short Term: Able to explain program exercise prescription;Long Term: Able to explain home exercise prescription to exercise independently                Exercise Goals Re-Evaluation :  Exercise Goals Re-Evaluation     Row Name 09/14/20 1000 09/21/20 1357 10/06/20 1419 10/19/20 0859 10/31/20 1117     Exercise Goal Re-Evaluation   Exercise Goals Review Increase Physical Activity;Able to understand and use rate of perceived exertion (RPE) scale;Knowledge and understanding of Target Heart Rate Range (THRR);Understanding of Exercise Prescription;Increase Strength and Stamina;Able to understand and use Dyspnea scale;Able to check pulse independently Increase Physical Activity;Increase Strength and Stamina Increase Physical Activity;Increase Strength and Stamina Increase Physical Activity;Increase Strength and Stamina;Understanding of Exercise Prescription Increase Physical Activity;Increase Strength and Stamina   Comments Reviewed RPE and dyspnea scales, THR and program prescription with pt today.  Pt voiced understanding and was given a copy of goals to take home. Dalton Roy is progressing well.  He moved up to level 4 on NS and did 30 laps on track!  We will continue to monitor progress. Dalton Roy is at the same level since last review.  Staff will encourage increasing level on seated equipment. Dalton Roy is doing well in rehab.  He is up to 3.8 METs on the NuStep and 31 laps on the track.  We will conitnue to monitor his progress. Reviewed home exercise with pt today.  Pt plans to walk for exercise.  Reviewed THR, pulse, RPE, sign and symptoms, pulse oximetery and when to call 911 or MD.  Also discussed weather considerations and indoor options.  Pt voiced understanding.  We reviewed doing squats holding on to kitchen sink so he doesnt lose balance   Expected Outcomes Short: Use RPE  daily to regulate intensity. Long: Follow program prescription in THR. Short: attend consistently Long:  increase stamina Short: increase load on seated equipment Long:  improve overall MET level Short: Continue to aim for more laps  Long: Continue to improve stamina Short: work on squats at home Long: build strength    [place] Name 11/14/20 1419             Exercise Goal Re-Evaluation   Exercise Goals Review Increase Physical Activity;Increase Strength and Stamina       Comments Dalton Roy is doing well. He is up to 39 laps on the track as well as level 5 on the T4 Nustep. He improved his post 6MWT by 26%!! He will be graduating soon and we will continue to monitor his progression until then.       Expected Outcomes Short: Graduate Long: Continue to exercise at home independently at appropriate prescription                Discharge Exercise Prescription (Final Exercise Prescription Changes):  Exercise Prescription Changes - 11/14/20 1400       Response to Exercise   Blood Pressure (Admit) 124/72    Blood Pressure (Exit) 116/62  Heart Rate (Admit) 72 bpm    Heart Rate (Exercise) 89 bpm    Heart Rate (Exit) 77 bpm    Rating of Perceived Exertion (Exercise) 13    Symptoms none    Duration Continue with 30 min of aerobic exercise without signs/symptoms of physical distress.    Intensity THRR unchanged      Progression   Progression Continue to progress workloads to maintain intensity without signs/symptoms of physical distress.    Average METs 3.16      Resistance Training   Training Prescription Yes    Weight 4 lb    Reps 10-15      Interval Training   Interval Training No      NuStep   Level 5    Minutes 15    METs 3.8      Track   Laps 39    Minutes 15    METs 3.12      Home Exercise Plan   Plans to continue exercise at [employer] (comment)   [employer] and walk at home   Frequency Add 1 additional day to program exercise sessions.    Initial Home Exercises  Provided 10/31/20             Nutrition:  Target Goals: Understanding of nutrition guidelines, daily intake of sodium <1527m, cholesterol <2089m calories 30% from fat and 7% or less from saturated fats, daily to have 5 or more servings of fruits and vegetables.  Education: All About Nutrition: -Group instruction provided by verbal, written material, interactive activities, discussions, models, and posters to present general guidelines for heart healthy nutrition including fat, fiber, MyPlate, the role of sodium in heart healthy nutrition, utilization of the nutrition label, and utilization of this knowledge for meal planning. Follow up email sent as well. Written material given at graduation. [facility]  Date 10/19/20  Educator [facility] Review Code 1- Verbalizes Understanding       Biometrics:  Pre Biometrics - 09/12/20 1252       Pre Biometrics   Height 5' 11.9" (1.826 m)    Weight 174 lb 6.4 oz (79.1 kg)    BMI (Calculated) 23.73    Single Leg Stand 0.5 seconds             Post Biometrics - 11/11/20 1126        Post  Biometrics   Height 5' 11.9" (1.826 m)    Weight 177 lb 9.6 oz (80.6 kg)    BMI (Calculated) 24.16             Nutrition Therapy Plan and Nutrition Goals:  Nutrition Therapy & Goals - 09/19/20 1125       Nutrition Therapy   Diet Heart healthy, low Na    Drug/Food Interactions Statins/Certain Fruits    Protein (specify units) 65g    Fiber 30 grams    Whole Grain Foods 3 servings    Saturated Fats 12 max. grams    Fruits and Vegetables 8 servings/day    Sodium 1.5 grams      Personal Nutrition Goals   Nutrition Goal ST: [facility] [place] [facility] with [facility] changes. LT: continue to gain weight/muscle    Comments 2 meals per day. He has not been sleeping well since the hospital and in the last couple of days has gotten worse. Breakfast/lunch burrito in  am (onions, red peppers, hashbrowns, veggie sausage, sour cream  and salsa) S: fruit D: bowl of homemade vegetables  Drinks: lemonade -(sweet), tea (unsweet), water. RD gave him meals, psychologist has helped him with eating (mindset change). Will get salmon at [facility] (salmon and rice and broccoli)mashed potato, pintos, mac and cheese. Will sometimes make his own tuna salad (1tbsp of each thing he will normally eat),. [place] [employer] - potato salad, pintos, and okra. will drink 1-2 ensure clear per day (usually 1x/day). He is gaining weight. He would like to continue with what he is doing.             Nutrition Assessments:  MEDIFICTS Score Key: ?70 Need to make dietary changes  40-70 Heart Healthy Diet ? 40 Therapeutic Level Cholesterol Diet  Flowsheet Row Cardiac Rehab from 09/12/2020 in [facility] Cardiac and Pulmonary Rehab  Picture Your Plate Total Score on Admission 83      Picture Your Plate Scores: <01 Unhealthy dietary pattern with much room for improvement. 41-50 Dietary pattern unlikely to meet recommendations for good health and room for improvement. 51-60 More healthful dietary pattern, with some room for improvement.  >60 Healthy dietary pattern, although there may be some specific behaviors that could be improved.    Nutrition Goals Re-Evaluation:  Nutrition Goals Re-Evaluation     [place] Name 10/07/20 1113 10/31/20 1116           Goals   Current Weight 177 lb (80.3 kg) --      Nutrition Goal ST: continue following RD at [facility], continue with current changes. LT: continue to gain weight/muscle --      Comment Dalton Roy is following his diet given by the [place] and is working on gaining some muscle. 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 Participant is able to verbalize types and ability to use techniques and skills needed for reducing stress and depression.   Education: Stress, Anxiety, and Depression - Group verbal and visual presentation to define topics covered.  Reviews how body is impacted by stress, anxiety, and depression.  Also discusses healthy ways to reduce stress and to treat/manage anxiety and depression.  Written material given at graduation. Flowsheet Row Cardiac Rehab from 11/09/2020 in [facility] Cardiac and Pulmonary Rehab  Date 09/14/20  Educator [facility]  Instruction Review Code 1- [employer] Understanding       Education: Sleep Hygiene -Provides group verbal and written instruction about how sleep can affect your health.  Define sleep hygiene, discuss sleep cycles and impact of sleep habits. Review good sleep hygiene tips.    Initial Review & Psychosocial Screening:  Initial Psych Review & Screening - 08/22/20 1420       Initial Review   Current issues with Current Stress Concerns;History of  Depression;Current Depression;Current Anxiety/Panic    Source of Stress Concerns Chronic Illness;Unable to perform yard/household activities;Unable to participate in former interests or hobbies      Reader? Yes      Barriers   Psychosocial barriers to participate in program There are no identifiable barriers or psychosocial needs.;The patient should benefit from training in stress management and relaxation.      Screening Interventions   Interventions Provide feedback  about the scores to participant;Encouraged to exercise;To provide support and resources with identified psychosocial needs    Expected Outcomes Short Term goal: Utilizing psychosocial counselor, staff and physician to assist with identification of specific Stressors or current issues interfering with healing process. Setting desired goal for each stressor or current issue identified.;Long Term Goal: Stressors or current issues are controlled or eliminated.;Short Term goal: Identification and review with participant of any Quality of Life or Depression concerns found by scoring the questionnaire.;Long Term goal: The participant improves quality of Life and PHQ9 Scores as seen by post scores and/or verbalization of changes             Quality of Life Scores:   Quality of Life - 09/01/20 1536       Quality of Life   Select Quality of Life      Quality of Life Scores   Health/Function Pre 12.5 %    Socioeconomic Pre 23.63 %    Psych/Spiritual Pre 21.43 %    Family Pre 28.8 %    GLOBAL Pre 19.16 %            Scores of 19 and below usually indicate a poorer quality of life in these areas.  A difference of  2-3 points is a clinically meaningful difference.  A difference of 2-3 points in the total score of the Quality of Life Index has been associated with significant improvement in overall quality of life, self-image, physical symptoms, and general health in studies assessing change in quality of life.  PHQ-9: Recent Review Flowsheet Data     Depression screen [facility] 2/9 10/31/2020 10/07/2020 09/12/2020 08/24/2015   Decreased Interest _0 Down, Depressed, Hopeless _1 PHQ - 2 Score _2 Altered sleeping _3 Tired, decreased energy _4 Change in appetite _5 Feeling bad or failure about yourself  0 0 0 0   Trouble concentrating _6 Moving slowly or fidgety/restless _7 Suicidal thoughts 0 0 0 0   PHQ-9 Score _8 Difficult doing  work/chores Somewhat difficult Somewhat difficult Somewhat difficult -      Interpretation of Total Score  Total Score Depression Severity:  1-4 = Minimal depression, 5-9 = Mild depression, 10-14 = Moderate depression, 15-19 = Moderately severe depression, 20-27 = Severe depression   Psychosocial Evaluation and Intervention:  Psychosocial Evaluation - 08/22/20 1444       Psychosocial Evaluation & Interventions   Interventions Encouraged to exercise with the program and follow exercise prescription;Stress management education;Relaxation education    Comments 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 His wife states his cognitive abilities have become impaired since the sepsis so she is his main caregiver. She planned her cardiac rehab around him, so they will  be coming together. 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 During PT he was told his strength is there, he needs to work on his stamina so he is excited to come to cardiac rehab to work on this. He has had a rough year and is ready to start feeling more like himself and start doing the things/hobbies he misses.    Expected Outcomes Short: attend cardiac rehab for education and exercise. Long: develop positive self care habits.    Continue Psychosocial Services  Follow up required by staff             Psychosocial Re-Evaluation:  Psychosocial Re-Evaluation     [place] Name 10/07/20 1120 10/31/20 1112           Psychosocial Re-Evaluation   Current issues with Current Stress Concerns;Current Depression;History of Depression Current Stress Concerns;Current Depression;History of Depression      Comments Reviewed patient health questionnaire (PHQ-9) with patient for follow up. Previously, patients score indicated signs/symptoms of depression.  Reviewed to see if patient is improving symptom wise while  in program.  Score improved and patient states that it is because he has been able to exercise. Reviewed PHQ-9 - no change from last time.  He has PTSD and doesnt sleep well.      Expected Outcomes Short: Continue to attend [facility] regularly for regular exercise and social engagement. Long: Continue to improve symptoms and manage a positive mental state. Short: continue meds as directed Long: continue to work on improving sleep patterns      Interventions Encouraged to attend Cardiac Rehabilitation for the exercise --      Continue Psychosocial Services  Follow up required by staff --               Psychosocial Discharge (Final Psychosocial Re-Evaluation):  Psychosocial Re-Evaluation - 10/31/20 1112       Psychosocial Re-Evaluation   Current issues with Current Stress Concerns;Current Depression;History of Depression    Comments Reviewed PHQ-9 - no change from last time.  He has PTSD and doesnt sleep well.    Expected Outcomes Short: continue meds as directed Long: continue to work on improving sleep patterns             Vocational Rehabilitation: Provide vocational rehab assistance to qualifying candidates.   Vocational Rehab Evaluation & Intervention:  Vocational Rehab - 08/22/20 1420       Initial Vocational Rehab Evaluation & Intervention   Assessment shows need for Vocational Rehabilitation No             Education: Education Goals: Education classes will be provided on a variety of topics geared toward better understanding of heart health and risk factor modification. Participant will state understanding/return demonstration of topics presented as noted by education test scores.  Learning Barriers/Preferences:  Learning Barriers/Preferences - 08/22/20 1416       Learning Barriers/Preferences   Learning Barriers None   some cognitive issues   Learning Preferences Individual Instruction             General Cardiac Education Topics:  AED/CPR: -  Group verbal and written instruction with the use of models to demonstrate the basic use of the AED with the basic ABC's of resuscitation.   Anatomy and Cardiac Procedures: - Group verbal and visual presentation and models provide information about basic cardiac anatomy and function. Reviews the testing methods done to diagnose heart disease  and the outcomes of the test results. Describes the treatment choices: Medical Management, Angioplasty, or Coronary Bypass Surgery for treating various heart conditions including Myocardial Infarction, Angina, Valve Disease, and Cardiac Arrhythmias.  Written material given at graduation.   Medication Safety: - Group verbal and visual instruction to review commonly prescribed medications for heart and lung disease. Reviews the medication, class of the drug, and side effects. Includes the steps to properly store meds and maintain the prescription regimen.  Written material given at graduation. Flowsheet Row Cardiac Rehab from 11/09/2020 in [facility] Cardiac and Pulmonary Rehab  Date 10/26/20  Educator [facility]  Instruction Review Code 1- Verbalizes Understanding       Intimacy: - Group verbal instruction through game format to discuss how heart and lung disease can affect sexual intimacy. Written material given at graduation.. Flowsheet Row Cardiac Rehab from 11/09/2020 in [facility] Cardiac and Pulmonary Rehab  Date 09/28/20  Educator [facility]  Instruction Review Code 1- Verbalizes Understanding       Know Your Numbers and Heart Failure: - Group verbal and visual instruction to discuss disease risk factors for cardiac and pulmonary disease and treatment options.  Reviews associated critical values for Overweight/Obesity, Hypertension, Cholesterol, and Diabetes.  Discusses basics of heart failure: signs/symptoms and treatments.  Introduces Heart Failure Zone chart for action plan for heart failure.  Written material given at graduation. Flowsheet Row Cardiac Rehab from 11/09/2020  in [facility] Cardiac and Pulmonary Rehab  Date 11/02/20  Educator [facility]  Instruction Review Code 1- Verbalizes Understanding       Infection Prevention: - Provides verbal and written material to individual with discussion of infection control including proper hand washing and proper equipment cleaning during exercise session. Flowsheet Row Cardiac Rehab from 11/09/2020 in [facility] Cardiac and Pulmonary Rehab  Date 09/12/20  Educator [facility]  Instruction Review Code 1- Verbalizes Understanding       Falls Prevention: - Provides verbal and written material to individual with discussion of falls prevention and safety. Flowsheet Row Cardiac Rehab from 11/09/2020 in [facility] Cardiac and Pulmonary Rehab  Date 09/12/20  Educator [facility]  Instruction Review Code 1- Verbalizes Understanding       Other: -Provides group and verbal instruction on various topics (see comments)   Knowledge Questionnaire Score:  Knowledge Questionnaire Score - 09/01/20 1536       Knowledge Questionnaire Score   Pre Score 25/26             Core Components/Risk Factors/Patient Goals at Admission:  Personal Goals and Risk Factors at Admission - 09/12/20 1254       Core Components/Risk Factors/Patient Goals on Admission    Weight Management Yes;Weight Maintenance    Intervention Weight Management: Develop a combined nutrition and exercise program designed to reach desired caloric intake, while maintaining appropriate intake of nutrient and fiber, sodium and fats, and appropriate energy expenditure required for the weight goal.;Weight Management: Provide education and appropriate resources to help participant work on and attain dietary goals.    Admit Weight 174 lb 6.4 oz (79.1 kg)    Goal Weight: Short Term 174 lb (78.9 kg)    Goal Weight: Long Term 174 lb (78.9 kg)    Expected Outcomes Short Term: Continue to assess and modify interventions until short term weight is achieved;Weight Maintenance: Understanding of the daily  nutrition guidelines, which includes 25-35% calories from fat, 7% or less cal from saturated fats, less than 256m cholesterol, less than 1.5gm of sodium, & 5 or more servings of fruits  and vegetables daily;Long Term: Adherence to nutrition and physical activity/exercise program aimed toward attainment of established weight goal    Hypertension Yes    Intervention Monitor prescription use compliance.;Provide education on lifestyle modifcations including regular physical activity/exercise, weight management, moderate sodium restriction and increased consumption of fresh fruit, vegetables, and low fat dairy, alcohol moderation, and smoking cessation.    Expected Outcomes Short Term: Continued assessment and intervention until BP is < 140/69m HG in hypertensive participants. < 130/84mHG in hypertensive participants with diabetes, heart failure or chronic kidney disease.;Long Term: Maintenance of blood pressure at goal levels.             Education:Diabetes - Individual verbal and written instruction to review signs/symptoms of diabetes, desired ranges of glucose level fasting, after meals and with exercise. Acknowledge that pre and post exercise glucose checks will be done for 3 sessions at entry of program.   Core Components/Risk Factors/Patient Goals Review:   Goals and Risk Factor Review     Row Name 10/07/20 1122 10/31/20 1109           Core Components/Risk Factors/Patient Goals Review   Personal Goals Review Weight Management/Obesity Weight Management/Obesity      Review DoMarden Nobleants to gain a littlw weight but wants to gain muscle. He feels a little weak from when he was septic. He is watching his diet and doing well in rehab. DoMarden Nobles feeling like he has more strength and can walk better since starting rehab.  He reports taking all medications as directed.      Expected Outcomes Short: gain some weight. Long: maintain muscle growth independently Short; continue to attend consistently  Long: build overall strength               Core Components/Risk Factors/Patient Goals at Discharge (Final Review):   Goals and Risk Factor Review - 10/31/20 1109       Core Components/Risk Factors/Patient Goals Review   Personal Goals Review Weight Management/Obesity    Review DoMarden Nobles feeling like he has more strength and can walk better since starting rehab.  He reports taking all medications as directed.    Expected Outcomes Short; continue to attend consistently Long: build overall strength             ITP Comments:  ITP Comments     Row Name 08/22/20 1443 09/12/20 1249 09/14/20 1000 09/21/20 0705 10/19/20 0633   ITP Comments Initial telephone orientation completed. [place] EP orientation scheduled for Monday 5/23 at 10:30am. Completed 6MWT and gym orientation. Initial ITP created and sent for review to Dr. MaEmily FilbertMedical Director. First full day of exercise!  Patient was oriented to gym and equipment including functions, settings, policies, and procedures.  Patient's individual exercise prescription and treatment plan were reviewed.  All starting workloads were established based on the results of the 6 minute walk test done at initial orientation visit.  The plan for exercise progression was also introduced and progression will be customized based on patient's performance and goals. 30 Day review completed. Medical Director ITP review done, changes made as directed, and signed approval by Medical Director.   New to program 30 Day review completed. Medical Director ITP review done, changes made as directed, and signed approval by Medical Director.    RoBrittoname 11/16/20 0701           ITP Comments 30 Day review completed. Medical Director ITP review done, changes  made as directed, and signed approval by Medical Director.                Comments:

## 2020-11-16 NOTE — Patient Instructions (Signed)
Discharge Patient Instructions  Patient Details  Name: Dalton Roy MRN: [id number] Date of Birth: [birth year] Referring Provider:  Bennie Pierini, MD   Number of Visits: 28  Reason for Discharge:  Patient reached a stable level of exercise. Patient independent in their exercise. Patient has met program and personal goals.  Smoking History:  Social History   Tobacco Use  Smoking Status Former   Types: Cigarettes   Quit date: 04/09/1966   Years since quitting: 46.6  Smokeless Tobacco Former    Diagnosis:  S/P CABG x 3  Initial Exercise Prescription:  Initial Exercise Prescription - 09/12/20 1200       Date of Initial Exercise RX and Referring Provider   Date 09/12/20    Referring Provider Tammy Sours ([place])      Recumbant Bike   Level 1    RPM 50    Watts 5    Minutes 15    METs 1.5      NuStep   Level 1    SPM 80    Minutes 15    METs 1.5      Track   Laps 25    Minutes 15    METs 2.4      Prescription Details   Frequency (times per week) 3    Duration Progress to 30 minutes of continuous aerobic without signs/symptoms of physical distress      Intensity   THRR 40-80% of Max Heartrate 101-128    Ratings of Perceived Exertion 11-13    Perceived Dyspnea 0-4      Progression   Progression Continue to progress workloads to maintain intensity without signs/symptoms of physical distress.      Resistance Training   Training Prescription Yes    Weight 4 lb    Reps 10-15             Discharge Exercise Prescription (Final Exercise Prescription Changes):  Exercise Prescription Changes - 11/14/20 1400       Response to Exercise   Blood Pressure (Admit) 124/72    Blood Pressure (Exit) 116/62    Heart Rate (Admit) 72 bpm    Heart Rate (Exercise) 89 bpm    Heart Rate (Exit) 77 bpm    Rating of Perceived Exertion (Exercise) 13    Symptoms none    Duration Continue with 30 min of aerobic exercise without signs/symptoms of physical distress.     Intensity THRR unchanged      Progression   Progression Continue to progress workloads to maintain intensity without signs/symptoms of physical distress.    Average METs 3.16      Resistance Training   Training Prescription Yes    Weight 4 lb    Reps 10-15      Interval Training   Interval Training No      NuStep   Level 5    Minutes 15    METs 3.8      Track   Laps 39    Minutes 15    METs 3.12      Home Exercise Plan   Plans to continue exercise at [employer] (comment)   [employer] and walk at home   Frequency Add 1 additional day to program exercise sessions.    Initial Home Exercises Provided 10/31/20             Functional Capacity:  6 Minute Walk     Row Name 09/12/20 1249 11/11/20 1125       6  Minute Walk   Phase Initial Discharge    Distance 990 feet 1250 feet    Distance % Change -- 26.3 %    Distance Feet Change -- 260 ft    Walk Time 6 minutes 6 minutes    # of Rest Breaks 0 0    MPH 1.88 2.37    METS 1.97 2.57    RPE 13 13    Perceived Dyspnea  1 --    VO2 Peak 6.91 9.05    Symptoms Yes (comment) No    Comments SOB --    Resting HR 75 bpm 72 bpm    Resting BP 110/62 124/72    Resting Oxygen Saturation  98 % 97 %    Exercise Oxygen Saturation  during 6 min walk 97 % 97 %    Max Ex. HR 79 bpm 89 bpm    Max Ex. BP 128/70 142/72    2 Minute Post BP 106/56 --            Nutrition:  Nutrition Therapy & Goals - 09/19/20 1125       Nutrition Therapy   Diet Heart healthy, low Na    Drug/Food Interactions Statins/Certain Fruits    Protein (specify units) 65g    Fiber 30 grams    Whole Grain Foods 3 servings    Saturated Fats 12 max. grams    Fruits and Vegetables 8 servings/day    Sodium 1.5 grams      Personal Nutrition Goals   Nutrition Goal ST: continue following RD at [place], continue with current changes. LT: continue to gain weight/muscle    Comments 2 meals per day. He has not been sleeping well since the hospital and in the  last couple of days has gotten worse. Breakfast/lunch burrito in am (onions, red peppers, hashbrowns, veggie sausage, sour cream and salsa) S: fruit D: bowl of homemade vegetables  Drinks: lemonade -(sweet), tea (unsweet), water. RD gave him meals, psychologist has helped him with eating (mindset change). Will get salmon at [facility] (salmon and rice and broccoli)mashed potato, pintos, mac and cheese. Will sometimes make his own tuna salad (1tbsp of each thing he will normally eat),. [employer] - potato salad, pintos, and okra. will drink 1-2 ensure clear per day (usually 1x/day). He is gaining weight. He would like to continue with what he is doing.             Goals reviewed with patient; copy given to patient.

## 2020-11-16 NOTE — Progress Notes (Signed)
Daily Session Note  Patient Details  Name: Dalton Roy MRN: [id number] Date of Birth: 1940-06-26 Referring Provider:   Flowsheet Row Cardiac Rehab from 09/12/2020 in [facility] Cardiac and Pulmonary Rehab  Referring Provider Tammy Sours ([place])       Encounter Date: 11/16/2020  Check In:  Session Check In - 11/16/20 1058       Check-In   Supervising physician immediately available to respond to emergencies See telemetry face sheet for immediately available ER MD    Location [facility]-Cardiac & Pulmonary Rehab    Staff Present Birdie Sons, MPA, Nino Glow, MS, ASCM CEP, Exercise Physiologist;Amanda Oletta Darter, BA, ACSM CEP, Exercise Physiologist;Melissa Caiola, RDN, LDN    Virtual Visit No    Medication changes reported     No    Fall or balance concerns reported    No    Warm-up and Cool-down Performed on first and last piece of equipment    Resistance Training Performed Yes    VAD Patient? No    PAD/SET Patient? No      Pain Assessment   Currently in Pain? No/denies                Social History   Tobacco Use  Smoking Status Former   Types: Cigarettes   Quit date: 04/09/1966   Years since quitting: 62.6  Smokeless Tobacco Former    Goals Met:  Independence with exercise equipment Exercise tolerated well No report of cardiac concerns or symptoms Strength training completed today  Goals Unmet:  Not Applicable  Comments: Pt able to follow exercise prescription today without complaint.  Will continue to monitor for progression.    Dr. Emily Filbert is Medical Director for [name].  Dr. Ottie Glazier is Medical Director for [facility] Pulmonary Rehabilitation.

## 2020-11-18 ENCOUNTER — Other Ambulatory Visit: Payer: Self-pay

## 2020-11-18 ENCOUNTER — Encounter: Payer: No Typology Code available for payment source | Admitting: *Deleted

## 2020-11-18 DIAGNOSIS — Z951 Presence of aortocoronary bypass graft: Secondary | ICD-10-CM

## 2020-11-18 DIAGNOSIS — Z48812 Encounter for surgical aftercare following surgery on the circulatory system: Secondary | ICD-10-CM | POA: Diagnosis not present

## 2020-11-18 NOTE — Progress Notes (Signed)
Daily Session Note  Patient Details  Name: Dalton Roy MRN: [id number] Date of Birth: [birth year] Referring Provider:   Flowsheet Row Cardiac Rehab from 09/12/2020 in [facility] Cardiac and Pulmonary Rehab  Referring Provider Tammy Sours ([place])       Encounter Date: 11/18/2020  Check In:  Session Check In - 11/18/20 1143       Check-In   Supervising physician immediately available to respond to emergencies See telemetry face sheet for immediately available ER MD    Location [facility]-Cardiac & Pulmonary Rehab    Staff Present Heath Lark, RN, BSN, CCRP;Melissa [place], RDN, LDN;Jessica [place], MA, RCEP, CCRP, CCET    Virtual Visit No    Medication changes reported     No    Fall or balance concerns reported    No    Warm-up and Cool-down Performed on first and last piece of equipment    Resistance Training Performed Yes    VAD Patient? No    PAD/SET Patient? No      Pain Assessment   Currently in Pain? No/denies                Social History   Tobacco Use  Smoking Status Former   Types: Cigarettes   Quit date: 04/09/1966   Years since quitting: 67.6  Smokeless Tobacco Former    Goals Met:  Independence with exercise equipment Exercise tolerated well No report of cardiac concerns or symptoms  Goals Unmet:  Not Applicable  Comments: Pt able to follow exercise prescription today without complaint.  Will continue to monitor for progression.    Dr. Emily Filbert is Medical Director for [place].  Dr. Ottie Glazier is Medical Director for [facility] Pulmonary Rehabilitation.

## 2020-11-21 ENCOUNTER — Other Ambulatory Visit: Payer: Self-pay

## 2020-11-21 ENCOUNTER — Encounter: Payer: No Typology Code available for payment source | Admitting: *Deleted

## 2020-11-21 DIAGNOSIS — Z951 Presence of aortocoronary bypass graft: Secondary | ICD-10-CM

## 2020-11-21 DIAGNOSIS — Z48812 Encounter for surgical aftercare following surgery on the circulatory system: Secondary | ICD-10-CM | POA: Diagnosis not present

## 2020-11-21 NOTE — Progress Notes (Signed)
Daily Session Note  Patient Details  Name: MARKEIS ALLMAN MRN: [id number] Date of Birth: 03-Jul-1940 Referring Provider:   Flowsheet Row Cardiac Rehab from 09/12/2020 in [facility]  Referring Provider Tammy Sours ([place])       Encounter Date: 11/21/2020  Check In:  Session Check In - 11/21/20 1133       Check-In   Supervising physician immediately available to respond to emergencies See telemetry face sheet for immediately available ER MD    Location [facility]-Cardiac & Pulmonary Rehab    Staff Present Renita Papa, RN BSN;Joseph Tessie Fass, RCP,RRT,BSRT;Kelly Niarada, MPA, [place], BS, ACSM CEP, Exercise Physiologist    Virtual Visit No    Medication changes reported     No    Fall or balance concerns reported    No    Warm-up and Cool-down Performed on first and last piece of equipment    Resistance Training Performed Yes    VAD Patient? No    PAD/SET Patient? No      Pain Assessment   Currently in Pain? No/denies                Social History   Tobacco Use  Smoking Status Former   Types: Cigarettes   Quit date: 04/09/1966   Years since quitting: 1.6  Smokeless Tobacco Former    Goals Met:  Independence with exercise equipment Exercise tolerated well No report of cardiac concerns or symptoms Strength training completed today  Goals Unmet:  Not Applicable  Comments: Pt able to follow exercise prescription today without complaint.  Will continue to monitor for progression.    Dr. Emily Filbert is Medical Director for [facility].  Dr. Ottie Glazier is Medical Director for [facility].

## 2020-11-23 ENCOUNTER — Other Ambulatory Visit: Payer: Self-pay

## 2020-11-23 DIAGNOSIS — Z951 Presence of aortocoronary bypass graft: Secondary | ICD-10-CM

## 2020-11-23 DIAGNOSIS — Z48812 Encounter for surgical aftercare following surgery on the circulatory system: Secondary | ICD-10-CM | POA: Diagnosis not present

## 2020-11-23 NOTE — Progress Notes (Signed)
Discharge Progress Report  Patient Details  Name: Dalton Roy MRN: [id number] Date of Birth: January 27, 1941 Referring Provider:   Flowsheet Row Cardiac Rehab from 09/12/2020 in [facility] Cardiac [facility] Pul[facility] Rehab  Referring Provider Tammy Sours [facility])        Number of Visits: 36  Reason for Discharge:  Patient reached a stable level of exercise. Patient independent in their exercise. Patient has met program and personal goals.  Smoking History:  Social History   Tobacco Use  Smoking Status Former   Types: Cigarettes   Quit date: 04/09/1966   Years since quitting: 110.6  Smokeless Tobacco Former    Diagnosis:  S/P CABG x 3  ADL UCSD:   Initial Exercise Prescription:  Initial Exercise Prescription - 09/12/20 1200       Date of Initial Exercise RX and Referring Provider   Date 09/12/20    Referring Provider [place]my [place]ours ([facility])      Recumbant Bike   Level 1    RPM 50    Watts 5    Minutes 15    METs 1.5      NuStep   Level 1    SPM 80    Minutes 15    METs 1.5      Track   Laps 25    Minutes 15    METs 2.4      Prescription Details   Frequency (times per week) 3    Duration Progress to 30 minutes of continuous aerobic without signs/symptoms of physical distress      Intensity   THRR 40-80% of Max Heartrate 101-128    Ratings of Perceived Exertion 11-13    Perceived Dyspnea 0-4      Progression   Progression Continue to progress workloads to maintain intensity without signs/symptoms of physical distress.      Resistance Training   Training Prescription Yes    Weight 4 lb    Reps 10-15             Discharge Exercise Prescription (Final Exercise Prescription Changes):  Exercise Prescription Changes - 11/14/20 1400       Response to Exercise   Blood Pressure (Admit) 124/72    Blood Pressure (Exit) 116/62    Heart Rate (Admit) 72 bpm    Heart Rate (Exercise) 89 bpm    Heart Rate (Exit) 77 bpm    Rating of Perceived Exertion (Exercise) 13     Symptoms none    Duration Continue with 30 min of aerobic exercise without signs/symptoms of physical distress.    Intensity THRR unchanged      Progression   Progression Continue to progress workloads to maintain intensity without signs/symptoms of physical distress.    Average METs 3.16      Resistance Training   Training Prescription Yes    Weight 4 lb    Reps 10-15      Interval Training   Interval Training No      NuStep   Level 5    Minutes 15    METs 3.8      Track   Laps 39    Minutes 15    METs 3.12      Home Exercise Plan   Plans to continue exercise at [employer] (comment)   [employer] and walk at home   Frequency Add 1 additional day to program exercise sessions.    Initial Home Exercises Provided 10/31/20  Functional Capacity:  6 Minute Walk     Row Name 09/12/20 1249 11/11/20 1125       6 Minute Walk   Phase Initial Discharge    Distance 990 feet 1250 feet    Distance % Change -- 26.3 %    Distance Feet Change -- 260 ft    Walk Time 6 minutes 6 minutes    # of Rest Breaks 0 0    MPH 1.88 2.37    METS 1.97 2.57    RPE 13 13    Perceived Dyspnea  1 --    VO2 Peak 6.91 9.05    Symptoms Yes (comment) No    Comments SOB --    Resting HR 75 bpm 72 bpm    Resting BP 110/62 124/72    Resting Oxygen Saturation  98 % 97 %    Exercise Oxygen Saturation  during 6 min walk 97 % 97 %    Max Ex. HR 79 bpm 89 bpm    Max Ex. BP 128/70 142/72    2 Minute Post BP 106/56 --             Psychological, QOL, Others - Outcomes: PHQ 2/9: Depression screen [facility] 2/9 10/31/2020 10/07/2020 09/12/2020 08/24/2015  Decreased Interest _0 Down, Depressed, Hopeless _1 PHQ - 2 Score _2 Altered sleeping _3 Tired, decreased energy _4 Change in appetite _5 Feeling bad or failure about yourself  0 0 0 0  Trouble concentrating _6 Moving slowly or fidgety/restless _7 Suicidal thoughts 0 0 0 0  PHQ-9 Score  _8 Difficult doing work/chores Somewhat difficult Somewhat difficult Somewhat difficult -    Quality of Life:  Quality of Life - 09/01/20 1536       Quality of Life   Select Quality of Life      Quality of Life Scores   Health/Function Pre 12.5 %    Socioeconomic Pre 23.63 %    Psych/Spiritual Pre 21.43 %    Family Pre 28.8 %    GLOBAL Pre 19.16 %                Nutrition & Weight - Outcomes:  Pre Biometrics - 09/12/20 1252       Pre Biometrics   Height 5' 11.9" (1.826 m)    Weight 174 lb 6.4 oz (79.1 kg)    BMI (Calculated) 23.73    Single Leg Stand 0.5 seconds             Post Biometrics - 11/11/20 1126        Post  Biometrics   Height 5' 11.9" (1.826 m)    Weight 177 lb 9.6 oz (80.6 kg)    BMI (Calculated) 24.16             Nutrition:  Nutrition Therapy & Goals - 09/19/20 1125       Nutrition Therapy   Diet Heart healthy, low Na    Drug/Food Interactions Statins/Certain Fruits    Protein (specify units) 65g    Fiber 30 grams    Whole Grain Foods 3 servings    Saturated Fats 12 max. grams    Fruits and Vegetables 8 servings/day    Sodium 1.5 grams      Personal Nutrition Goals   Nutrition  Goal ST: continue following RD at [facility], continue with current changes. LT: continue to gain weight/muscle    Comments 2 meals per day. He has not been sleeping well since the hospital and in the last couple of days has gotten worse. Breakfast/lunch burrito in am (onions, red peppers, hashbrowns, veggie sausage, sour cream and salsa) S: fruit D: bowl of homemade vegetables  Drinks: lemonade -(sweet), tea (unsweet), water. RD gave him meals, psychologist has helped him with eating (mindset change). Will get salmon at [facility] (salmon and rice and broccoli)mashed potato, pintos, mac and cheese. Will sometimes make his own tuna salad (1tbsp of each thing he will normally eat),. [employer] - potato salad, pintos, and okra. will drink 1-2 ensure clear per  day (usually 1x/day). He is gaining weight. He would like to continue with what he is doing.             Nutrition Discharge:   Education Questionnaire Score:  Knowledge Questionnaire Score - 11/21/20 1124       Knowledge Questionnaire Score   Post Score 25/26             Goals reviewed with patient; copy given to patient.

## 2020-11-23 NOTE — Progress Notes (Signed)
Cardiac Individual Treatment Plan  Patient Details  Name: Dalton Roy MRN: [id number] Date of Birth: 10-20-1940 Referring Provider:   Flowsheet Row Cardiac Rehab from 09/12/2020 in [facility] Cardiac and Pulmonary Rehab  Referring Provider Tammy Sours ([place])       Initial Encounter Date:  Flowsheet Row Cardiac Rehab from 09/12/2020 in [facility] Cardiac and Pulmonary Rehab  Date 09/12/20       Visit Diagnosis: S/P CABG x 3  Patient's Home Medications on Admission:  Current Outpatient Medications:    acetaminophen (TYLENOL) 325 MG tablet, Take 975 mg by mouth every 8 (eight) hours as needed., Disp: , Rfl:    allopurinol (ZYLOPRIM) 300 MG tablet, TAKE 1 TABLET(300 MG) BY MOUTH DAILY (Patient taking differently: Take 100 mg by mouth 3 (three) times a week. Every Monday, Wednesday and Friday.), Disp: 90 tablet, Rfl: 1   apixaban (ELIQUIS) 5 MG TABS tablet, Take 5 mg by mouth every 12 (twelve) hours., Disp: , Rfl:    aspirin EC 81 MG tablet, Take 81 mg by mouth daily with lunch., Disp: , Rfl:    atorvastatin (LIPITOR) 40 MG tablet, Take 20 mg by mouth See admin instructions. Take 20 mg at bedtime for 20 days, then take 40 mg at bedtime., Disp: , Rfl:    bisacodyl (DULCOLAX) 10 MG suppository, Place 10 mg rectally daily as needed for moderate constipation., Disp: , Rfl:    capsaicin (ZOSTRIX) 0.025 % cream, Apply 1 application topically every 12 (twelve) hours as needed (pain below right scapula)., Disp: , Rfl:    doxycycline (VIBRA-TABS) 100 MG tablet, Take 1 tablet (100 mg total) by mouth 2 (two) times daily., Disp: 20 tablet, Rfl: 0   ferrous sulfate 325 (65 FE) MG tablet, Take 325 mg by mouth at bedtime., Disp: , Rfl:    gabapentin (NEURONTIN) 300 MG capsule, Take 300 mg by mouth at bedtime., Disp: , Rfl:    hydrocortisone 2.5 % cream, Apply 1 application topically 3 (three) times daily as needed (hemorrhoidal itching)., Disp: , Rfl:    levothyroxine (SYNTHROID, LEVOTHROID) 75 MCG tablet, Take 1  tablet (75 mcg total) by mouth daily before breakfast. (Patient taking differently: Take 75 mcg by mouth daily.), Disp: 90 tablet, Rfl: 3   melatonin 5 MG TABS, Take 5 mg by mouth at bedtime as needed (sleep)., Disp: , Rfl:    metoprolol tartrate (LOPRESSOR) 25 MG tablet, Take 12.5 mg by mouth every 12 (twelve) hours., Disp: , Rfl:    mirtazapine (REMERON) 30 MG tablet, Take 30 mg by mouth at bedtime., Disp: , Rfl:    sodium bicarbonate 650 MG tablet, Take 650 mg by mouth 4 (four) times daily., Disp: , Rfl:    white petrolatum ointment, Apply 1 application topically in the morning and at bedtime., Disp: , Rfl:   Past Medical History: Past Medical History:  Diagnosis Date   Bipolar 1 disorder ([place])    Cancer ([place]) 20 years   skin   Gout    Heart murmur    Hypertension    Hypothyroidism    ITP (idiopathic thrombocytopenic purpura)    Migraines    Psoriasis     Tobacco Use: Social History   Tobacco Use  Smoking Status Former   Types: Cigarettes   Quit date: 04/09/1966   Years since quitting: 31.6  Smokeless Tobacco Former    Labs: Recent Government social research officer for [employer] Cardiac and Pulmonary Rehab Latest Ref Rng & Units 08/11/2015  Cholestrol 0 - 200 mg/dL 179   LDLCALC 0 - 99 mg/dL 107(H)   HDL >39.00 mg/dL 34.40(L)   Trlycerides 0.0 - 149.0 mg/dL 189.0(H)        Exercise Target Goals: Exercise Program Goal: Individual exercise prescription set using results from initial 6 min walk test and THRR while considering  patient's activity barriers and safety.   Exercise Prescription Goal: Initial exercise prescription builds to 30-45 minutes a day of aerobic activity, 2-3 days per week.  Home exercise guidelines will be given to patient during program as part of exercise prescription that the participant will acknowledge.   Education: Aerobic Exercise: - Group verbal and visual presentation on the components of exercise prescription. Introduces F.I.T.T principle from  ACSM for exercise prescriptions.  Reviews F.I.T.T. principles of aerobic exercise including progression. Written material given at graduation. Flowsheet Row Cardiac Rehab from 11/09/2020 in [facility] Cardiac and Pulmonary Rehab  Education need identified 09/12/20  Date 09/28/20  Educator [facility]  Instruction Review Code 1- Verbalizes Understanding       Education: Resistance Exercise: - Group verbal and visual presentation on the components of exercise prescription. Introduces F.I.T.T principle from ACSM for exercise prescriptions  Reviews F.I.T.T. principles of resistance exercise including progression. Written material given at graduation.    Education: Exercise & Equipment Safety: - Individual verbal instruction and demonstration of equipment use and safety with use of the equipment. Flowsheet Row Cardiac Rehab from 11/09/2020 in [facility] Cardiac and Pulmonary Rehab  Date 09/12/20  Educator [facility]  Instruction Review Code 1- Verbalizes Understanding       Education: Exercise Physiology & General Exercise Guidelines: - Group verbal and written instruction with models to review the exercise physiology of the cardiovascular system and associated critical values. Provides general exercise guidelines with specific guidelines to those with heart or lung disease.  Flowsheet Row Cardiac Rehab from 11/09/2020 in [facility] Cardiac and Pulmonary Rehab  Date 09/21/20  Educator [facility]  Instruction Review Code 1- Verbalizes Understanding       Education: Flexibility, Balance, Mind/Body Relaxation: - Group verbal and visual presentation with interactive activity on the components of exercise prescription. Introduces F.I.T.T principle from ACSM for exercise prescriptions. Reviews F.I.T.T. principles of flexibility and balance exercise training including progression. Also discusses the mind body connection.  Reviews various relaxation techniques to help reduce and manage stress (i.e. Deep breathing, progressive muscle  relaxation, and visualization). Balance handout provided to take home. Written material given at graduation. Flowsheet Row Cardiac Rehab from 11/09/2020 in [facility] Cardiac and Pulmonary Rehab  Date 10/12/20  Educator [facility]  Instruction Review Code 1- Verbalizes Understanding       Activity Barriers & Risk Stratification:  Activity Barriers & Cardiac Risk Stratification - 08/22/20 1412       Activity Barriers & Cardiac Risk Stratification   Activity Barriers Muscular Weakness;Assistive Device;Other (comment)    Comments uses cane at times; some cognitive impairment (wife states its not noticeable to many people) post sepsis    Cardiac Risk Stratification High             6 Minute Walk:  6 Minute Walk     Row Name 09/12/20 1249 11/11/20 1125       6 Minute Walk   Phase Initial Discharge    Distance 990 feet 1250 feet    Distance % Change -- 26.3 %    Distance Feet Change -- 260 ft    Walk Time 6 minutes 6 minutes    # of  Rest Breaks 0 0    MPH 1.88 2.37    METS 1.97 2.57    RPE 13 13    Perceived Dyspnea  1 --    VO2 Peak 6.91 9.05    Symptoms Yes (comment) No    Comments SOB --    Resting HR 75 bpm 72 bpm    Resting BP 110/62 124/72    Resting Oxygen Saturation  98 % 97 %    Exercise Oxygen Saturation  during 6 min walk 97 % 97 %    Max Ex. HR 79 bpm 89 bpm    Max Ex. BP 128/70 142/72    2 Minute Post BP 106/56 --             Oxygen Initial Assessment:   Oxygen Re-Evaluation:   Oxygen Discharge (Final Oxygen Re-Evaluation):   Initial Exercise Prescription:  Initial Exercise Prescription - 09/12/20 1200       Date of Initial Exercise RX and Referring Provider   Date 09/12/20    Ref[place] ([place])      Recumbant Bike   Level 1    RPM 50    Watts 5    Minutes 15    METs 1.5      NuStep   Level 1    SPM 80    Minutes 15    METs 1.5      Track   Laps 25    Minutes 15    METs 2.4      Prescription Details   Frequency  (times per week) 3    Duration Progress to 30 minutes of continuous aerobic without signs/symptoms of physical distress      Intensity   THRR 40-80% of Max Heartrate 101-128    Ratings of Perceived Exertion 11-13    Perceived Dyspnea 0-4      Progression   Progression Continue to progress workloads to maintain intensity without signs/symptoms of physical distress.      Resistance Training   Training Prescription Yes    Weight 4 lb    Reps 10-15             Perform Capillary Blood Glucose checks as needed.  Exercise Prescription Changes:   Exercise Prescription Changes     Row Name 09/12/20 1200 09/21/20 1300 10/06/20 1400 10/19/20 0900 10/31/20 1100     Response to Exercise   Blood Pressure (Admit) 110/62 102/80 122/68 118/64 --   Blood Pressure (Exercise) 128/70 128/68 148/72 -- --   Blood Pressure (Exit) 106/56 124/72 138/72 128/82 --   Heart Rate (Admit) 75 bpm 94 bpm 98 bpm 68 bpm --   Heart Rate (Exercise) 79 bpm 109 bpm 111 bpm 105 bpm --   Heart Rate (Exit) 83 bpm 75 bpm 104 bpm 74 bpm --   Oxygen Saturation (Admit) 98 % -- -- -- --   Oxygen Saturation (Exercise) 97 % -- -- -- --   Rating of Perceived Exertion (Exercise) _0 --   Perceived Dyspnea (Exercise) 1 -- -- -- --   Symptoms SOB -- -- none --   Comments walk test results -- -- -- --   Duration -- Continue with 30 min of aerobic exercise without signs/symptoms of physical distress. Continue with 30 min of aerobic exercise without signs/symptoms of physical distress. Continue with 30 min of aerobic exercise without signs/symptoms of physical distress. --   Intensity -- THRR unchanged THRR unchanged THRR unchanged --  Progression   Progression -- -- Continue to progress workloads to maintain intensity without signs/symptoms of physical distress. Continue to progress workloads to maintain intensity without signs/symptoms of physical distress. --   Average METs -- -- 3.2 2.95 --     Resistance  Training   Training Prescription -- Yes Yes Yes --   Weight -- 3 lb 4 lb 4 lb --   Reps -- 10-15 10-15 10-15 --     Interval Training   Interval Training -- -- -- No --     NuStep   Level -- _0 --   SPM -- 80 80 -- --   Minutes -- _1 --   METs -- 2.5 3.2 3.2 --     Track   Laps -- _2 --   Minutes -- _3 --   METs -- 2.6 2.41 2.69 --     Home Exercise Plan   Plans to continue exercise at -- -- -- -- [employer] (comment)  [employer] and walk at home   Frequency -- -- -- -- Add 1 additional day to program exercise sessions.   Initial Home Exercises Provided -- -- -- -- 10/31/20    Row Name 11/02/20 1300 11/14/20 1400           Response to Exercise   Blood Pressure (Admit) 118/72 124/72      Blood Pressure (Exit) 126/76 116/62      Heart Rate (Admit) 78 bpm 72 bpm      Heart Rate (Exercise) 87 bpm 89 bpm      Heart Rate (Exit) 78 bpm 77 bpm      Rating of Perceived Exertion (Exercise) 15 13      Symptoms none none      Duration Continue with 30 min of aerobic exercise without signs/symptoms of physical distress. Continue with 30 min of aerobic exercise without signs/symptoms of physical distress.      Intensity THRR unchanged THRR unchanged             Progression   Progression Continue to progress workloads to maintain intensity without signs/symptoms of physical distress. Continue to progress workloads to maintain intensity without signs/symptoms of physical distress.      Average METs 3.3 3.16             Resistance Training   Training Prescription Yes Yes      Weight 4 lb 4 lb      Reps 10-15 10-15             Interval Training   Interval Training No No             NuStep   Level -- 5      Minutes -- 15      METs -- 3.8             Track   Laps -- 39      Minutes -- 15      METs -- 3.12             Home Exercise Plan   Plans to continue exercise at [employer] (comment)  [employer] and walk at home [employer]  (comment)  [employer] and walk at home      Frequency Add 1 additional day to program exercise sessions. Add 1 additional day to program exercise sessions.      Initial Home Exercises Provided 10/31/20 10/31/20  Exercise Comments:   Exercise Comments     Row Name 09/14/20 1000 11/23/20 1122         Exercise Comments First full day of exercise!  Patient was oriented to gym and equipment including functions, settings, policies, and procedures.  Patient's individual exercise prescription and treatment plan were reviewed.  All starting workloads were established based on the results of the 6 minute walk test done at initial orientation visit.  The plan for exercise progression was also introduced and progression will be customized based on patient's performance and goals. Ordean graduated today from  rehab with 36 sessions completed.  Details of the patient's exercise prescription and what He needs to do in order to continue the prescription and progress were discussed with patient.  Patient was given a copy of prescription and goals.  Patient verbalized understanding.  Jakarri plans to continue exercising by going to a community facility.               Exercise Goals and Review:   Exercise Goals     Row Name 09/12/20 1251             Exercise Goals   Increase Physical Activity Yes       Intervention Provide advice, education, support and counseling about physical activity/exercise needs.;Develop an individualized exercise prescription for aerobic and resistive training based on initial evaluation findings, risk stratification, comorbidities and participant's personal goals.       Expected Outcomes Short Term: Attend rehab on a regular basis to increase amount of physical activity.;Long Term: Add in home exercise to make exercise part of routine and to increase amount of physical activity.;Long Term: Exercising regularly at least 3-5 days a week.       Increase Strength  and Stamina Yes       Intervention Provide advice, education, support and counseling about physical activity/exercise needs.;Develop an individualized exercise prescription for aerobic and resistive training based on initial evaluation findings, risk stratification, comorbidities and participant's personal goals.       Expected Outcomes Short Term: Increase workloads from initial exercise prescription for resistance, speed, and METs.;Short Term: Perform resistance training exercises routinely during rehab and add in resistance training at home;Long Term: Improve cardiorespiratory fitness, muscular endurance and strength as measured by increased METs and functional capacity (6MWT)       Able to understand and use rate of perceived exertion (RPE) scale Yes       Intervention Provide education and explanation on how to use RPE scale       Expected Outcomes Short Term: Able to use RPE daily in rehab to express subjective intensity level;Long Term:  Able to use RPE to guide intensity level when exercising independently       Able to understand and use Dyspnea scale Yes       Intervention Provide education and explanation on how to use Dyspnea scale       Expected Outcomes Short Term: Able to use Dyspnea scale daily in rehab to express subjective sense of shortness of breath during exertion;Long Term: Able to use Dyspnea scale to guide intensity level when exercising independently       Knowledge and understanding of Target Heart Rate Range (THRR) Yes       Intervention Provide education and explanation of THRR including how the numbers were predicted and where they are located for reference       Expected Outcomes Short Term: Able to state/look up THRR;Short Term: Able to use  daily as guideline for intensity in rehab;Long Term: Able to use THRR to govern intensity when exercising independently       Able to check pulse independently Yes       Intervention Provide education and demonstration on how to check  pulse in carotid and radial arteries.;Review the importance of being able to check your own pulse for safety during independent exercise       Expected Outcomes Short Term: Able to explain why pulse checking is important during independent exercise;Long Term: Able to check pulse independently and accurately       Understanding of Exercise Prescription Yes       Intervention Provide education, explanation, and written materials on patient's individual exercise prescription       Expected Outcomes Short Term: Able to explain program exercise prescription;Long Term: Able to explain home exercise prescription to exercise independently                Exercise Goals Re-Evaluation :  Exercise Goals Re-Evaluation     Row Name 09/14/20 1000 09/21/20 1357 10/06/20 1419 10/19/20 0859 10/31/20 1117     Exercise Goal Re-Evaluation   Exercise Goals Review Increase Physical Activity;Able to understand and use rate of perceived exertion (RPE) scale;Knowledge and understanding of Target Heart Rate Range (THRR);Understanding of Exercise Prescription;Increase Strength and Stamina;Able to understand and use Dyspnea scale;Able to check pulse independently Increase Physical Activity;Increase Strength and Stamina Increase Physical Activity;Increase Strength and Stamina Increase Physical Activity;Increase Strength and Stamina;Understanding of Exercise Prescription Increase Physical Activity;Increase Strength and Stamina   Comments Reviewed RPE and dyspnea scales, THR and program prescription with pt today.  Pt voiced understanding and was given a copy of goals to take home. Marden Noble is progressing well.  He moved up to level 4 on NS and did 30 laps on track!  We will continue to monitor progress. Marden Noble is at the same level since last review.  Staff will encourage increasing level on seated equipment. Marden Noble is doing well in rehab.  He is up to 3.8 METs on the NuStep and 31 laps on the track.  We will conitnue to monitor his  progress. Reviewed home exercise with pt today.  Pt plans to walk for exercise.  Reviewed THR, pulse, RPE, sign and symptoms, pulse oximetery and when to call 911 or MD.  Also discussed weather considerations and indoor options.  Pt voiced understanding.  We reviewed doing squats holding on to kitchen sink so he doesnt lose balance   Expected Outcomes Short: Use RPE daily to regulate intensity. Long: Follow program prescription in THR. Short: attend consistently Long:  increase stamina Short: increase load on seated equipment Long:  improve overall MET level Short: Continue to aim for more laps  Long: Continue to improve stamina Short: work on squats at home Long: build strength    [place] Name 11/14/20 1419             Exercise Goal Re-Evaluation   Exercise Goals Review Increase Physical Activity;Increase Strength and Stamina       Comments Marden Noble is doing well. He is up to 39 laps on the track as well as level 5 on the T4 Nustep. He improved his post 6MWT by 26%!! He will be graduating soon and we will continue to monitor his progression until then.       Expected Outcomes Short: Graduate Long: Continue to exercise at home independently at appropriate prescription  Discharge Exercise Prescription (Final Exercise Prescription Changes):  Exercise Prescription Changes - 11/14/20 1400       Response to Exercise   Blood Pressure (Admit) 124/72    Blood Pressure (Exit) 116/62    Heart Rate (Admit) 72 bpm    Heart Rate (Exercise) 89 bpm    Heart Rate (Exit) 77 bpm    Rating of Perceived Exertion (Exercise) 13    Symptoms none    Duration Continue with 30 min of aerobic exercise without signs/symptoms of physical distress.    Intensity THRR unchanged      Progression   Progression Continue to progress workloads to maintain intensity without signs/symptoms of physical distress.    Average METs 3.16      Resistance Training   Training Prescription Yes    Weight 4 lb    Reps  10-15      Interval Training   Interval Training No      NuStep   Level 5    Minutes 15    METs 3.8      Track   Laps 39    Minutes 15    METs 3.12      Home Exercise Plan   Plans to continue exercise at [employer] (comment)   [employer] and walk at home   Frequency Add 1 additional day to program exercise sessions.    Initial Home Exercises Provided 10/31/20             Nutrition:  Target Goals: Understanding of nutrition guidelines, daily intake of sodium <1545m, cholesterol <2075m calories 30% from fat and 7% or less from saturated fats, daily to have 5 or more servings of fruits and vegetables.  Education: All About Nutrition: -Group instruction provided by verbal, written material, interactive activities, discussions, models, and posters to present general guidelines for heart healthy nutrition including fat, fiber, MyPlate, the role of sodium in heart healthy nutrition, utilization of the nutrition label, and utilization of this knowledge for meal planning. Follow up email sent as well. Written material given at graduation. Flowsheet [facility]  Date 10/19/20  [facility] Code 1- Verbalizes Understanding       Biometrics:  Pre Biometrics - 09/12/20 1252       Pre Biometrics   Height 5' 11.9" (1.826 m)    Weight 174 lb 6.4 oz (79.1 kg)    BMI (Calculated) 23.73    Single Leg Stand 0.5 seconds             Post Biometrics - 11/11/20 1126        Post  Biometrics   Height 5' 11.9" (1.826 m)    Weight 177 lb 9.6 oz (80.6 kg)    BMI (Calculated) 24.16             Nutrition Therapy Plan and Nutrition Goals:  Nutrition Therapy & Goals - 09/19/20 1125       Nutrition Therapy   Diet Heart healthy, low Na    Drug/Food Interactions Statins/Certain Fruits    Protein (specify units) 65g    Fiber 30 grams    Whole Grain Foods 3 servings    Saturated Fats 12 max. grams     Fruits and Vegetables 8 servings/day    Sodium 1.5 grams      Personal Nutrition Goals   Nutrition Goal ST: [facility] [place] [facility] [place]. LT: continue to gain weight/muscle  Comments 2 meals per day. He has not been sleeping well since the hospital and in the last couple of days has gotten worse. Breakfast/lunch burrito in am (onions, red peppers, hashbrowns, veggie sausage, sour cream and salsa) S: fruit D: bowl of homemade vegetables  Drinks: lemonade -(sweet), tea (unsweet), water. RD gave him meals, psychologist has helped him with eating (mindset change). Will get salmon at [facility] (salmon and rice and broccoli)mashed potato, pintos, mac and cheese. Will sometimes make his own tuna salad (1tbsp of each thing he will normally eat),. [place] [employer] - potato salad, pintos, and okra. will drink 1-2 ensure clear per day (usually 1x/day). He is gaining weight. He would like to continue with what he is doing.             Nutrition Assessments:  MEDIFICTS Score Key: ?70 Need to make dietary changes  40-70 Heart Healthy Diet ? 40 Therapeutic Level Cholesterol Diet  Flowsheet Row Cardiac Rehab from 11/21/2020 in [facility] Cardiac and Pulmonary Rehab  Picture Your Plate Total Score on Discharge 76      Picture Your Plate Scores: <25 Unhealthy dietary pattern with much room for improvement. 41-50 Dietary pattern unlikely to meet recommendations for good health and room for improvement. 51-60 More healthful dietary pattern, with some room for improvement.  >60 Healthy dietary pattern, although there may be some specific behaviors that could be improved.    Nutrition Goals Re-Evaluation:  Nutrition Goals Re-Evaluation     [place] Name 10/07/20 1113 10/31/20 1116           Goals   Current Weight 177 lb (80.3 kg) --      Nutrition Goal ST: continue following RD at [facility], continue with current changes. LT: continue to gain weight/muscle --      Comment Marden Noble is  following his diet given by the [place] and is working on gaining some muscle. 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 Participant is able to verbalize types and ability to use techniques and skills needed for reducing stress and depression.   Education: Stress, Anxiety, and Depression - Group verbal and visual presentation to define topics covered.  Reviews how body is impacted by stress, anxiety, and depression.  Also discusses healthy ways to reduce stress and to treat/manage anxiety and depression.  Written material given at graduation. Flowsheet Row Cardiac Rehab from 11/09/2020 in [facility] Cardiac and Pulmonary Rehab  Date 09/14/20  Educator [facility]  Instruction Review Code 1- [employer] Understanding       Education: Sleep Hygiene -Provides group verbal and written instruction about how sleep can affect your health.  Define sleep hygiene, discuss sleep cycles and impact of sleep habits. Review good sleep  hygiene tips.    Initial Review & Psychosocial Screening:  Initial Psych Review & Screening - 08/22/20 1420       Initial Review   Current issues with Current Stress Concerns;History of Depression;Current Depression;Current Anxiety/Panic    Source of Stress Concerns Chronic Illness;Unable to perform yard/household  activities;Unable to participate in former interests or hobbies      [place]? Yes      Barriers   Psychosocial barriers to participate in program There are no identifiable barriers or psychosocial needs.;The patient should benefit from training in stress management and relaxation.      Screening Interventions   Interventions Provide feedback about the scores to participant;Encouraged to exercise;To provide support and resources with identified psychosocial needs    Expected Outcomes Short Term goal: Utilizing psychosocial counselor, staff and physician to assist with identification of specific Stressors or current issues interfering with healing process. Setting desired goal for each stressor or current issue identified.;Long Term Goal: Stressors or current issues are controlled or eliminated.;Short Term goal: Identification and review with participant of any Quality of Life or Depression concerns found by scoring the questionnaire.;Long Term goal: The participant improves quality of Life and PHQ9 Scores as seen by post scores and/or verbalization of changes             Quality of Life Scores:   Quality of Life - 09/01/20 1536       Quality of Life   Select Quality of Life      Quality of Life Scores   Health/Function Pre 12.5 %    Socioeconomic Pre 23.63 %    Psych/Spiritual Pre 21.43 %    Family Pre 28.8 %    GLOBAL Pre 19.16 %            Scores of 19 and below usually indicate a poorer quality of life in these areas.  A difference of  2-3 points is a clinically meaningful difference.  A difference of 2-3 points in the total score of the Quality of Life Index has been associated with significant improvement in overall quality of life, self-image, physical symptoms, and general health in studies assessing change in quality of life.  PHQ-9: Recent Review Flowsheet Data     Depression screen [facility] 2/9 10/31/2020 10/07/2020 09/12/2020 08/24/2015    Decreased Interest _0 Down, Depressed, Hopeless _1 PHQ - 2 Score _2 Altered sleeping _3 Tired, decreased energy _4 Change in appetite _5 Feeling bad or failure about yourself  0 0 0 0   Trouble concentrating _6 Moving slowly or fidgety/restless _7 Suicidal thoughts 0 0 0 0   PHQ-9 Score _8 Difficult doing work/chores Somewhat difficult Somewhat difficult Somewhat difficult -      Interpretation of Total Score  Total Score Depression Severity:  1-4 = Minimal depression, 5-9 = Mild depression, 10-14 = Moderate depression, 15-19 = Moderately severe depression, 20-27 = Severe depression   Psychosocial Evaluation and Intervention:  Psychosocial Evaluation - 08/22/20 1444       Psychosocial Evaluation & Interventions   Interventions Encouraged to exercise with the program and follow exercise prescription;Stress management education;Relaxation education    Comments 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 His wife states his cognitive abilities have become impaired since the sepsis so she is his main caregiver. She planned her cardiac rehab around him, so they will be coming together. 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 During PT he was told his strength is there, he needs to work on his stamina so he is excited to come to cardiac rehab to work on this. He has had a rough year and is ready to start feeling more like himself and start doing the things/hobbies he misses.    Expected Outcomes Short: attend cardiac rehab for education and exercise. Long: develop positive self care habits.    Continue Psychosocial Services  Follow up required by staff             Psychosocial Re-Evaluation:  Psychosocial Re-Evaluation     [place] Name 10/07/20 1120 10/31/20 1112            Psychosocial Re-Evaluation   Current issues with Current Stress Concerns;Current Depression;History of Depression Current Stress Concerns;Current Depression;History of Depression      Comments Reviewed patient health questionnaire (PHQ-9) with patient for follow up. Previously, patients score indicated signs/symptoms of depression.  Reviewed to see if patient is improving symptom wise while in program.  Score improved and patient states that it is because he has been able to exercise. Reviewed PHQ-9 - no change from last time.  He has PTSD and doesnt sleep well.      Expected Outcomes Short: Continue to attend [facility] regularly for regular exercise and social engagement. Long: Continue to improve symptoms and manage a positive mental state. Short: continue meds as directed Long: continue to work on improving sleep patterns      Interventions Encouraged to attend Cardiac Rehabilitation for the exercise --      Continue Psychosocial Services  Follow up required by staff --               Psychosocial Discharge (Final Psychosocial Re-Evaluation):  Psychosocial Re-Evaluation - 10/31/20 1112       Psychosocial Re-Evaluation   Current issues with Current Stress Concerns;Current Depression;History of Depression    Comments Reviewed PHQ-9 - no change from last time.  He has PTSD and doesnt sleep well.    Expected Outcomes Short: continue meds as directed Long: continue to work on improving sleep patterns             Vocational Rehabilitation: Provide vocational rehab assistance to qualifying candidates.   Vocational Rehab Evaluation & Intervention:  Vocational Rehab - 08/22/20 1420       Initial Vocational Rehab Evaluation & Intervention   Assessment shows need for Vocational Rehabilitation No             Education: Education Goals: Education classes will be provided on a variety of topics geared toward better understanding of heart health and risk factor modification.  Participant will state understanding/return demonstration of topics presented as noted by education test scores.  Learning Barriers/Preferences:  Learning Barriers/Preferences - 08/22/20 1416       Learning Barriers/Preferences   Learning Barriers None   some cognitive issues   Learning Preferences Individual Instruction             General Cardiac Education Topics:  AED/CPR: - Group verbal and written instruction with the use of models to demonstrate the basic use of the AED with  the basic ABC's of resuscitation.   Anatomy and Cardiac Procedures: - Group verbal and visual presentation and models provide information about basic cardiac anatomy and function. Reviews the testing methods done to diagnose heart disease and the outcomes of the test results. Describes the treatment choices: Medical Management, Angioplasty, or Coronary Bypass Surgery for treating various heart conditions including Myocardial Infarction, Angina, Valve Disease, and Cardiac Arrhythmias.  Written material given at graduation.   Medication Safety: - Group verbal and visual instruction to review commonly prescribed medications for heart and lung disease. Reviews the medication, class of the drug, and side effects. Includes the steps to properly store meds and maintain the prescription regimen.  Written material given at graduation. Flowsheet Row Cardiac Rehab from 11/09/2020 in [facility] Cardiac and Pulmonary Rehab  Date 10/26/20  Educator SB  Instruction Review Code 1- Verbalizes Understanding       Intimacy: - Group verbal instruction through game format to discuss how heart and lung disease can affect sexual intimacy. Written material given at graduation.. Flowsheet Row Cardiac Rehab from 11/09/2020 in [facility] Cardiac and Pulmonary Rehab  Date 09/28/20  Educator [facility]  Instruction Review Code 1- Verbalizes Understanding       Know Your Numbers and Heart Failure: - Group verbal and visual instruction to discuss  disease risk factors for cardiac and pulmonary disease and treatment options.  Reviews associated critical values for Overweight/Obesity, Hypertension, Cholesterol, and Diabetes.  Discusses basics of heart failure: signs/symptoms and treatments.  Introduces Heart Failure Zone chart for action plan for heart failure.  Written material given at graduation. Flowsheet Row Cardiac Rehab from 11/09/2020 in [facility] Cardiac and Pulmonary Rehab  Date 11/02/20  Educator [facility]  Instruction Review Code 1- Verbalizes Understanding       Infection Prevention: - Provides verbal and written material to individual with discussion of infection control including proper hand washing and proper equipment cleaning during exercise session. Flowsheet Row Cardiac Rehab from 11/09/2020 in [facility] Cardiac and Pulmonary Rehab  Date 09/12/20  Educator [facility]  Instruction Review Code 1- Verbalizes Understanding       Falls Prevention: - Provides verbal and written material to individual with discussion of falls prevention and safety. Flowsheet Row Cardiac Rehab from 11/09/2020 in [facility] Cardiac and Pulmonary Rehab  Date 09/12/20  Educator [facility]  Instruction Review Code 1- Verbalizes Understanding       Other: -Provides group and verbal instruction on various topics (see comments)   Knowledge Questionnaire Score:  Knowledge Questionnaire Score - 11/21/20 1124       Knowledge Questionnaire Score   Post Score 25/26             Core Components/Risk Factors/Patient Goals at Admission:  Personal Goals and Risk Factors at Admission - 09/12/20 1254       Core Components/Risk Factors/Patient Goals on Admission    Weight Management Yes;Weight Maintenance    Intervention Weight Management: Develop a combined nutrition and exercise program designed to reach desired caloric intake, while maintaining appropriate intake of nutrient and fiber, sodium and fats, and appropriate energy expenditure required for the weight goal.;Weight  Management: Provide education and appropriate resources to help participant work on and attain dietary goals.    Admit Weight 174 lb 6.4 oz (79.1 kg)    Goal Weight: Short Term 174 lb (78.9 kg)    Goal Weight: Long Term 174 lb (78.9 kg)    Expected Outcomes Short Term: Continue to assess and modify interventions until short term weight is achieved;Weight  Maintenance: Understanding of the daily nutrition guidelines, which includes 25-35% calories from fat, 7% or less cal from saturated fats, less than 232m cholesterol, less than 1.5gm of sodium, & 5 or more servings of fruits and vegetables daily;Long Term: Adherence to nutrition and physical activity/exercise program aimed toward attainment of established weight goal    Hypertension Yes    Intervention Monitor prescription use compliance.;Provide education on lifestyle modifcations including regular physical activity/exercise, weight management, moderate sodium restriction and increased consumption of fresh fruit, vegetables, and low fat dairy, alcohol moderation, and smoking cessation.    Expected Outcomes Short Term: Continued assessment and intervention until BP is < 140/942mHG in hypertensive participants. < 130/8055mG in hypertensive participants with diabetes, heart failure or chronic kidney disease.;Long Term: Maintenance of blood pressure at goal levels.             Education:Diabetes - Individual verbal and written instruction to review signs/symptoms of diabetes, desired ranges of glucose level fasting, after meals and with exercise. Acknowledge that pre and post exercise glucose checks will be done for 3 sessions at entry of program.   Core Components/Risk Factors/Patient Goals Review:   Goals and Risk Factor Review     Row Name 10/07/20 1122 10/31/20 1109           Core Components/Risk Factors/Patient Goals Review   Personal Goals Review Weight Management/Obesity Weight Management/Obesity      Review DouMarden Noblents to gain a  littlw weight but wants to gain muscle. He feels a little weak from when he was septic. He is watching his diet and doing well in rehab. DouMarden Noble feeling like he has more strength and can walk better since starting rehab.  He reports taking all medications as directed.      Expected Outcomes Short: gain some weight. Long: maintain muscle growth independently Short; continue to attend consistently Long: build overall strength               Core Components/Risk Factors/Patient Goals at Discharge (Final Review):   Goals and Risk Factor Review - 10/31/20 1109       Core Components/Risk Factors/Patient Goals Review   Personal Goals Review Weight Management/Obesity    Review DouMarden Noble feeling like he has more strength and can walk better since starting rehab.  He reports taking all medications as directed.    Expected Outcomes Short; continue to attend consistently Long: build overall strength             ITP Comments:  ITP Comments     Row Name 08/22/20 1443 09/12/20 1249 09/14/20 1000 09/21/20 0705 10/19/20 0633   ITP Comments Initial telephone orientation completed. [facility] EP orientation scheduled for Monday 5/23 at 10:30am. Completed 6MWT and gym orientation. Initial ITP created and sent for review to Dr. MarEmily Filbertedical Director. First full day of exercise!  Patient was oriented to gym and equipment including functions, settings, policies, and procedures.  Patient's individual exercise prescription and treatment plan were reviewed.  All starting workloads were established based on the results of the 6 minute walk test done at initial orientation visit.  The plan for exercise progression was also introduced and progression will be customized based on patient's performance and goals. 30 Day review completed. Medical Director ITP review done, changes made as directed, and signed approval by Medical Director.   New to program 30 Day review  completed. Medical Director ITP review done, changes made  as directed, and signed approval by Market researcher.    [place] Name 11/16/20 0701 11/23/20 1122         ITP Comments 30 Day review completed. Medical Director ITP review done, changes made as directed, and signed approval by Medical Director. Jag graduated today from  rehab with 36 sessions completed.  Details of the patient's exercise prescription and what He needs to do in order to continue the prescription and progress were discussed with patient.  Patient was given a copy of prescription and goals.  Patient verbalized understanding.  Sami plans to continue exercising by going to a community facility.               Comments: discharge ITP

## 2020-11-23 NOTE — Progress Notes (Signed)
Daily Session Note  Patient Details  Name: Dalton Roy MRN: [id number] Date of Birth: 1941/02/21 Referring Provider:   Flowsheet Row Cardiac Rehab from 09/12/2020 in [facility] Cardiac and Pulmonary Rehab  Referring Provider Tammy Sours ([place])       Encounter Date: 11/23/2020  Check In:  Session Check In - 11/23/20 1120       Check-In   Supervising physician immediately available to respond to emergencies See telemetry face sheet for immediately available ER MD    Location [facility]-Cardiac & Pulmonary Rehab    Staff Present Birdie Sons, MPA, RN;Laureen Owens Shark, BS, RRT, CPFT;Joseph Kris Hartmann [place], RDN, LDN    Virtual Visit No    Medication changes reported     No    Fall or balance concerns reported    No    Warm-up and Cool-down Performed on first and last piece of equipment    Resistance Training Performed Yes    VAD Patient? No    PAD/SET Patient? No      Pain Assessment   Currently in Pain? No/denies                Social History   Tobacco Use  Smoking Status Former   Types: Cigarettes   Quit date: 04/09/1966   Years since quitting: 22.6  Smokeless Tobacco Former    Goals Met:  Independence with exercise equipment Exercise tolerated well No report of cardiac concerns or symptoms Strength training completed today  Goals Unmet:  Not Applicable  Comments:  Dalton Roy graduated today from  rehab with 36 sessions completed.  Details of the patient's exercise prescription and what He needs to do in order to continue the prescription and progress were discussed with patient.  Patient was given a copy of prescription and goals.  Patient verbalized understanding.  Dalton Roy plans to continue exercising by going to a community facility.    Dr. Emily Filbert is Medical Director for [place].  Dr. Ottie Glazier is Medical Director for [facility] Pulmonary Rehabilitation.

## 2021-02-24 ENCOUNTER — Other Ambulatory Visit: Payer: Self-pay | Admitting: General Surgery

## 2021-02-24 NOTE — Progress Notes (Signed)
Subjective:     Patient ID: Dalton Roy is a 80 y.o. male.   HPI   The following portions of the patient's history were reviewed and updated as appropriate.   This a new patient is here today for: office visit. Here to discuss having a screening colonoscopy, last completed in 2009.   His bowels move daily, no bleeding. He is here with his wife, Hassan Rowan.   Review of Systems  Constitutional: Negative for chills and fever.  Respiratory: Negative for cough.          Chief Complaint  Patient presents with   Pre-op Exam      BP (!) 144/97   Pulse 62   Temp 36 C (96.8 F)   Ht 177.8 cm (5' 10" )   Wt 87.1 kg (192 lb)   BMI 27.55 kg/m        Past Medical History:  Diagnosis Date   Bipolar 1 disorder (CMS-HCC)     Gout     H/O idiopathic thrombocytopenic purpura     H/O: psoriasis     Hypertension     Hypothyroidism     Migraine     Murmur, cardiac, unspecified             Past Surgical History:  Procedure Laterality Date   CHOLECYSTECTOMY       COLONOSCOPY   08/31/2014    Dr Bary Castilla   COLONOSCOPY   09/12/2007    Dr Bary Castilla   HEMORRHOIDECTOMY EXTERNAL   2001   history of CABG x3       LITHOTRIPSY              Social History           Socioeconomic History   Marital status: Married  Tobacco Use   Smoking status: Former      Types: Cigarettes      Quit date: 04/09/1966      Years since quitting: 54.9   Smokeless tobacco: Former  Substance and Sexual Activity   Alcohol use: Never   Drug use: Never             Allergies  Allergen Reactions   Codeine Other (See Comments)      hyperactive        Current Medications        Current Outpatient Medications  Medication Sig Dispense Refill   acetaminophen (TYLENOL) 325 MG tablet Take by mouth as needed       apixaban (ELIQUIS) 5 mg tablet 2 (two) times daily       ascorbic acid, vitamin C, (VITAMIN C) 500 MG tablet Take 1 tablet (500 mg total) by mouth once daily       aspirin 81 MG EC tablet once  daily       atorvastatin (LIPITOR) 40 MG tablet Take 1 tablet (40 mg total) by mouth at bedtime       bisacodyL (DULCOLAX) 10 mg suppository Place rectally as needed       capsaicin (ZOSTRIX) 0.025 % cream Apply topically as needed       ferrous sulfate 325 (65 FE) MG tablet once daily       gabapentin (NEURONTIN) 300 MG capsule 2 (two) times daily       levothyroxine (SYNTHROID) 25 MCG tablet once daily       levothyroxine (SYNTHROID) 50 MCG tablet once daily       melatonin 5 mg Tab Take by mouth as needed  metoprolol tartrate (LOPRESSOR) 25 MG tablet 0.5 tablets (12.5 mg total) once daily       mirtazapine (REMERON) 30 MG tablet at bedtime        No current facility-administered medications for this visit.             Family History  Problem Relation Age of Onset   Cancer Father          possible bladder   Colon cancer Brother          half brother        Labs and Radiology:    No 2022 labs available.   December 11, 2019 laboratory:   Component Ref Range & Units 1 yr ago   WBC (White Blood Cell Count) 3.2 - 9.8 x10^9/L 6.0   Hemoglobin 13.7 - 17.3 g/dL 11.6 Low    Hematocrit 39.0 - 49.0 % 36.0 Low    Platelets 150 - 450 x10^9/L 205   MCV (Mean Corpuscular Volume) 80 - 98 fL 96   MCH (Mean Corpuscular Hemoglobin) 26.5 - 34.0 pg 31.0   MCHC (Mean Corpuscular Hemoglobin Concentration) 31.5 - 36.3 % 32.2   RBC (Red Blood Cell Count) 4.37 - 5.74 x10^12/L 3.74 Low    RDW-CV (Red Cell Distribution Width) 11.5 - 14.5 % 14.5   NRBC (Nucleated Red Blood Cell Count) 0 x10^9/L 0.00   NRBC % (Nucleated Red Blood Cell %) % 0.0   MPV (Mean Platelet Volume) 7.2 - 11.7 fL 10.2   Neutrophil Count 2.0 - 8.6 x10^9/L 3.8   Neutrophil % 37 - 80 % 62.3   Lymphocyte Count 0.6 - 4.2 x10^9/L 1.3   Lymphocyte % 10 - 50 % 20.8   Monocyte Count 0 - 0.9 x10^9/L 0.5   Monocyte % 0 - 12 % 8.7   Eosinophil Count 0 - 0.70 x10^9/L 0.43   Eosinophil % 0 - 7 % 7.2 High    Basophil Count 0 -  0.20 x10^9/L 0.05   Basophil % 0 - 2 % 0.8   Immature Granulocyte Count <=0.06 x10^9/L 0.01   Immature Granulocyte % <=0.7 % 0.2     Component Ref Range & Units 1 yr ago   Sodium 135 - 145 mmol/L 135   Potassium 3.5 - 5.0 mmol/L 4.3   Chloride 98 - 108 mmol/L 101   Carbon Dioxide (CO2) 21 - 30 mmol/L 26   Urea Nitrogen (BUN) 7 - 20 mg/dL 21 High    Creatinine 0.6 - 1.3 mg/dL 1.4 High    Glucose 70 - 140 mg/dL 81   Comment: Interpretive Data:  Above is the NONFASTING reference range.   Below are the FASTING reference ranges:  NORMAL:      70-99 mg/dL  PREDIABETES: 100-125 mg/dL  DIABETES:    > 125 mg/dL   Calcium 8.7 - 10.2 mg/dL 9.1   AST (Aspartate Aminotransferase) 15 - 41 U/L 23   ALT (Alanine Aminotransferase) 17 - 63 U/L <5 Low    Bilirubin, Total 0.4 - 1.5 mg/dL 0.6   Alk Phos (Alkaline Phosphatase) 24 - 110 U/L 83   Albumin 3.5 - 4.8 g/dL 3.2 Low    Protein, Total 6.2 - 8.1 g/dL 7.4   Anion Gap 3 - 12 mmol/L 8   BUN/CREA Ratio 6 - 27 15   Glomerular Filtration Rate (eGFR)  mL/min/1.73sq m 48           Colonoscopy in 2009: Tubular adenoma.   Colonoscopy 2016: Normal exam.  Objective:   Physical Exam Constitutional:      Appearance: Normal appearance.  Cardiovascular:     Rate and Rhythm: Normal rate and regular rhythm.     Pulses: Normal pulses.     Heart sounds: Normal heart sounds.     Comments: Clear aortic valve exam.  No murmur evident. Pulmonary:     Effort: Pulmonary effort is normal.     Breath sounds: Normal breath sounds.  Musculoskeletal:     Cervical back: Neck supple.  Skin:    General: Skin is warm and dry.  Neurological:     Mental Status: He is alert and oriented to person, place, and time.  Psychiatric:        Mood and Affect: Mood normal.        Behavior: Behavior normal.           Assessment:     Family history colon cancer in a first-degree relative.   Past history colon polyp.    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  He is walking 1 to 1.5 miles per day, slightly below what he was doing prior to surgery.   While he required dialysis after his bypass an episode of sepsis secondary to dental caries he is now with relatively stable renal function based on 2021 labs.   With a valve in the aortic position there should be no difficulty discontinuing his Eliquis for 2 days, especially as appears to be in sinus rhythm at this time.   We will arrange for the patient to have laboratory studies completed locally prior to procedure if were not able to get access to 2022 labs from the [place].  (Not in available in [place] today).   More than 30 minutes was spent during record review to confirm patient's safety for the procedure.      This note is partially prepared by Karie Fetch, RN, acting as a scribe in the presence of Dr. Hervey Ard, MD.  The documentation recorded by the scribe accurately reflects the service I personally performed and the decisions made by me.    Robert Bellow, MD FACS

## 2021-03-14 ENCOUNTER — Encounter: Payer: Self-pay | Admitting: General Surgery

## 2021-03-15 ENCOUNTER — Encounter
Admission: RE | Disposition: A | Discharge status: Home / Self Care | Payer: Self-pay | Source: Home / Self Care | Attending: General Surgery

## 2021-03-15 ENCOUNTER — Ambulatory Visit
Admission: RE | Admit: 2021-03-15 | Discharge: 2021-03-15 | Disposition: A | Discharge status: Home / Self Care | Payer: No Typology Code available for payment source | Attending: General Surgery | Admitting: General Surgery

## 2021-03-15 ENCOUNTER — Encounter: Payer: Self-pay | Admitting: General Surgery

## 2021-03-15 ENCOUNTER — Ambulatory Visit: Payer: No Typology Code available for payment source | Admitting: Certified Registered"

## 2021-03-15 DIAGNOSIS — Z87891 Personal history of nicotine dependence: Secondary | ICD-10-CM | POA: Insufficient documentation

## 2021-03-15 DIAGNOSIS — Z7901 Long term (current) use of anticoagulants: Secondary | ICD-10-CM | POA: Insufficient documentation

## 2021-03-15 DIAGNOSIS — Z800 Family history of malignant neoplasm of digestive organs: Secondary | ICD-10-CM | POA: Diagnosis not present

## 2021-03-15 DIAGNOSIS — Z1211 Encounter for screening for malignant neoplasm of colon: Secondary | ICD-10-CM | POA: Diagnosis present

## 2021-03-15 DIAGNOSIS — I10 Essential (primary) hypertension: Secondary | ICD-10-CM | POA: Insufficient documentation

## 2021-03-15 HISTORY — PX: COLONOSCOPY WITH PROPOFOL: SHX5780

## 2021-03-15 SURGERY — COLONOSCOPY WITH PROPOFOL
Anesthesia: General

## 2021-03-15 MED ORDER — PROPOFOL 10 MG/ML IV BOLUS
INTRAVENOUS | Status: AC
  Filled 2021-03-15: qty 20

## 2021-03-15 MED ORDER — SODIUM CHLORIDE 0.9 % IV SOLN: INTRAVENOUS | Status: DC

## 2021-03-15 MED ORDER — PROPOFOL 500 MG/50ML IV EMUL
INTRAVENOUS | Status: DC | PRN
  Administered 2021-03-15: 120 ug/kg/min via INTRAVENOUS

## 2021-03-15 MED ORDER — LIDOCAINE 2% (20 MG/ML) 5 ML SYRINGE
INTRAMUSCULAR | Status: DC | PRN
  Administered 2021-03-15: 20 mg via INTRAVENOUS

## 2021-03-15 MED ORDER — PROPOFOL 10 MG/ML IV BOLUS
INTRAVENOUS | Status: DC | PRN
  Administered 2021-03-15: 70 mg via INTRAVENOUS
  Administered 2021-03-15: 30 mg via INTRAVENOUS

## 2021-03-15 MED ORDER — PROPOFOL 500 MG/50ML IV EMUL
INTRAVENOUS | Status: AC
  Filled 2021-03-15: qty 50

## 2021-03-15 MED ORDER — EPHEDRINE 5 MG/ML INJ
INTRAVENOUS | Status: AC
  Filled 2021-03-15: qty 5

## 2021-03-15 NOTE — Anesthesia Postprocedure Evaluation (Signed)
Anesthesia Post Note  Patient: Dalton Roy  Procedure(s) Performed: COLONOSCOPY WITH PROPOFOL  Patient location during evaluation: Endoscopy Anesthesia Type: General Level of consciousness: awake and alert Pain management: pain level controlled Vital Signs Assessment: post-procedure vital signs reviewed and stable Respiratory status: spontaneous breathing, nonlabored ventilation, respiratory function stable and patient connected to nasal cannula oxygen Cardiovascular status: blood pressure returned to baseline and stable Postop Assessment: no apparent nausea or vomiting Anesthetic complications: no   No notable events documented.   Last Vitals:  Vitals:   03/15/21 0954 03/15/21 1005  BP: 116/79   Pulse:    Resp:    Temp:  (!) 36 C  SpO2:      Last Pain:  Vitals:   03/15/21 1005  TempSrc: Temporal  PainSc: 0-No pain                 Precious Haws Analese Sovine

## 2021-03-15 NOTE — Anesthesia Preprocedure Evaluation (Signed)
Anesthesia Evaluation  Patient identified by MRN, date of birth, ID band Patient awake    Reviewed: Allergy & Precautions, NPO status , Patient's Chart, lab work & pertinent test results  History of Anesthesia Complications (+) PROLONGED EMERGENCE and history of anesthetic complications  Airway Mallampati: II  TM Distance: >3 FB Neck ROM: Full    Dental  (+) Poor Dentition, Chipped   Pulmonary neg shortness of breath, sleep apnea , former smoker,    Pulmonary exam normal        Cardiovascular hypertension, Pt. on medications and Pt. on home beta blockers (-) angina+ CAD  Normal cardiovascular exam+ Valvular Problems/Murmurs (no therapy)      Neuro/Psych  Headaches, PSYCHIATRIC DISORDERS    GI/Hepatic   Endo/Other  Hypothyroidism Hyperthyroidism   Renal/GU Renal disease     Musculoskeletal   Abdominal   Peds  Hematology   Anesthesia Other Findings Past Medical History: No date: Bipolar 1 disorder ([place]) 20 years: Cancer ([place])     Comment:  skin No date: Gout No date: Heart murmur No date: Hypertension No date: Hypothyroidism No date: ITP (idiopathic thrombocytopenic purpura) No date: Migraines No date: Psoriasis   Reproductive/Obstetrics                             Anesthesia Physical  Anesthesia Plan  ASA: 3  Anesthesia Plan: General   Post-op Pain Management:    Induction: Intravenous  PONV Risk Score and Plan: Propofol infusion and TIVA  Airway Management Planned: Nasal Cannula  Additional Equipment:   Intra-op Plan:   Post-operative Plan:   Informed Consent: I have reviewed the patients History and Physical, chart, labs and discussed the procedure including the risks, benefits and alternatives for the proposed anesthesia with the patient or authorized representative who has indicated his/her understanding and acceptance.     Dental Advisory Given  Plan  Discussed with: Anesthesiologist, CRNA and Surgeon  Anesthesia Plan Comments: (Patient consented for risks of anesthesia including but not limited to:  - adverse reactions to medications - risk of airway placement if required - damage to eyes, teeth, lips or other oral mucosa - nerve damage due to positioning  - sore throat or hoarseness - Damage to heart, brain, nerves, lungs, other parts of body or loss of life  Patient voiced understanding.)        Anesthesia Quick Evaluation

## 2021-03-15 NOTE — Op Note (Addendum)
[facility]astroenterology Patient Name: Dalton Roy Procedure Date: 03/15/2021 9:13 AM MRN: [id number] Account #: [id number] Date of Birth: 12-30-1940 Admit Type: Outpatient Age: 80 Room: [facility] ENDO ROOM 1 Gender: Male Note Status: Supervisor Override Instrument Name: Peds Colonoscope [id number] Colonoscope [id number] Procedure:             Colonoscopy Indications:           Family history of colon cancer in a first-degree                         relative before age 21 years, Personal history of                         colonic polyps Providers:             Robert Bellow, MD Medicines:             Propofol per Anesthesia Complications:         No immediate complications. Procedure:             Pre-Anesthesia Assessment:                        - Prior to the procedure, a History and Physical was                         performed, and patient medications, allergies and                         sensitivities were reviewed. The patient's tolerance                         of previous anesthesia was reviewed.                        - The risks and benefits of the procedure and the                         sedation options and risks were discussed with the                         patient. All questions were answered and informed                         consent was obtained.                        After obtaining informed consent, the colonoscope was                         passed under direct vision. Throughout the procedure,                         the patient's blood pressure, pulse, and oxygen                         saturations were monitored continuously. The                         Colonoscope was introduced through the anus and  advanced to the the cecum, identified by appendiceal                         orifice and ileocecal valve. The Colonoscope was                         introduced through the anus and advanced to the. The                          colonoscopy was performed without difficulty. The                         patient tolerated the procedure well. The quality of                         the bowel preparation was excellent. Findings:      The entire examined colon appeared normal on direct and retroflexion       views. Impression:            - The entire examined colon is normal on direct and                         retroflexion views.                        - No specimens collected. Recommendation:        - Discharge patient to home (via wheelchair). Procedure Code(s):     --- Professional ---                        [phone], Colonoscopy, flexible; diagnostic, including                         collection of specimen(s) by brushing or washing, when                         performed (separate procedure) Diagnosis Code(s):     --- Professional ---                        Z80.0, Family history of malignant neoplasm of                         digestive organs CPT copyright 2019 American Medical Association. All rights reserved. The codes documented in this report are preliminary and upon coder review may  be revised to meet current compliance requirements. Robert Bellow, MD 03/15/2021 9:42:42 AM This report has been signed electronically. Number of Addenda: 0 Note Initiated On: 03/15/2021 9:13 AM Scope Withdrawal Time: 0 hours 9 minutes 13 seconds  Total Procedure Duration: 0 hours 15 minutes 17 seconds  Estimated Blood Loss:  Estimated blood loss: none.      [facility]

## 2021-03-15 NOTE — Transfer of Care (Signed)
Immediate Anesthesia Transfer of Care Note  Patient: Dalton Roy  Procedure(s) Performed: COLONOSCOPY WITH PROPOFOL  Patient Location: Endoscopy Unit  Anesthesia Type:General  Level of Consciousness: drowsy  Airway & Oxygen Therapy: Patient Spontanous Breathing  Post-op Assessment: Report given to RN and Post -op Vital signs reviewed and stable  Post vital signs: Reviewed  Last Vitals:  Vitals Value Taken Time  BP    Temp    Pulse    Resp    SpO2      Last Pain:  Vitals:   03/15/21 0943  TempSrc:   PainSc: 0-No pain         Complications: No notable events documented.

## 2021-03-15 NOTE — H&P (Signed)
Dalton Roy [id number] June 22, 1940     HPI:  80 y/o with personal history of colon polyp in 2011 and family history of colon cancer in a first degree relative.  Off Eliquis x 4 doses.  Tolerated prep well.   Creatinine 2.1.  In September 2021: 1.4.   Medications Prior to Admission  Medication Sig Dispense Refill Last Dose   aspirin EC 81 MG tablet Take 81 mg by mouth daily with lunch.   03/14/2021   atorvastatin (LIPITOR) 40 MG tablet Take 20 mg by mouth See admin instructions. Take 20 mg at bedtime for 20 days, then take 40 mg at bedtime.   03/14/2021   gabapentin (NEURONTIN) 300 MG capsule Take 300 mg by mouth at bedtime.   03/14/2021   levothyroxine (SYNTHROID, LEVOTHROID) 75 MCG tablet Take 1 tablet (75 mcg total) by mouth daily before breakfast. (Patient taking differently: Take 75 mcg by mouth daily.) 90 tablet 3 03/14/2021   metoprolol tartrate (LOPRESSOR) 25 MG tablet Take 12.5 mg by mouth every 12 (twelve) hours.   03/14/2021   acetaminophen (TYLENOL) 325 MG tablet Take 975 mg by mouth every 8 (eight) hours as needed.      allopurinol (ZYLOPRIM) 300 MG tablet TAKE 1 TABLET(300 MG) BY MOUTH DAILY (Patient taking differently: Take 100 mg by mouth 3 (three) times a week. Every Monday, Wednesday and Friday.) 90 tablet 1    apixaban (ELIQUIS) 5 MG TABS tablet Take 5 mg by mouth every 12 (twelve) hours.   03/13/2021   bisacodyl (DULCOLAX) 10 MG suppository Place 10 mg rectally daily as needed for moderate constipation.      capsaicin (ZOSTRIX) 0.025 % cream Apply 1 application topically every 12 (twelve) hours as needed (pain below right scapula).      doxycycline (VIBRA-TABS) 100 MG tablet Take 1 tablet (100 mg total) by mouth 2 (two) times daily. 20 tablet 0    ferrous sulfate 325 (65 FE) MG tablet Take 325 mg by mouth at bedtime.      hydrocortisone 2.5 % cream Apply 1 application topically 3 (three) times daily as needed (hemorrhoidal itching).      melatonin 5 MG TABS Take 5 mg by mouth at  bedtime as needed (sleep).      mirtazapine (REMERON) 30 MG tablet Take 30 mg by mouth at bedtime.      sodium bicarbonate 650 MG tablet Take 650 mg by mouth 4 (four) times daily.      white petrolatum ointment Apply 1 application topically in the morning and at bedtime.      Allergies  Allergen Reactions   Codeine Other (See Comments)    hyperactive   Past Medical History:  Diagnosis Date   Bipolar 1 disorder ([place])    Cancer ([place]) 20 years   skin   Gout    Heart murmur    Hypertension    Hypothyroidism    ITP (idiopathic thrombocytopenic purpura)    Migraines    Psoriasis    Past Surgical History:  Procedure Laterality Date   CARDIAC SURGERY     CHOLECYSTECTOMY     COLONOSCOPY N/A 08/31/2014   Procedure: COLONOSCOPY;  Surgeon: Robert Bellow, MD;  Location: [facility] ENDOSCOPY;  Service: Endoscopy;  Laterality: N/A;   COLONOSCOPY W/ POLYPECTOMY  09/12/2007   Tubular adenoma from the proximal transverse colon without atypia.   [place]   Social History   Socioeconomic  History   Marital status: Married    Spouse name: Not on file   Number of children: Not on file   Years of education: Not on file   Highest education level: Not on file  Occupational History   Not on file  Tobacco Use   Smoking status: Former    Types: Cigarettes    Quit date: 04/09/1966    Years since quitting: 54.9   Smokeless tobacco: Former  Substance and Sexual Activity   Alcohol use: Not Currently    Alcohol/week: 0.0 standard drinks   Drug use: No   Sexual activity: Not on file  Other Topics Concern   Not on file  Social History Narrative   Married.   [place] veteran.   Moved back to [place] from [place].    Social Determinants of Health   Financial Resource Strain: Not on file  Food Insecurity: Not on file  Transportation Needs: Not on file  Physical Activity: Not on file  Stress: Not on file  Social Connections: Not on file   Intimate Partner Violence: Not on file   Social History   Social History Narrative   Married.   [place] veteran.   Moved back to  from [place].      ROS: Negative.     PE: HEENT: Negative. Lungs: Clear. Cardio: RR.  Colonoscopy in 2009: Tubular adenoma.   Colonoscopy 2016: Normal exam.  History history of colon cancer in a first degree relative.   Assessment/Plan:  Proceed with planned endoscopy.   Dalton Roy 03/15/2021

## 2021-03-16 ENCOUNTER — Encounter: Payer: Self-pay | Admitting: General Surgery

## 2021-06-02 ENCOUNTER — Ambulatory Visit (INDEPENDENT_AMBULATORY_CARE_PROVIDER_SITE_OTHER): Payer: Medicare Other | Admitting: Podiatry

## 2021-06-02 ENCOUNTER — Other Ambulatory Visit: Payer: Self-pay

## 2021-06-02 ENCOUNTER — Encounter: Payer: Self-pay | Admitting: Podiatry

## 2021-06-02 ENCOUNTER — Other Ambulatory Visit: Payer: Self-pay | Admitting: Podiatry

## 2021-06-02 DIAGNOSIS — L03031 Cellulitis of right toe: Secondary | ICD-10-CM | POA: Diagnosis not present

## 2021-06-02 DIAGNOSIS — L600 Ingrowing nail: Secondary | ICD-10-CM

## 2021-06-02 MED ORDER — GENTAMICIN SULFATE 0.1 % EX CREA: 1.0000 "application " | TOPICAL_CREAM | Freq: Two times a day (BID) | CUTANEOUS | 1 refills | Status: AC

## 2021-06-02 MED ORDER — DOXYCYCLINE HYCLATE 100 MG PO TABS: 100.0000 mg | ORAL_TABLET | Freq: Two times a day (BID) | ORAL | 0 refills | Status: AC

## 2021-06-02 MED ORDER — DOXYCYCLINE HYCLATE 100 MG PO TABS: 100.0000 mg | ORAL_TABLET | Freq: Two times a day (BID) | ORAL | 0 refills | Status: DC

## 2021-06-02 MED ORDER — GENTAMICIN SULFATE 0.1 % EX CREA: 1.0000 "application " | TOPICAL_CREAM | Freq: Two times a day (BID) | CUTANEOUS | 1 refills | Status: DC

## 2021-06-02 NOTE — Progress Notes (Signed)
Resent medications to updated pharmacy.

## 2021-06-06 ENCOUNTER — Ambulatory Visit: Payer: Medicare Other | Admitting: Podiatry

## 2021-06-08 ENCOUNTER — Telehealth: Payer: Self-pay | Admitting: *Deleted

## 2021-06-08 NOTE — Telephone Encounter (Signed)
"  I looked in his MyChart and the visit information for the visit was not in there.  Usually when I go to MyChart for any other appointment that he has, I can see the information there.  I need the information so I can send it to the [place].  [facility] but this visit was an emergency visit.  So we filed it with Medicare."  I'll see if Dr. Amalia Hailey can release the information to you in MyChart.  "I would appreciate it." ?

## 2021-06-12 NOTE — Progress Notes (Signed)
° °  HPI: 81 y.o. male presenting today for new complaint regarding increased pain and tenderness associated to the right hallux nail plate with possible infection.  Patient states that for the last 5 days he has had increased pain and tenderness to the right great toe.  He says that now it is very red and swelling with bleeding at the base of the nail.  He denies a history of injury.  He has been applying peroxide and methacrylate with minimal improvement.  He presents for further treatment and evaluation  Past Medical History:  Diagnosis Date   Bipolar 1 disorder ([place])    Cancer ([place]) 20 years   skin   Gout    Heart murmur    Hypertension    Hypothyroidism    ITP (idiopathic thrombocytopenic purpura)    Migraines    Psoriasis     Past Surgical History:  Procedure Laterality Date   CARDIAC SURGERY     CHOLECYSTECTOMY     COLONOSCOPY N/A 08/31/2014   Procedure: COLONOSCOPY;  Surgeon: Robert Bellow, MD;  Location: [facility] ENDOSCOPY;  Service: Endoscopy;  Laterality: N/A;   COLONOSCOPY W/ POLYPECTOMY  09/12/2007   Tubular adenoma from the proximal transverse colon without atypia.   COLONOSCOPY WITH PROPOFOL N/A 03/15/2021   Procedure: COLONOSCOPY WITH PROPOFOL;  Surgeon: Robert Bellow, MD;  Location: [facility] ENDOSCOPY;  Service: Endoscopy;  Laterality: N/A;   HEMORRHOID SURGERY  2001   LITHOTRIPSY     MELANOMA EXCISION  1998    Allergies  Allergen Reactions   Codeine Other (See Comments)    hyperactive     Physical Exam: General: The patient is alert and oriented x3 in no acute distress.  Dermatology: Hyperkeratotic dystrophic nail noted to the right hallux nail plate which is loosely adhered with a significant amount of subungual debris.  There is some slight erythema and edema around the nail plate.  With associated tenderness to touch  Vascular: Palpable pedal pulses bilaterally. Capillary refill within normal limits.  Slight edema with erythema localized around the nail  plate right hallux  Neurological: Light touch and protective threshold grossly intact  Musculoskeletal Exam: No pedal deformities noted   Assessment: 1.  Ingrowing toenail with subungual infection right hallux nail plate   Plan of Care:  1. Patient evaluated.   2.  After evaluating the patient decision was made to perform a total temporary nail avulsion to the right hallux nail plate.  Patient agrees 3.  The toe was prepped in aseptic manner and digital block performed using 3 mL 2% lidocaine plain.  The nail was avulsed in its entirety and cleansed with a light dressing applied 4.  Post care instructions were provided both verbal and written 5.  Prescription for doxycycline 100 mg 2 times daily #20 6.  Prescription for gentamicin cream apply 2 times daily 7.  Return to clinic in 2 weeks  *Goes by Herbert Deaner, DPM [facility]  Dr. Edrick Kins, DPM    [address], [place] [address]                Office [phone]  Fax [phone]

## 2021-06-16 ENCOUNTER — Other Ambulatory Visit: Payer: Self-pay

## 2021-06-16 ENCOUNTER — Encounter: Payer: Self-pay | Admitting: Podiatry

## 2021-06-16 ENCOUNTER — Ambulatory Visit (INDEPENDENT_AMBULATORY_CARE_PROVIDER_SITE_OTHER): Payer: Medicare Other | Admitting: Podiatry

## 2021-06-16 DIAGNOSIS — L600 Ingrowing nail: Secondary | ICD-10-CM | POA: Diagnosis not present

## 2021-06-16 NOTE — Progress Notes (Signed)
? ?  HPI: 81 y.o. male presenting today for follow-up evaluation after total temporary nail avulsion to the right hallux nail plate on 08/25/8414 secondary to infection.  Patient states that he is doing well.  He has been soaking his foot and applying the antibiotic cream as instructed.  No new complaints at this time ? ?Past Medical History:  ?Diagnosis Date  ? Bipolar 1 disorder ([place])   ? Cancer ([place]) 20 years  ? skin  ? Gout   ? Heart murmur   ? Hypertension   ? Hypothyroidism   ? ITP (idiopathic thrombocytopenic purpura)   ? Migraines   ? Psoriasis   ? ? ?Past Surgical History:  ?Procedure Laterality Date  ? CARDIAC SURGERY    ? CHOLECYSTECTOMY    ? COLONOSCOPY N/A 08/31/2014  ? Procedure: COLONOSCOPY;  Surgeon: Robert Bellow, MD;  Location: [facility] ENDOSCOPY;  Service: Endoscopy;  Laterality: N/A;  ? COLONOSCOPY W/ POLYPECTOMY  09/12/2007  ? Tubular adenoma from the proximal transverse colon without atypia.  ? COLONOSCOPY WITH PROPOFOL N/A 03/15/2021  ? Procedure: COLONOSCOPY WITH PROPOFOL;  Surgeon: Robert Bellow, MD;  Location: [facility] ENDOSCOPY;  Service: Endoscopy;  Laterality: N/A;  ? [place] SURGERY  2001  ? LITHOTRIPSY    ? MELANOMA EXCISION  1998  ? ? ?Allergies  ?Allergen Reactions  ? Codeine Other (See Comments)  ?  hyperactive  ? ?  ?Physical Exam: ?General: The patient is alert and oriented x3 in no acute distress. ? ?Dermatology: Absence of the right hallux nail plate noted with a dry stable underlying nailbed.  No clinical evidence of infection.  No drainage. ? ?Vascular: Palpable pedal pulses bilaterally. Capillary refill within normal limits.  Negative for any significant edema or erythema ? ?Neurological: Light touch and protective threshold grossly intact ? ?Musculoskeletal Exam: No pedal deformities noted ?  ? ?Assessment: ?1.  S/PE total temporary nail avulsion right hallux nail plate with good routine healing ? ? ?Plan of Care:  ?1. Patient evaluated.  ?2.  Light debridement of the  nailbed was performed using a tissue nipper.  Continue gentamicin cream and a Band-Aid daily for an additional week ?3.  Discontinue postop shoe.  Patient may resume good supportive shoes and sneakers. ?4.  Resume full activity no restrictions ?5.  Return to clinic as needed ? ?  ?  ?Edrick Kins, DPM ?[place] ? ?Dr. Edrick Kins, DPM  ?  ?[address]                                        ?[place], [place] [address]                ?Office [phone]  ?Fax [phone] ? ? ? ? ?

## 2021-10-07 IMAGING — US US THORACENTESIS ASP PLEURAL SPACE W/IMG GUIDE
1 series · 3 of 3 positions shown · non-contrast
Comparison: none

INDICATION: Patient history of coronary artery disease status post CABG with
dyspnea found to have a bilateral pleural effusions. Request is for
therapeutic right-sided thoracentesis

[Series 1: us thoracentesis asp pleural s · 3 of 3 slices shown]
[im 1/3]
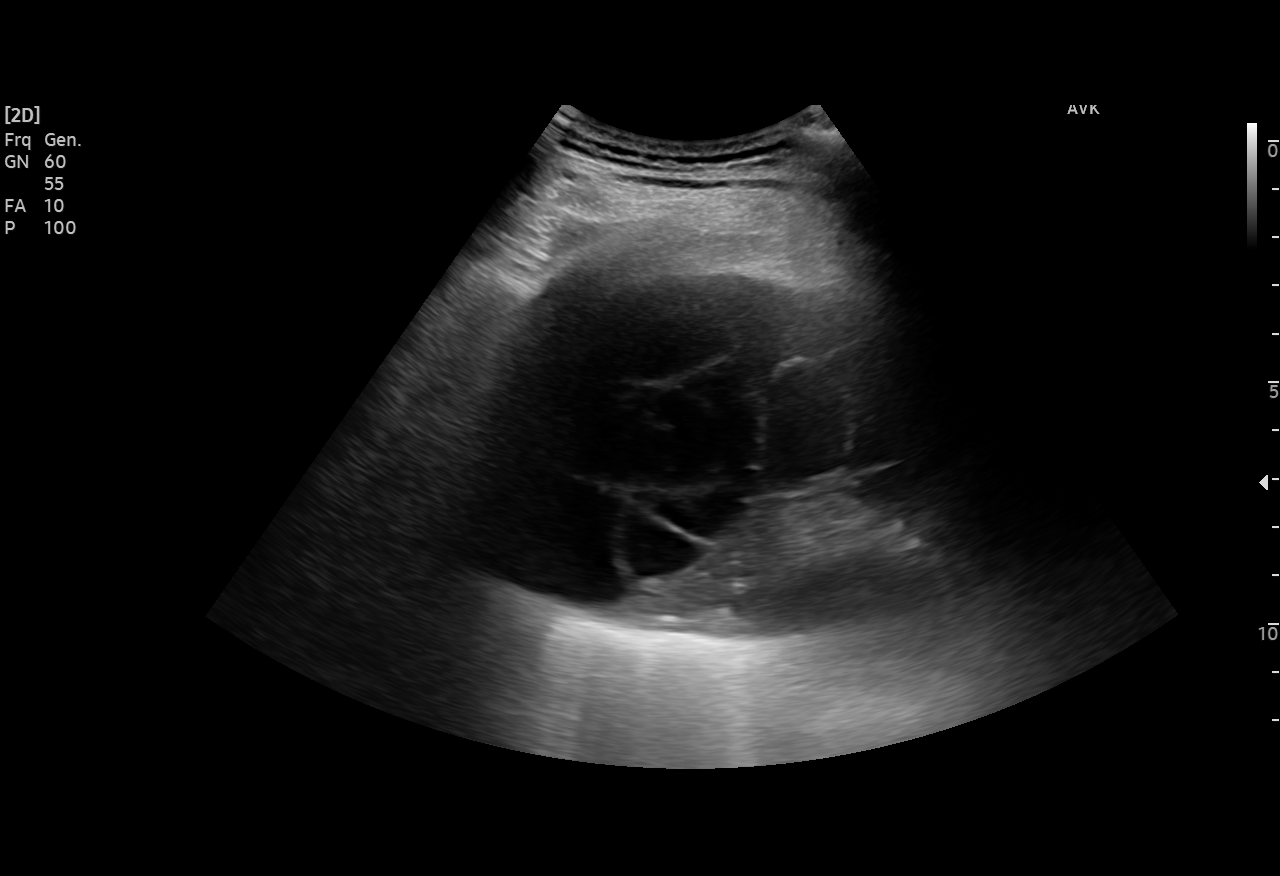
[im 2/3]
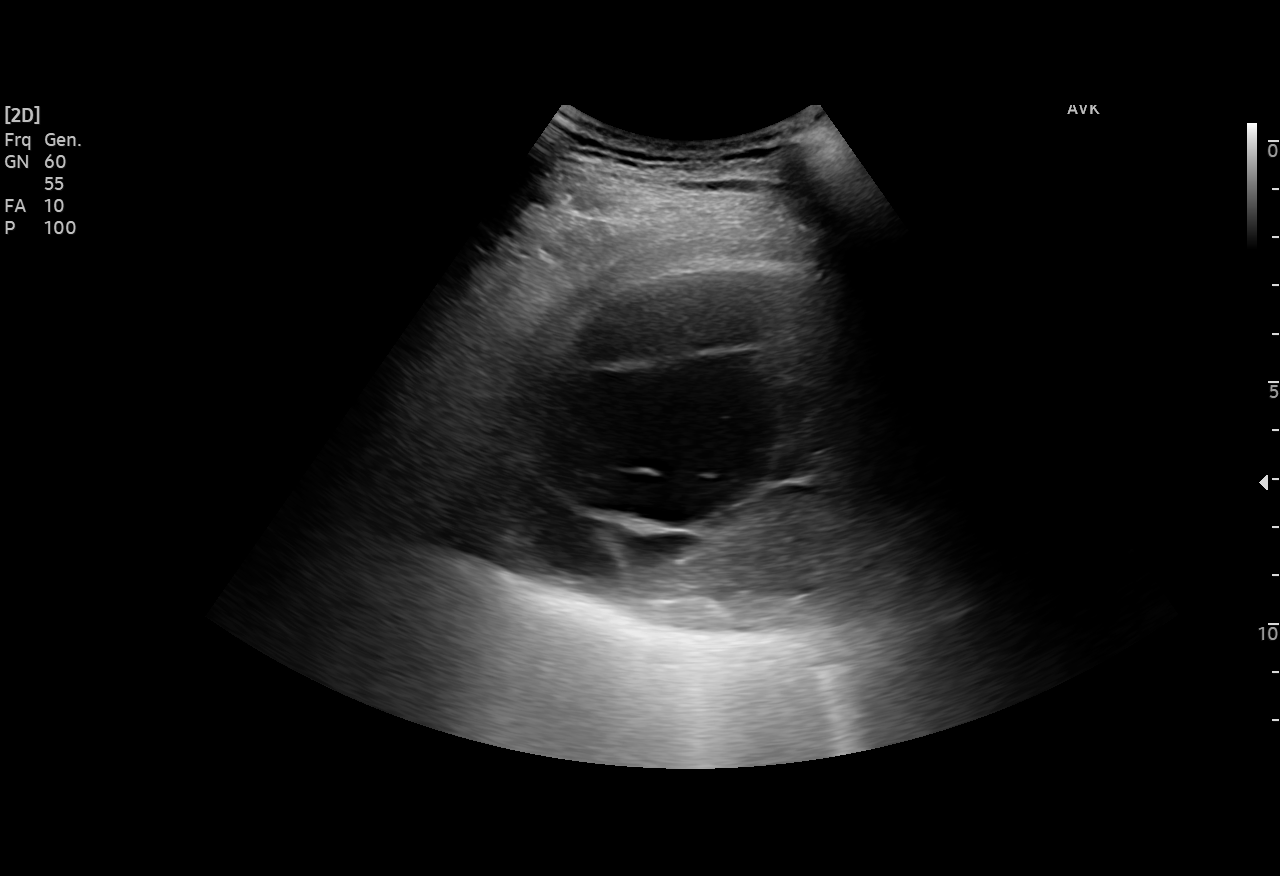
[im 3/3]
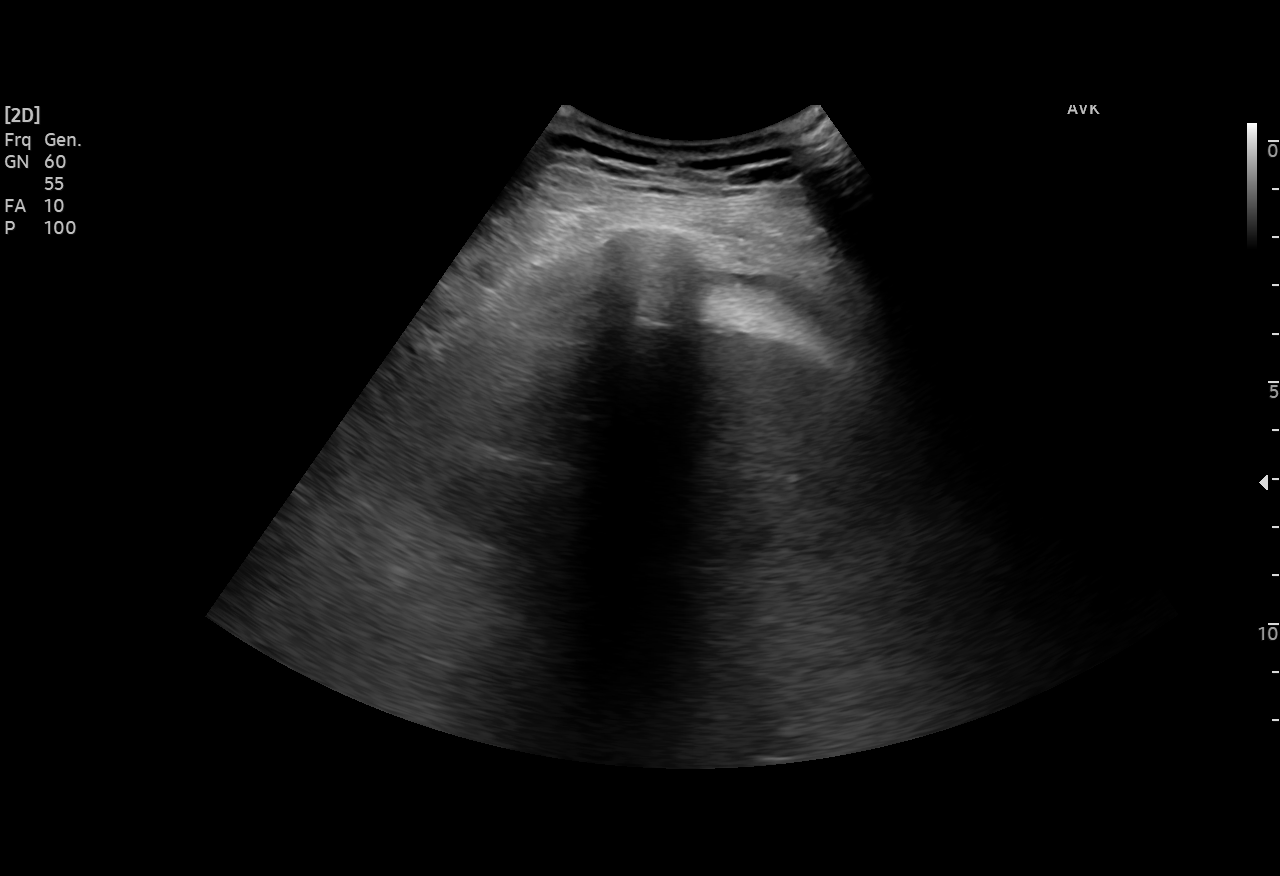

[3 of 3 positions shown; findings below may reference images not displayed]

EXAM:
ULTRASOUND GUIDED THERAPEUTIC THORACENTESIS

MEDICATIONS:
Lidocaine 1% 10 mL

COMPLICATIONS:
None immediate.

PROCEDURE:
An ultrasound guided thoracentesis was thoroughly discussed with the
patient and questions answered. The benefits, risks, alternatives
and complications were also discussed. The patient understands and
wishes to proceed with the procedure. Written consent was obtained.

Ultrasound performed showed a loculated right-sided pleural
effusion. The largest pocket was localized and marked in the right
sided chest. The area was then prepped and draped in the normal
sterile fashion. 1% Lidocaine was used for local anesthesia. Under
ultrasound guidance a 6 Fr Safe-T-Centesis catheter was introduced.
Thoracentesis was performed. The catheter was removed and a dressing
applied.
FINDINGS: A total of approximately 500 mL of sanguineous fluid was removed.
IMPRESSION: Successful ultrasound guided right-sided therapeutic thoracentesis
yielding 500 mL of pleural fluid.

Read by: Paul Junior Balram, NP

## 2021-10-07 IMAGING — CT CT CHEST W/O CM
2 of 3 series · 14 of 36 positions shown, 17 images · non-contrast
Comparison: Portable chest earlier today. Chest radiographs
02/13/2020. Non-contrast CT Abdomen and Pelvis 06/10/2006.

CLINICAL DATA: 79-year-old male status post ultrasound-guided right
side thoracentesis today. Recent 3 vessel CABG, aortic valve
replacement last month at [REDACTED] in [place]. End stage
renal disease.

EXAM:
CT CHEST WITHOUT CONTRAST
TECHNIQUE: Multidetector CT imaging of the chest was performed following the
standard protocol without IV contrast.

[Series 2: thorax · axial · 0.77mm/px · z∈[-449,-151]mm · 11 of 177 slices shown, 14 images]
[im 14/177  mediastinal]
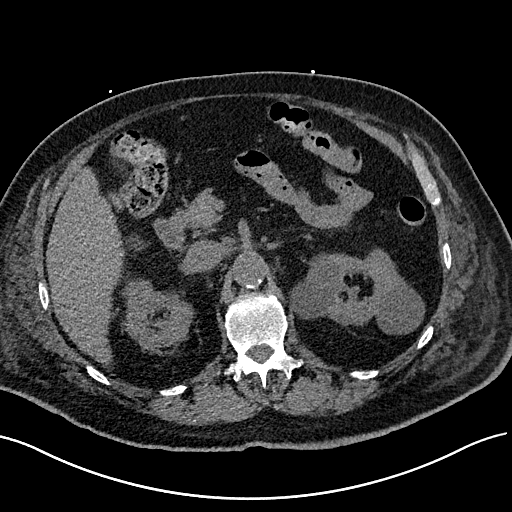
[im 14/177  lung]
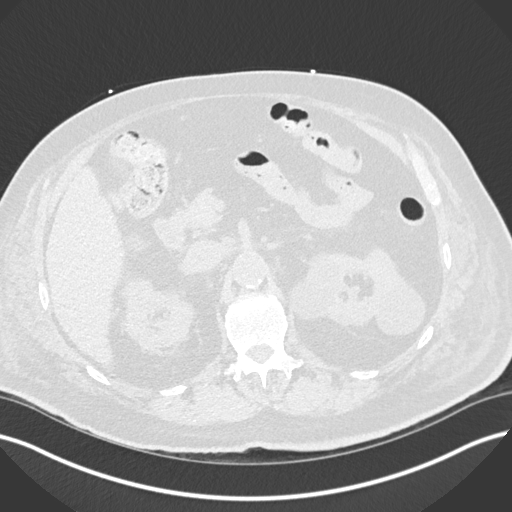
[im 27/177  lung]
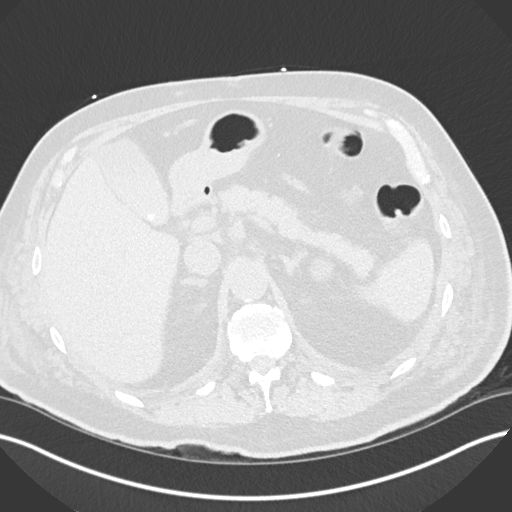
[im 40/177  lung]
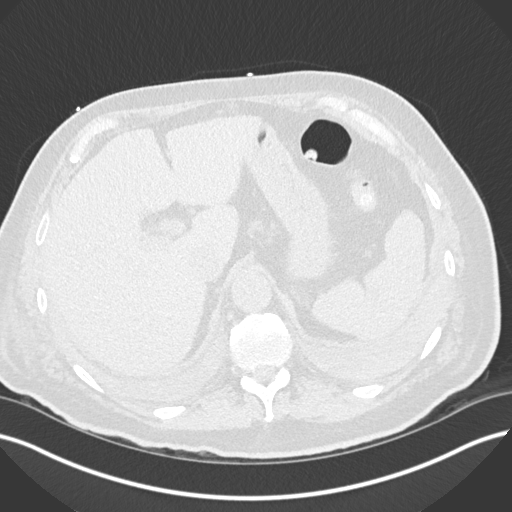
[im 59/177  lung]
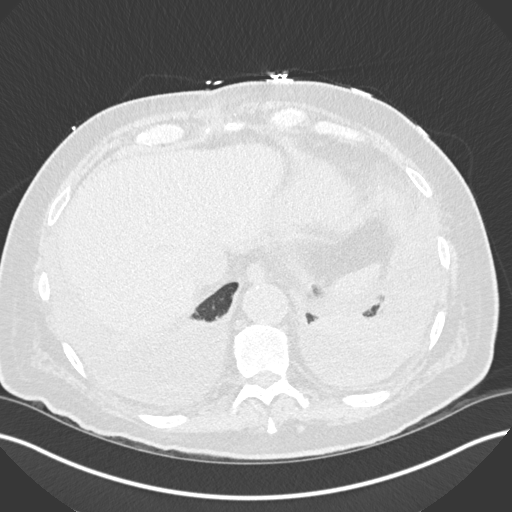
[im 72/177  mediastinal]
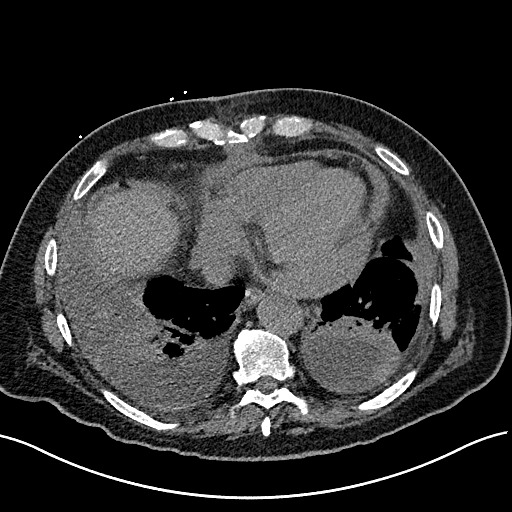
[im 72/177  lung]
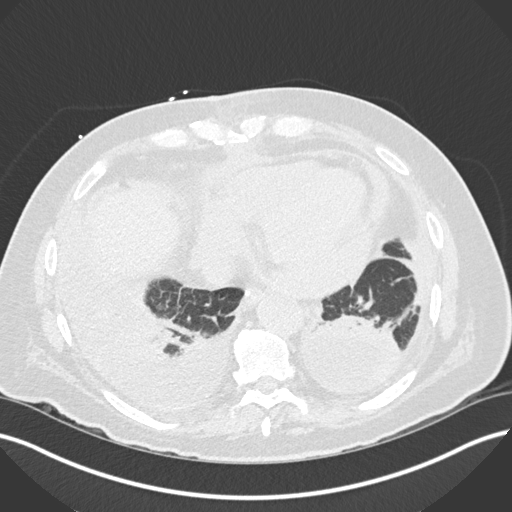
[im 92/177  lung]
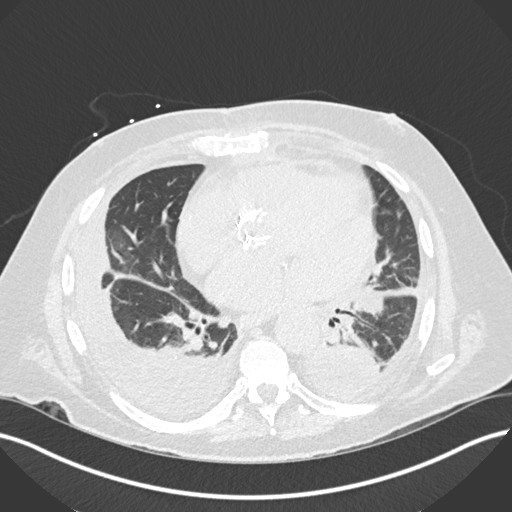
[im 105/177  lung]
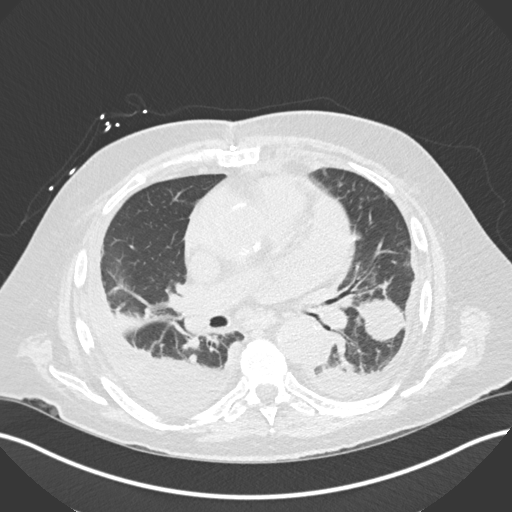
[im 118/177  lung]
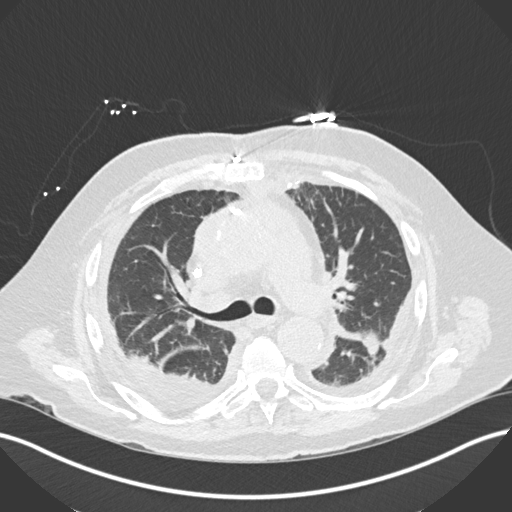
[im 137/177  mediastinal]
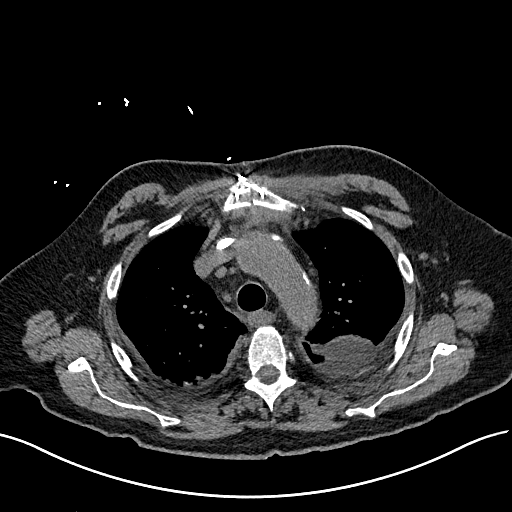
[im 137/177  lung]
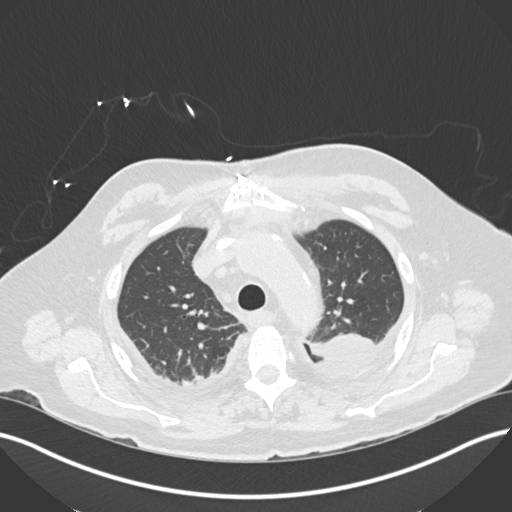
[im 150/177  lung]
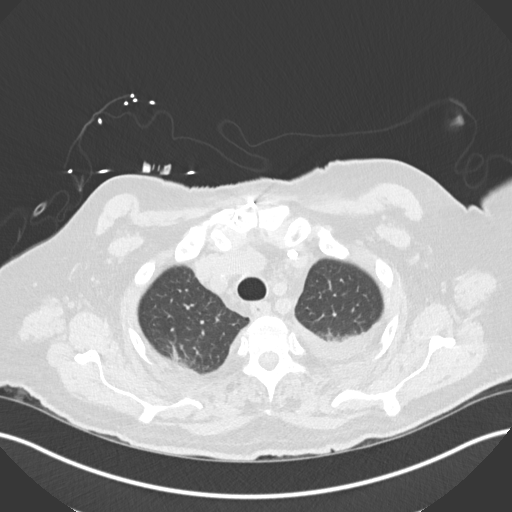
[im 163/177  lung]
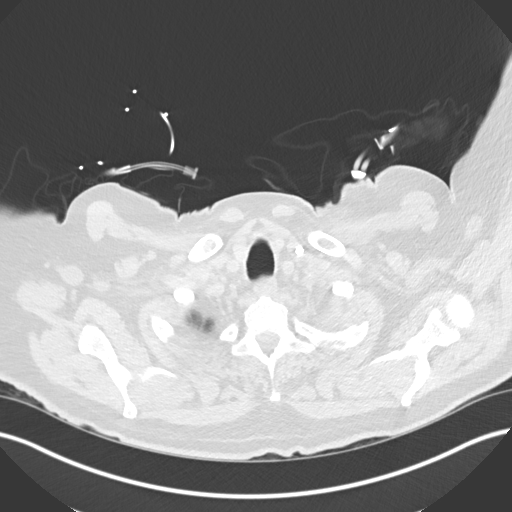

[Series 5: coronal · coronal · 0.71mm/px · 3 of 174 slices shown]
[im 35/174  lung]
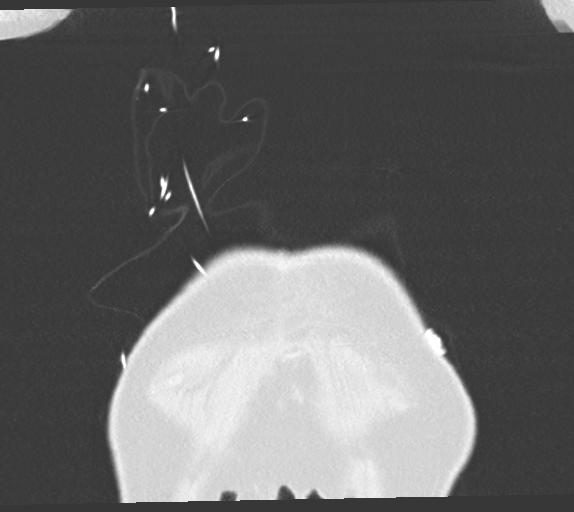
[im 70/174  lung]
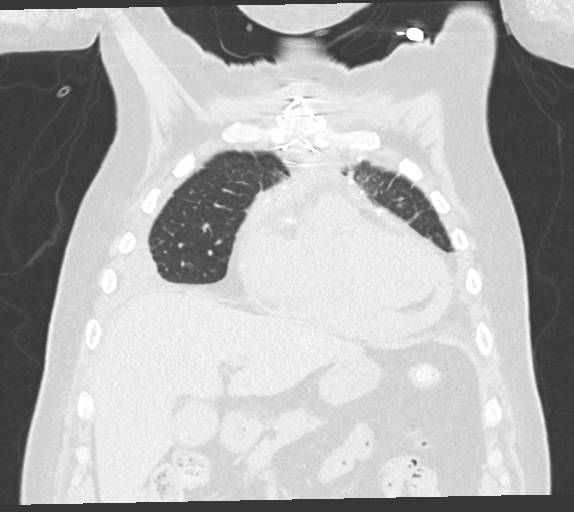
[im 104/174  lung]
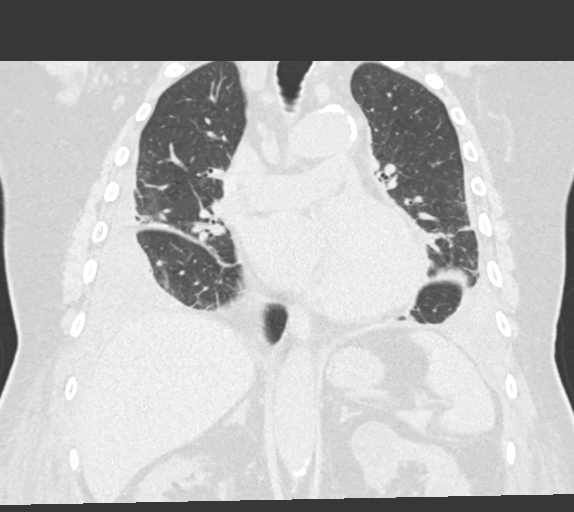

[14 of 36 positions shown; findings below may reference images not displayed]

FINDINGS: Cardiovascular: Cardiomegaly is new since 0330. Small volume
pericardial effusion with complex fluid density (30 Hounsfield
units). Sequelae of CABG and aortic valve replacement.

Mediastinum/Nodes: Recent postoperative changes to the sternum.
Trace postoperative mediastinal hematoma. No lymphadenopathy.

Lungs/Pleura: No pneumothorax.  Major airways are patent.

Small to moderate residual right pleural effusion which is mostly
layering, and averages simple fluid density (series 2, image 80)
although there are some subtle areas of hyperdense foci within the
effusion (series 2, image 122). Superimposed compressive right lung
atelectasis. Calcified right lower lobe granuloma. Noncalcified
right middle lobe lung nodule measuring 6 mm on series 4, image 81
at a level which was not included on the 0330 CT.

More lobulated left pleural effusion which partially tracks within
the left major fissure, averages simple fluid density but has more
areas of hyperdensity at the costophrenic angle than on the right
side (series 2 image 125).

Compressive left lung atelectasis.

Upper Abdomen: Cholelithiasis. No pericholecystic inflammation.
Negative visible noncontrast liver, spleen, adrenal glands and bowel
in the upper abdomen. Dystrophic calcifications of the pancreas are
new since 0330. Chronic polycystic renal disease has not
significantly changed since 0330. The largest bilateral cysts have
simple fluid density. Calcified aortic atherosclerosis.

Musculoskeletal: Unhealed sternotomy. Sternotomy wires appear
intact. Benign bone island suspected in the left lateral 7th rib. No
acute or suspicious osseous lesion identified.
IMPRESSION: 1. Recent CABG and aortic valve replacement with trace postoperative
hematoma in the mediastinum and trace hemopericardium suspected.

2. No pneumothorax following thoracentesis. Bilateral pleural
effusions with predominantly simple fluid density although there are
several nonspecific hyperdense areas which are nonspecific but might
be clotted blood. More of the left pleural effusion tracks along the
major fissure. Bilateral compressive atelectasis.

3. Noncalcified 6 mm right middle lobe lung nodule. Non-contrast
chest CT at 6-12 months is recommended. If the nodule is stable at
time of repeat CT, then future CT at 18-24 months (from today's
scan) is considered optional for low-risk patients, but is
recommended for high-risk patients.
This recommendation follows the consensus statement: Guidelines for
Management of Incidental Pulmonary Nodules Detected on CT Images:

4. Cholelithiasis. Chronic polycystic renal disease. Aortic
Atherosclerosis (K8VO1-0IQ.Q).

## 2021-10-07 IMAGING — DX DG CHEST 1V PORT
1 series · 1 of 1 positions shown · non-contrast
Comparison: PA and lateral chest 02/13/2020.

CLINICAL DATA: Patient status post right thoracentesis today.

EXAM:
PORTABLE CHEST 1 VIEW

[chest ap]
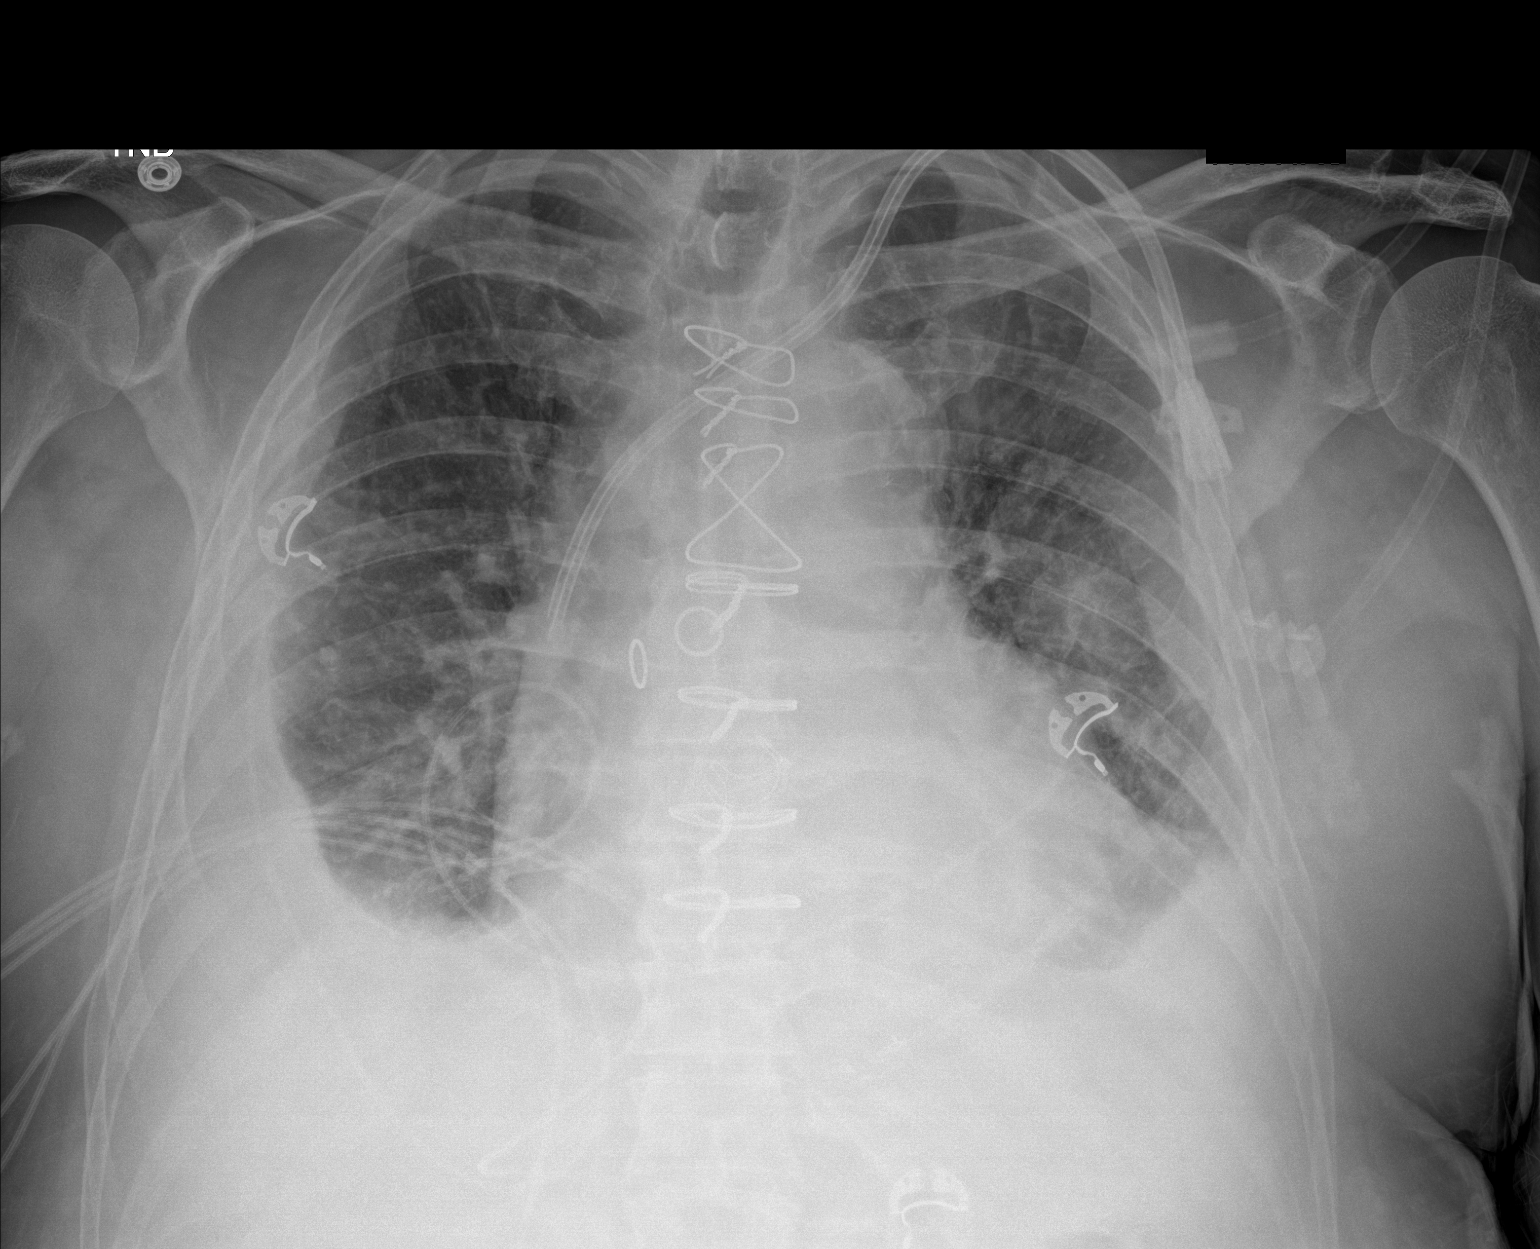

[1 of 1 positions shown; findings below may reference images not displayed]

FINDINGS: Right pleural effusion is decreased after thoracentesis. No
pneumothorax. Small left effusion is unchanged. There is
cardiomegaly and vascular congestion. Aortic atherosclerosis is
noted. The patient is status post CABG and aortic valve replacement.
Dialysis catheter is in place.
IMPRESSION: Decreased right effusion after thoracentesis. Negative for
pneumothorax.

No change in a small left pleural effusion and basilar atelectasis.

Cardiomegaly and vascular congestion.

## 2022-10-30 ENCOUNTER — Encounter: Payer: Self-pay | Admitting: Podiatry

## 2022-10-30 ENCOUNTER — Ambulatory Visit (INDEPENDENT_AMBULATORY_CARE_PROVIDER_SITE_OTHER): Payer: No Typology Code available for payment source | Admitting: Podiatry

## 2022-10-30 VITALS — BP 155/92 | HR 61

## 2022-10-30 DIAGNOSIS — M79674 Pain in right toe(s): Secondary | ICD-10-CM | POA: Diagnosis not present

## 2022-10-30 DIAGNOSIS — M79675 Pain in left toe(s): Secondary | ICD-10-CM | POA: Diagnosis not present

## 2022-10-30 DIAGNOSIS — B351 Tinea unguium: Secondary | ICD-10-CM | POA: Diagnosis not present

## 2022-10-30 NOTE — Progress Notes (Signed)
   Chief Complaint  Patient presents with   Nail Problem    "The big toe where he removed the nail.  I don't know if it's pain from that toenail or if it is Neuropathy.  The nail just kind of stopped growing."    SUBJECTIVE Patient presents to office today complaining of elongated, thickened nails that cause pain while ambulating in shoes.  Patient is unable to trim their own nails. Patient is here for further evaluation and treatment.  Past Medical History:  Diagnosis Date   Bipolar 1 disorder ([facility])    Cancer ([facility]) 20 years   skin   Gout    Heart murmur    Hypertension    Hypothyroidism    ITP (idiopathic thrombocytopenic purpura)    Migraines    Psoriasis     Allergies  Allergen Reactions   Codeine Other (See Comments)    hyperactive     OBJECTIVE General Patient is awake, alert, and oriented x 3 and in no acute distress. Derm Skin is dry and supple bilateral. Negative open lesions or macerations. Remaining integument unremarkable. Nails are tender, long, thickened and dystrophic with subungual debris, consistent with onychomycosis, 1-5 bilateral. No signs of infection noted. Vasc  DP and PT pedal pulses palpable bilaterally. Temperature gradient within normal limits.  Neuro Epicritic and protective threshold sensation grossly intact bilaterally.  Musculoskeletal Exam No symptomatic pedal deformities noted bilateral. Muscular strength within normal limits.  ASSESSMENT 1.  Pain due to onychomycosis of toenails both  PLAN OF CARE 1. Patient evaluated today.  2. Instructed to maintain good pedal hygiene and foot care.  3. Mechanical debridement of nails 1-5 bilaterally performed using a nail nipper. Filed with dremel without incident.  4. Return to clinic in 3 mos.    Felecia Shelling, DPM [facility]  Dr. Felecia Shelling, DPM    [address] [place], [place] [address]                Office [phone]  Fax [phone]

## 2023-01-31 ENCOUNTER — Ambulatory Visit: Payer: Medicare Other | Admitting: Podiatry

## 2023-04-30 ENCOUNTER — Ambulatory Visit: Payer: Medicare Other | Admitting: Podiatry
